# Patient Record
Sex: Female | Born: 1965 | Race: White | Hispanic: No | Smoking: Current every day smoker
Health system: Southern US, Community
[De-identification: ages and names within clinical notes are randomized; demographics above are authoritative.]

## PROBLEM LIST (undated history)

## (undated) ENCOUNTER — Emergency Department (HOSPITAL_COMMUNITY): Admission: EM | Payer: Self-pay | Source: Home / Self Care

## (undated) DIAGNOSIS — R0602 Shortness of breath: Secondary | ICD-10-CM

## (undated) DIAGNOSIS — M25519 Pain in unspecified shoulder: Secondary | ICD-10-CM

## (undated) DIAGNOSIS — J449 Chronic obstructive pulmonary disease, unspecified: Secondary | ICD-10-CM

## (undated) DIAGNOSIS — F32A Depression, unspecified: Secondary | ICD-10-CM

## (undated) DIAGNOSIS — K2970 Gastritis, unspecified, without bleeding: Secondary | ICD-10-CM

## (undated) DIAGNOSIS — N289 Disorder of kidney and ureter, unspecified: Secondary | ICD-10-CM

## (undated) DIAGNOSIS — K589 Irritable bowel syndrome without diarrhea: Secondary | ICD-10-CM

## (undated) DIAGNOSIS — I10 Essential (primary) hypertension: Secondary | ICD-10-CM

## (undated) DIAGNOSIS — IMO0002 Reserved for concepts with insufficient information to code with codable children: Secondary | ICD-10-CM

## (undated) DIAGNOSIS — N189 Chronic kidney disease, unspecified: Secondary | ICD-10-CM

## (undated) DIAGNOSIS — R569 Unspecified convulsions: Secondary | ICD-10-CM

## (undated) DIAGNOSIS — K219 Gastro-esophageal reflux disease without esophagitis: Secondary | ICD-10-CM

## (undated) DIAGNOSIS — K222 Esophageal obstruction: Secondary | ICD-10-CM

## (undated) DIAGNOSIS — K279 Peptic ulcer, site unspecified, unspecified as acute or chronic, without hemorrhage or perforation: Secondary | ICD-10-CM

## (undated) DIAGNOSIS — F419 Anxiety disorder, unspecified: Secondary | ICD-10-CM

## (undated) DIAGNOSIS — F329 Major depressive disorder, single episode, unspecified: Secondary | ICD-10-CM

## (undated) DIAGNOSIS — B9681 Helicobacter pylori [H. pylori] as the cause of diseases classified elsewhere: Secondary | ICD-10-CM

## (undated) HISTORY — PX: UPPER GASTROINTESTINAL ENDOSCOPY: SHX188

## (undated) HISTORY — DX: Chronic obstructive pulmonary disease, unspecified: J44.9

## (undated) HISTORY — DX: Peptic ulcer, site unspecified, unspecified as acute or chronic, without hemorrhage or perforation: K27.9

## (undated) HISTORY — DX: Gastritis, unspecified, without bleeding: K29.70

## (undated) HISTORY — DX: Major depressive disorder, single episode, unspecified: F32.9

## (undated) HISTORY — PX: ABDOMINAL HYSTERECTOMY: SHX81

## (undated) HISTORY — PX: CHOLECYSTECTOMY: SHX55

## (undated) HISTORY — DX: Gastro-esophageal reflux disease without esophagitis: K21.9

## (undated) HISTORY — DX: Esophageal obstruction: K22.2

## (undated) HISTORY — PX: OOPHORECTOMY: SHX86

## (undated) HISTORY — DX: Helicobacter pylori (H. pylori) as the cause of diseases classified elsewhere: B96.81

## (undated) HISTORY — PX: ESOPHAGOGASTRODUODENOSCOPY: SHX1529

## (undated) HISTORY — DX: Irritable bowel syndrome, unspecified: K58.9

## (undated) HISTORY — DX: Reserved for concepts with insufficient information to code with codable children: IMO0002

## (undated) HISTORY — DX: Anxiety disorder, unspecified: F41.9

## (undated) HISTORY — DX: Essential (primary) hypertension: I10

## (undated) HISTORY — DX: Depression, unspecified: F32.A

## (undated) HISTORY — DX: Pain in unspecified shoulder: M25.519

## (undated) HISTORY — PX: OTHER SURGICAL HISTORY: SHX169

## (undated) HISTORY — PX: BLADDER SURGERY: SHX569

---

## 2001-05-11 ENCOUNTER — Encounter: Payer: Self-pay | Admitting: General Surgery

## 2001-05-11 ENCOUNTER — Ambulatory Visit (HOSPITAL_COMMUNITY): Admission: RE | Admit: 2001-05-11 | Discharge: 2001-05-11 | Payer: Self-pay | Admitting: General Surgery

## 2001-10-30 ENCOUNTER — Encounter: Payer: Self-pay | Admitting: Emergency Medicine

## 2001-10-30 ENCOUNTER — Emergency Department (HOSPITAL_COMMUNITY): Admission: EM | Admit: 2001-10-30 | Discharge: 2001-10-30 | Payer: Self-pay | Admitting: Emergency Medicine

## 2005-06-14 ENCOUNTER — Inpatient Hospital Stay (HOSPITAL_COMMUNITY): Admission: EM | Admit: 2005-06-14 | Discharge: 2005-06-18 | Payer: Self-pay | Admitting: Emergency Medicine

## 2005-06-20 ENCOUNTER — Emergency Department (HOSPITAL_COMMUNITY): Admission: EM | Admit: 2005-06-20 | Discharge: 2005-06-20 | Payer: Self-pay | Admitting: Emergency Medicine

## 2006-08-10 ENCOUNTER — Emergency Department (HOSPITAL_COMMUNITY): Admission: EM | Admit: 2006-08-10 | Discharge: 2006-08-10 | Payer: Self-pay | Admitting: Emergency Medicine

## 2007-03-29 ENCOUNTER — Emergency Department (HOSPITAL_COMMUNITY): Admission: EM | Admit: 2007-03-29 | Discharge: 2007-03-29 | Payer: Self-pay | Admitting: Emergency Medicine

## 2008-01-06 ENCOUNTER — Emergency Department (HOSPITAL_COMMUNITY): Admission: EM | Admit: 2008-01-06 | Discharge: 2008-01-06 | Payer: Self-pay | Admitting: Emergency Medicine

## 2008-05-31 ENCOUNTER — Emergency Department (HOSPITAL_COMMUNITY): Admission: EM | Admit: 2008-05-31 | Discharge: 2008-05-31 | Payer: Self-pay | Admitting: Emergency Medicine

## 2009-02-02 ENCOUNTER — Emergency Department (HOSPITAL_COMMUNITY): Admission: EM | Admit: 2009-02-02 | Discharge: 2009-02-02 | Payer: Self-pay | Admitting: Emergency Medicine

## 2009-03-21 ENCOUNTER — Emergency Department (HOSPITAL_COMMUNITY): Admission: EM | Admit: 2009-03-21 | Discharge: 2009-03-21 | Payer: Self-pay | Admitting: Emergency Medicine

## 2009-04-20 ENCOUNTER — Emergency Department (HOSPITAL_COMMUNITY): Admission: EM | Admit: 2009-04-20 | Discharge: 2009-04-20 | Payer: Self-pay | Admitting: Emergency Medicine

## 2009-06-19 ENCOUNTER — Ambulatory Visit (HOSPITAL_COMMUNITY): Admission: RE | Admit: 2009-06-19 | Discharge: 2009-06-19 | Payer: Self-pay | Admitting: Family Medicine

## 2009-06-20 ENCOUNTER — Ambulatory Visit: Payer: Self-pay | Admitting: Orthopedic Surgery

## 2009-06-20 ENCOUNTER — Emergency Department (HOSPITAL_COMMUNITY): Admission: EM | Admit: 2009-06-20 | Discharge: 2009-06-20 | Payer: Self-pay | Admitting: Emergency Medicine

## 2009-06-20 DIAGNOSIS — Z8679 Personal history of other diseases of the circulatory system: Secondary | ICD-10-CM | POA: Insufficient documentation

## 2009-06-20 DIAGNOSIS — M7580 Other shoulder lesions, unspecified shoulder: Secondary | ICD-10-CM

## 2009-06-27 ENCOUNTER — Encounter: Payer: Self-pay | Admitting: Orthopedic Surgery

## 2009-06-29 ENCOUNTER — Telehealth: Payer: Self-pay | Admitting: Orthopedic Surgery

## 2009-09-07 ENCOUNTER — Ambulatory Visit: Payer: Self-pay | Admitting: Orthopedic Surgery

## 2009-09-07 ENCOUNTER — Emergency Department (HOSPITAL_COMMUNITY): Admission: EM | Admit: 2009-09-07 | Discharge: 2009-09-07 | Payer: Self-pay | Admitting: Emergency Medicine

## 2009-09-07 DIAGNOSIS — S139XXA Sprain of joints and ligaments of unspecified parts of neck, initial encounter: Secondary | ICD-10-CM

## 2009-10-04 DIAGNOSIS — B9681 Helicobacter pylori [H. pylori] as the cause of diseases classified elsewhere: Secondary | ICD-10-CM

## 2009-10-04 HISTORY — DX: Helicobacter pylori (H. pylori) as the cause of diseases classified elsewhere: B96.81

## 2009-10-09 ENCOUNTER — Ambulatory Visit: Payer: Self-pay | Admitting: Gastroenterology

## 2009-10-09 DIAGNOSIS — R1310 Dysphagia, unspecified: Secondary | ICD-10-CM

## 2009-10-09 DIAGNOSIS — Z8719 Personal history of other diseases of the digestive system: Secondary | ICD-10-CM | POA: Insufficient documentation

## 2009-10-09 DIAGNOSIS — Z8711 Personal history of peptic ulcer disease: Secondary | ICD-10-CM

## 2009-10-09 DIAGNOSIS — K219 Gastro-esophageal reflux disease without esophagitis: Secondary | ICD-10-CM

## 2009-10-19 ENCOUNTER — Ambulatory Visit (HOSPITAL_COMMUNITY): Admission: RE | Admit: 2009-10-19 | Discharge: 2009-10-19 | Payer: Self-pay | Admitting: Gastroenterology

## 2009-10-19 ENCOUNTER — Ambulatory Visit: Payer: Self-pay | Admitting: Gastroenterology

## 2009-10-26 ENCOUNTER — Ambulatory Visit (HOSPITAL_COMMUNITY): Admission: RE | Admit: 2009-10-26 | Discharge: 2009-10-26 | Payer: Self-pay | Admitting: Gastroenterology

## 2009-10-26 ENCOUNTER — Ambulatory Visit: Payer: Self-pay | Admitting: Gastroenterology

## 2009-11-23 ENCOUNTER — Ambulatory Visit (HOSPITAL_COMMUNITY): Admission: RE | Admit: 2009-11-23 | Discharge: 2009-11-23 | Payer: Self-pay | Admitting: Family Medicine

## 2009-11-23 ENCOUNTER — Encounter: Payer: Self-pay | Admitting: Orthopedic Surgery

## 2009-11-29 ENCOUNTER — Telehealth: Payer: Self-pay | Admitting: Orthopedic Surgery

## 2009-11-29 ENCOUNTER — Emergency Department (HOSPITAL_COMMUNITY): Admission: EM | Admit: 2009-11-29 | Discharge: 2009-11-29 | Payer: Self-pay | Admitting: Emergency Medicine

## 2009-12-05 ENCOUNTER — Emergency Department (HOSPITAL_COMMUNITY)
Admission: EM | Admit: 2009-12-05 | Discharge: 2009-12-05 | Payer: Self-pay | Source: Home / Self Care | Admitting: Emergency Medicine

## 2010-03-13 ENCOUNTER — Ambulatory Visit: Payer: Self-pay | Admitting: Gastroenterology

## 2010-03-13 DIAGNOSIS — R109 Unspecified abdominal pain: Secondary | ICD-10-CM | POA: Insufficient documentation

## 2010-03-14 ENCOUNTER — Telehealth (INDEPENDENT_AMBULATORY_CARE_PROVIDER_SITE_OTHER): Payer: Self-pay

## 2010-03-14 ENCOUNTER — Encounter: Payer: Self-pay | Admitting: Gastroenterology

## 2010-03-16 ENCOUNTER — Encounter: Payer: Self-pay | Admitting: Gastroenterology

## 2010-04-05 ENCOUNTER — Ambulatory Visit (HOSPITAL_COMMUNITY): Admission: RE | Admit: 2010-04-05 | Payer: Self-pay | Admitting: Gastroenterology

## 2010-05-27 ENCOUNTER — Encounter: Payer: Self-pay | Admitting: Internal Medicine

## 2010-06-05 ENCOUNTER — Encounter: Payer: Self-pay | Admitting: Internal Medicine

## 2010-06-05 ENCOUNTER — Ambulatory Visit
Admission: RE | Admit: 2010-06-05 | Discharge: 2010-06-05 | Payer: Self-pay | Source: Home / Self Care | Attending: Urgent Care | Admitting: Urgent Care

## 2010-06-05 DIAGNOSIS — R197 Diarrhea, unspecified: Secondary | ICD-10-CM | POA: Insufficient documentation

## 2010-06-07 NOTE — Assessment & Plan Note (Signed)
Summary: HAVING BLACK STOOLS/LAW   Visit Type:  Follow-up Visit Primary Care Provider:  Vertis Kelch  CC:  Black Stools.  History of Present Illness: Yolanda Adams is a pleasant 45 year old WF who presents in follow-up from an EGD/ED from June 2011. She had complaints of epigastric pain and dysphagia; EGD/ED was done 6/16 and 6/23, patchy erythema in entire body of stomach, no erosions, +H.pylori. Completed course of pylera, now on prilosec daily. denies dysphagia, denies reflux. continues to c/o upper diffuse abdominal pain intermittently, not associated or exacerbated by eating/drinking. "stabbing". Also continues to c/o postprandial loose, watery stools, which she states has gone on for "years". Refused TCS at last visit but now "is ready to try anything". Reported "black" stools. Was prescribed dicyclomine in the summer prior to egd but states "didn't help". requesting narcotics. was informed we would not dispense any. also concerned she has lost weight since june. down 6 lbs (from 168 to 162).    Current Medications (verified): 1)  Tramadol Hcl 50 Mg Tabs (Tramadol Hcl) .... One By Mouth Q 6 Hrs As Needed Pain 2)  Seroquel 100 Mg Tabs (Quetiapine Fumarate) .... Take 1 Tab By Mouth At Bedtime 3)  Alprazolam 1 Mg Tabs (Alprazolam) .... Take 1 Tablet By Mouth Four Times A Day 4)  Albuterol Inhaler .... As Directed 5)  Combivent 18-103 Mcg/act Aero (Ipratropium-Albuterol) .... As Directed 6)  Tylenol Arthritis Pain 650 Mg Cr-Tabs (Acetaminophen) .... As Needed 7)  Hydrochlorothiazide 25 Mg Tabs (Hydrochlorothiazide) .... 1/2 By Mouth Daily 8)  Prilosec Otc 20 Mg Tbec (Omeprazole Magnesium) .... One By Mouth 30 Mins Before Breakfast Daily 9)  Zoloft 50 Mg Tabs (Sertraline Hcl) .... Take 1 Tablet By Mouth Once A Day  Allergies (verified): No Known Drug Allergies  Past History:  Past Medical History: GERD H/O PUD Anxiety Depression Back pain/DDD COPD Shoulder pain Hypertension  6/16  and 6/23: EGD/ED: Distal esophageal stricture.  No evidence of Barrett's mass,     erosion, or ulceration.  The distal esophagus was dilated 15-16 mm.     The 17-mm dilator would not pass the upper esophagus without severe     resistance. 2. Patchy erythema in the entire body of the stomach.  Unable to     appreciate any erosions. 3. Normal duodenal bulb and second portion of the duodenum.  Review of Systems General:  Denies fever, chills, and anorexia. Eyes:  Denies blurring, diplopia, and discharge. ENT:  Denies sore throat, hoarseness, and difficulty swallowing. CV:  Denies chest pains, angina, and palpitations. Resp:  Denies dyspnea at rest, cough, and wheezing. GI:  Complains of abdominal pain, diarrhea, and black BMs; denies difficulty swallowing, pain on swallowing, and nausea. GU:  Denies urinary burning, blood in urine, and urinary frequency. MS:  Denies joint pain / LOM, joint swelling, and joint stiffness. Derm:  Denies rash, itching, and dry skin. Neuro:  Denies weakness, syncope, and frequent headaches. Psych:  Denies depression and anxiety. Endo:  Denies cold intolerance and heat intolerance.  Vital Signs:  Patient profile:   45 year old female Height:      62.5 inches Weight:      162 pounds BMI:     29.26 Pulse rate:   64 / minute BP sitting:   110 / 64  (left arm) Cuff size:   regular  Vitals Entered By: Cloria Spring LPN (March 13, 2010 2:24 PM)  Physical Exam  General:  Well developed, well nourished, no acute distress. Eyes:  sclera without icterus Mouth:  No deformity or lesions, dentition normal. Lungs:  Clear throughout to auscultation. Heart:  Regular rate and rhythm; no murmurs, rubs,  or bruits. Abdomen:  normal bowel sounds, obese, TTP upper abdomen, without guarding, without rebound, no masses, and no hepatomegally or splenomegaly.   Msk:  Symmetrical with no gross deformities. Normal posture. Extremities:  No clubbing, cyanosis, edema or  deformities noted. Neurologic:  Alert and  oriented x4;  grossly normal neurologically. Psych:  Alert and cooperative. Normal mood and affect.  Impression & Recommendations:  Problem # 1:  ABDOMINAL PAIN, UNSPECIFIED SITE (ICD-54.20)  45 year old WF who underwent EGD/ED secondary to dysphagia, epigastric pain in June 2011 on 6/16 and repeat dilation 6/23. +H.pylori, finished treatment. Denies dysphagia but continues to complain of diffuse upper abdominal pain intermittently, not associated or exacerbated by eating/drinking. "stabbing". unsure if relieved by BM. States comes without warning. Requested narcotics. Refused dicyclomine. Takes PPI reportedly daily.  No narcotics H.pylori stool antigen  Consider CT abd/pelvis   Orders: Est. Patient Level III (78469)  Problem # 2:  IRRITABLE BOWEL SYNDROME, HX OF (ICD-V12.79)  Hx of IBS, c/o pp loose, watery stools for "years". Refused TCS at last visit but agreeable now. Due to hx of difficult sedation, will have done in OR. Does not want to continue dicyclomine, states didn't work before. See #1 regarding narcotics.  TCS in OR with Dr. Darrick Penna, the risks/benefits have been discussed in detail and she states understanding, gave verbal consent.   Orders: Est. Patient Level III (62952)

## 2010-06-07 NOTE — Assessment & Plan Note (Signed)
Summary: EVAL/TREAT RT SHOULDER PAIN/NEEDS XRAY/REF D.ALLEN/100%MC DIS...   Visit Type:  Initial Consult Referring Provider:   [facility]  CC:  right shoulder pain.  History of Present Illness: I saw Yolanda Adams in the office today for an initial visit.  She is a 45 years old woman with the complaint of:  RIGHT SHOULDER PAIN.  Medications: Xanax, Serquel, Zantac, Tylenol Arthritis, Comvivent, Gliqelique.  Xrays at [facility] on 06-19-09.  x-rays were normal  She complains of previous dislocation of the RIGHT shoulder.  Complains of sharp 7 burning pain which is a 10 it is constant continuous she can't sleep on her RIGHT side.  She says nothing makes it better it's worse when she tries to move it  She did not circle symptoms of numbness tingling locking catching or swelling    Past History:  Past Medical History: Heartburn Anxiety Depression High blood pressure Peptic Ulcers Back pain COPD Shoulder pain  Past Surgical History: Cholecystectomy Right ovary removed  Review of Systems General:  Complains of fatigue; denies weight loss, weight gain, fever, and chills; easy bruising. Cardiac :  Complains of chest pain; denies angina, heart attack, heart failure, poor circulation, blood clots, and phlebitis. Resp:  Complains of short of breath; denies difficulty breathing, COPD, cough, and pneumonia; wheezing, coughing, snoring. GI:  Complains of nausea and diarrhea; denies vomiting, constipation, difficulty swallowing, ulcers, GERD, and reflux; heartburn. GU:  Denies kidney failure, kidney transplant, kidney stones, burning, poor stream, testicular cancer, blood in urine, and . Neuro:  Denies headache, dizziness, migraines, numbness, weakness, tremor, and unsteady walking. MS:  Complains of joint pain; denies rheumatoid arthritis, joint swelling, gout, bone cancer, osteoporosis, and ; muscle pain. Endo:  Denies thyroid disease, goiter, and diabetes. Psych:  Complains of depression  and anxiety; denies mood swings, panic attack, bipolar, and schizophrenia; nervousness. Derm:  Denies eczema, cancer, and itching. EENT:  Denies poor vision, cataracts, glaucoma, poor hearing, vertigo, ears ringing, sinusitis, hoarseness, toothaches, and bleeding gums. Immunology:  Denies seasonal allergies, sinus problems, and allergic to bee stings. Lymphatic:  Denies lymph node cancer and lymph edema.  Physical Exam  Additional Exam:  Vital signs are as recorded stable  Her appearance was normal  Cardiovascular exam no swelling or varicose veins pulsed in temperature normal no edema tenderness in the upper extremities  Cervical lymph node were normal  The patient was normal  RIGHT shoulder was extremely tender tender and hypersensitive.  She seemed to exacerbate her symptoms.  Her range of motion is limited to 45 of external rotation 80 of abduction and 40 of forward flexion.  I cannot test stability although sulcus sign testing normal her skin was intact multiple blockages that she said was from not going to the attending bed  Reflex recall in both elbows and wrists normal sensation is noted distally   Oriented to person place and time  Mood anxious   Impression & Recommendations:  Problem # 1:  IMPINGEMENT SYNDROME (ICD-726.2) Assessment New  x-rays were normal I saw all of them there were 3 of them  If anything she is exacerbating her symptoms but may have some bursitis or tendinitis in the rotator cuff  Recommend subacromial injection  Naprosyn  Physical therapy.  No followup needed.  Medications Added to Medication List This Visit: 1)  Naprosyn 500 Mg Tabs (Naproxen) .Marland Kitchen.. 1 by mouth two times a day  Other Orders: New Patient Level III (40981) Joint Aspirate / Injection, Large (20610) Depo- Medrol 40mg  (J1030)  Patient Instructions: 1)  You have received an injection of cortisone today. You may experience increased pain at the injection site. Apply ice  pack to the area for 20 minutes every 2 hours and take 2 xtra strength tylenol every 8 hours. This increased pain will usually resolve in 24 hours. The injection will take effect in 3-10 days.  2)  Physical Therapy for right shoulder bursitis and stiff shoulder  3)  the heaelth department will arrange f/u for your back pain and provide any other pain medication that you may need. 4)  Please schedule a follow-up appointment as needed. Prescriptions: NAPROSYN 500 MG TABS (NAPROXEN) 1 by mouth two times a day  #60 x 1   Entered and Authorized by:   Fuller Canada MD   Signed by:   Fuller Canada MD on 06/20/2009   Method used:   Print then Give to Patient   RxID:   [id number]   Appended Document: EVAL/TREAT RT SHOULDER PAIN/NEEDS XRAY/REF D.ALLEN/100%MC DIS... Verbal consent obtained/The shoulder was injected with depomedrol 40mg /cc and sensorcaine .25% . There were no complications

## 2010-06-07 NOTE — Assessment & Plan Note (Signed)
Summary: reck shoulder req injection may need new xr/mc disc/bsf   Visit Type:  Follow-up Referring Provider:   [facility]  CC:  recheck shoulder.  History of Present Illness: I saw Yolanda Adams in the office today for an initial visit.  She is a 45 years old woman with the complaint of:  RIGHT SHOULDER PAIN, Impingement syndrome  Medications: Xanax, Seroquel, Zantac, Tylenol Arthritis, Comvivent, Gliqelique, Prednisone for COPD, albuterol inhaler, Doxycycline for infection.  Xrays at [facility] on 06-19-09.  x-rays abnormal [facility] JOINT   She is here today requesting injection right shoulder.  Was noncompliant about therapy, did not go.  Naprosyn did not help.  Had injection 06/20/09 in our office.  data she seems to be exacerbating her symptoms but she complains more of neck pain now comes in with her RIGHT shoulder can't stop complaining of severe pain in the RIGHT shoulder with radiation into the RIGHT hand          Past History:  Past Medical History: Last updated: 06/20/2009 Heartburn Anxiety Depression High blood pressure Peptic Ulcers Back pain COPD Shoulder pain  Past Surgical History: Last updated: 06/20/2009 Cholecystectomy Right ovary removed  Physical Exam  Additional Exam:  tenderness in the cervical spine especially in the RIGHT side of it with trapezial tenderness decreased range of motion increased muscle tone is consistent with muscle spasm normal neurologic exam in the RIGHT upper extremity normal reflexes normal grip strength.     Impression & Recommendations:  Problem # 1:  IMPINGEMENT SYNDROME (ICD-726.2) Assessment Comment Only  xrays of the c spine mild spur ? spasm curvature looks pretty good not much dimension here on the x-ray there might be a spur mild joint spaces look good curve looks good impression mild spur otherwise normal cervical spine  Perhaps she has a disc problem perhaps a pinched nerve recommend Robaxin to work on the  spasm no pain medicines she really wanted a pain pill and we  declined  Orders: Est. Patient Level III (81191)  Problem # 2:  CERVICAL SPASM (ICD-847.0) Assessment: New  Her updated medication list for this problem includes:    Naprosyn 500 Mg Tabs (Naproxen) .Marland Kitchen... 1 by mouth two times a day    Tramadol Hcl 50 Mg Tabs (Tramadol hcl) ..... One by mouth q 6 hrs as needed pain    Robaxin 500 Mg Tabs (Methocarbamol) .Marland Kitchen... 1 q 6 as needed pain  Orders: Est. Patient Level III (47829) Cervical x-ray 2/3 views (56213)  Medications Added to Medication List This Visit: 1)  Robaxin 500 Mg Tabs (Methocarbamol) .Marland Kitchen.. 1 q 6 as needed pain  Patient Instructions: 1)  Take robaxin for pain  2)  use heat on the neck  3)  no follow up needed  Prescriptions: ROBAXIN 500 MG TABS (METHOCARBAMOL) 1 q 6 as needed pain  #60 x 2   Entered and Authorized by:   Fuller Canada MD   Signed by:   Fuller Canada MD on 09/07/2009   Method used:   Print then Give to Patient   RxID:   [id number]

## 2010-06-07 NOTE — Assessment & Plan Note (Signed)
Summary: hx of Peptic Ulcers-cdg   Visit Type:  Initial Consult Referring Provider:    Primary Care Provider:  Vertis Kelch  Chief Complaint:  heart burn/abd pain.  History of Present Illness: Yolanda Adams is a pleasant 45 y/o WF, who presents today for further evaluation of heartburn and abd pain. She states she had EGD years ago by Dr. Elpidio Anis and was found to have ulcers. She's been on Zantac off/on for years. She was told to avoid ASA/NSAIDS. Over the past two months, she has increasing epigastric pain. She finds holding a pillow on her stomach helps. She c/o pain associated with eating greasy or spicey foods. She c/o intractable heartburn. At night, she sleeps sitting up. She has problems swallowing solids foods and c/o odynophagia. She c/o pp loose stools which she has had for years. Used to take levsin in past but cannot afford. Denies brbpr. ?black stools lately. She takes ultram for her back. Used to be on percocet before Dr. Katrinka Blazing left town.   Current Medications (verified): 1)  Tramadol Hcl 50 Mg Tabs (Tramadol Hcl) .... One By Mouth Q 6 Hrs As Needed Pain 2)  Seroquel 100 Mg Tabs (Quetiapine Fumarate) .... Take 1 Tab By Mouth At Bedtime 3)  Alprazolam 1 Mg Tabs (Alprazolam) .... Take 1 Tablet By Mouth Four Times A Day 4)  Albuterol Inhaler .... As Directed 5)  Combivent 18-103 Mcg/act Aero (Ipratropium-Albuterol) .... As Directed 6)  Zantac 150 Mg Tabs (Ranitidine Hcl) .... Take 1 Tablet By Mouth Two Times A Day 7)  Tylenol Arthritis Pain 650 Mg Cr-Tabs (Acetaminophen) .... As Needed 8)  Hydrochlorothiazide 25 Mg Tabs (Hydrochlorothiazide) .... 1/2 By Mouth Daily  Allergies (verified): No Known Drug Allergies  Past History:  Past Medical History: GERD H/O PUD Anxiety Depression Back pain/DDD COPD Shoulder pain Hypertension  Family History: Father, PUD, partial gastrectomy No FH of CRC, liver disease  Social History: Single. Lives with Father. Trying to get  disability. Grown dgt, age 59. 1/2ppd (down from 2ppd two years ago). No alcohol.   Review of Systems General:  Denies fever, chills, sweats, anorexia, fatigue, and weight loss. Eyes:  Denies vision loss. ENT:  Complains of difficulty swallowing; denies nasal congestion, sore throat, and hoarseness. CV:  Complains of dyspnea on exertion; denies chest pains, angina, palpitations, and peripheral edema. Resp:  Complains of dyspnea with exercise and cough; denies dyspnea at rest, sputum, and wheezing. GI:  See HPI. GU:  Denies urinary burning, blood in urine, and abnormal vaginal bleeding. MS:  Complains of low back pain. Derm:  Denies rash and itching. Neuro:  Denies weakness, frequent headaches, memory loss, and confusion. Psych:  Complains of anxiety; denies depression and suicidal ideation. Endo:  Denies unusual weight change. Heme:  Denies bruising and bleeding. Allergy:  Denies hives and rash.  Vital Signs:  Patient profile:   45 year old female Height:      62.5 inches Weight:      168 pounds BMI:     30.35 Temp:     97.4 degrees F oral Pulse rate:   96 / minute BP sitting:   120 / 78  (left arm) Cuff size:   regular  Vitals Entered By: Cloria Spring LPN (October 10, 8655 11:12 AM)  Physical Exam  General:  Well developed, well nourished, no acute distress. Head:  Normocephalic and atraumatic. Eyes:  Conjunctivae pink, no scleral icterus.  Mouth:  Oropharyngeal mucosa moist, pink.  No lesions, erythema or exudate.  Neck:  Supple; no masses or thyromegaly. Lungs:  Clear throughout to auscultation. Heart:  Regular rate and rhythm; no murmurs, rubs,  or bruits. Abdomen:  Soft. Obese. Positive BS. Mild diffuse upper abd tenderness. No rebound or guarding. No HSM or masses. No abd bruit or hernia. Extremities:  No clubbing, cyanosis, edema or deformities noted. Neurologic:  Alert and  oriented x4;  grossly normal neurologically. Skin:  Intact without significant lesions or  rashes. Cervical Nodes:  No significant cervical adenopathy. Psych:  anxious.    Impression & Recommendations:  Problem # 1:  EPIGASTRIC PAIN (ICD-789.06)  Two month h/o worsening epigatric pain worse with certain foods. Also with intractable heartburn, dysphagia, odynophagia. H/O remote PUD, ?secondary to NSAIDS/ASA before. Stop Zantac. Begin Prilosec OTC 20mg  by mouth daily, #20 given. EGD in OR in near future. Patient with h/o inadequate conscious sedation with prior EGD, polypharmacy, high anxiety level. EGD/ED to be performed in near future.  Risks, alternatives, benefits including but not limited to risk of reaction to medications, bleeding, infection, and perforation addressed.  Patient voiced understanding and verbal consent obtained.   Patient asked for "pain medication" for her stomach pain. She mentioned not being about to get narcotic from the Health Dept[facility]ental Health. I advised her that we would start PPI and plan for EGD. No narcotic to be given.  Orders: New Patient Level III (16109)  Problem # 2:  IRRITABLE BOWEL SYNDROME, HX OF (ICD-V12.79)  Begin dicyclomine 10mg  by mouth three times a day (qac) as needed. Offered TCS, patient declines at this point. If symptoms do not improved with antispasmotic, recommend further w/u now. Patient agreeable.   Orders: New Patient Level III (60454)  Patient Instructions: 1)  Stop Zantac. 2)  Start Prilosec OTC one by mouth 30 mins before breakfast daily. #20 samples given to you today. 3)  Start dicyclomine 10mg  by mouth three times a day ( before meals) as needed loose stool, abdominal cramps. 4)  EGD (upper endoscopy) as scheduled. 5)  The medication list was reviewed and reconciled.  All changed / newly prescribed medications were explained.  A complete medication list was provided to the patient / caregiver. Prescriptions: DICYCLOMINE HCL 10 MG CAPS (DICYCLOMINE HCL) one by mouth three times a day (qac) as needed abd pain,  loose stools  #90 x 3   Entered and Authorized by:   Leanna Battles. Dixon Boos   Signed by:   Leanna Battles Quiana Cobaugh PA-C on 10/09/2009   Method used:   Electronically to        [facility] (retail)       [address]       [place], [place]  [address]       Ph: [phone]       Fax: [phone]   RxID:   [id number]   Appended Document: hx of Peptic Ulcers-cdg Agree w/ no narcotics.

## 2010-06-07 NOTE — Progress Notes (Signed)
Summary: tramadol called into [facility], pt advised  Phone Note Call from Patient   Summary of Call: Yolanda Adams (01-Nov-2065) says the Napsosyn is not helping the pain, wants to know if you will call in Tramadol or something else for the pain. Uses [facility] in [place] [facility] # [phone] Initial call taken by: Jacklynn Ganong,  June 29, 2009 1:34 PM  Follow-up for Phone Call        tramadol ok  Follow-up by: Fuller Canada MD,  June 30, 2009 7:37 AM    New/Updated Medications: TRAMADOL HCL 50 MG TABS (TRAMADOL HCL) one by mouth q 6 hrs as needed pain Prescriptions: TRAMADOL HCL 50 MG TABS (TRAMADOL HCL) one by mouth q 6 hrs as needed pain  #42 x 1   Entered by:   Ether Griffins   Authorized by:   Fuller Canada MD   Signed by:   Ether Griffins on 06/30/2009   Method used:   Faxed to ...       [facility] (retail)       [address]       [place], [place]  [address]       Ph: [phone]       Fax: [phone]   RxID:   [id number]

## 2010-06-07 NOTE — Letter (Signed)
Summary: TCS ORDER  TCS ORDER   Imported By: Ave Filter 03/13/2010 15:58:41  _____________________________________________________________________  External Attachment:    Type:   Image     Comment:   External Document  Appended Document: TCS ORDER Pt NO SHOWED for pre-op visit on 04/04/10. I called pt to remind her of her appt. She stated she would like to wait to have her procedure because she isn't feeling well. She will call us back to reschedule.

## 2010-06-07 NOTE — Miscellaneous (Signed)
Summary: PT order  PT order   Imported By: Cammie Sickle 08/16/2009 09:18:21  _____________________________________________________________________  External Attachment:    Type:   Image     Comment:   External Document

## 2010-06-07 NOTE — Letter (Signed)
Summary: EGD/ED ORDER  EGD/ED ORDER   Imported By: Ave Filter 10/19/2009 10:57:25  _____________________________________________________________________  External Attachment:    Type:   Image     Comment:   External Document

## 2010-06-07 NOTE — Letter (Signed)
Summary: EGD ORDER  EGD ORDER   Imported By: Ave Filter 10/09/2009 12:03:27  _____________________________________________________________________  External Attachment:    Type:   Image     Comment:   External Document

## 2010-06-07 NOTE — Progress Notes (Signed)
Summary: h pylori stool orders  ---- Converted from flag ---- ---- 03/13/2010 4:16 PM, Gerrit Halls NP wrote: Please have pt obtain stool sample for H.pylori stool antigen test.. I forgot to mention this to her at the office visit. ------------------------------ pt aware, will come to office to pick up order and container. Explained to pt the specimen needed to be frozen. order and container at front desk. pt stated understanding  Appended Document: h pylori stool orders pt never picked up stool container  Appended Document: h pylori stool orders pt called- wants to come by and pick up container for hpylori stool. left container at front desk.

## 2010-06-07 NOTE — Letter (Signed)
Summary: *Orthopedic Consult Note  [facility]  [address] [place] [address]  [place], [place] [address]   Phone: [phone]  Fax: [phone]    Re:    Yolanda Adams DOB:    Dec 26, 1965   Dear: providers at the health department   Thank you for requesting that we see the above patient for consultation.  A copy of the detailed office note will be sent under separate cover, for your review.  Evaluation today is consistent with: exacerbation of symptoms, possible bursitis, seeking pain medicine   Our recommendation is for: no narcotics, physical therapy, subacromial injection.  She is not a surgical candidate and does not have a surgical lesion.  If she needs treatment for her back pain please send her to either a chiropractor or neurosurgeon.   Thank you for this opportunity to look after your patient.  Sincerely,   Terrance Mass. MD.

## 2010-06-07 NOTE — Medication Information (Signed)
Summary: Tax adviser   Imported By: Cammie Sickle 08/21/2009 06:32:23  _____________________________________________________________________  External Attachment:    Type:   Image     Comment:   External Document

## 2010-06-07 NOTE — Letter (Signed)
Summary: Historic Patient File  Historic Patient File   Imported By: Elvera Maria 06/27/2009 11:26:09  _____________________________________________________________________  External Attachment:    Type:   Image     Comment:   history form

## 2010-06-07 NOTE — Letter (Signed)
Summary: *Orthopedic No Show Letter  [facility]  [address]  [place], [place] [address]   Phone: [phone]  Fax: [phone]      11/23/2009   Yolanda Adams [address] [place], [place]  [address]     Dear Ms. Jobson,   Our records indicate that you missed your scheduled appointment with Dr. Beaulah Corin on 11/22/09.  Please contact this office to reschedule your appointment as soon as possible.  It is important that you keep your scheduled appointments with your physician, in order to avoid any potential missed appointment charge, and so that we can provide you the best care possible.        Sincerely,    Dr. Terrance Mass, MD [facility] Phone [phone]

## 2010-06-07 NOTE — Miscellaneous (Signed)
Summary: Orders Update  Clinical Lists Changes  Orders: Added new Test order of T-Helicobactor Pylori Antigen Stool ([id number]) - Signed 

## 2010-06-07 NOTE — Letter (Signed)
Summary: AUTH FOR RELEASE OF MED INFO  AUTH FOR RELEASE OF MED INFO   Imported By: Rexene Alberts 03/16/2010 10:28:39  _____________________________________________________________________  External Attachment:    Type:   Image     Comment:   External Document

## 2010-06-07 NOTE — Progress Notes (Signed)
Summary: called patient to cancel appt, referred back to primary care  Phone Note Outgoing Call   Call placed to: Patient Summary of Call: Per Dr Romeo Apple and per last office note and per patient instruction sheet, patient is to follow up with [facility] Department for back/hip pain.  I have called patient, advised and cancelled the appointment for today. Initial call taken by: Cammie Sickle,  November 29, 2009 10:13 AM

## 2010-06-08 ENCOUNTER — Encounter (HOSPITAL_COMMUNITY): Payer: Self-pay | Attending: Internal Medicine

## 2010-06-08 ENCOUNTER — Other Ambulatory Visit: Payer: Self-pay | Admitting: Internal Medicine

## 2010-06-08 DIAGNOSIS — Z01812 Encounter for preprocedural laboratory examination: Secondary | ICD-10-CM | POA: Insufficient documentation

## 2010-06-08 LAB — BASIC METABOLIC PANEL
BUN: 6 mg/dL (ref 6–23)
CO2: 22 mEq/L (ref 19–32)
Calcium: 9.2 mg/dL (ref 8.4–10.5)
Chloride: 102 mEq/L (ref 96–112)
Creatinine, Ser: 0.83 mg/dL (ref 0.4–1.2)
GFR calc Af Amer: >60 mL/min (ref >60)
GFR calc non Af Amer: >60 mL/min (ref >60)
Glucose, Bld: 106 mg/dL — ABNORMAL HIGH (ref 70–99)
Potassium: 4.3 mEq/L (ref 3.5–5.1)
Sodium: 134 mEq/L — ABNORMAL LOW (ref 135–145)

## 2010-06-08 LAB — HCG, QUANTITATIVE, PREGNANCY: hCG, Beta Chain, Quant, S: 2 m[IU]/mL (ref <5)

## 2010-06-13 NOTE — Letter (Signed)
Summary: TCS ORDER  TCS ORDER   Imported By: Ave Filter 06/05/2010 12:14:50  _____________________________________________________________________  External Attachment:    Type:   Image     Comment:   External Document

## 2010-06-13 NOTE — Assessment & Plan Note (Signed)
Summary: PT TO SET UP TCS/CM   Visit Type:  Follow-up Visit Primary Care Provider:  Dr. Raliegh Scarlet [facility], Michell Heinrich)  Chief Complaint:  set up tcs and having some black specks in stool.  History of Present Illness: cc:  Vertis Kelch, NP  45 y/o caucasian female here to set up colonoscopy that was recommended previously and she admits to putting off due to other bladder problems.  c/o watery diarrhea, especially in AM and at night.  Seeing dark stools .  Hx IBS.  Taking PEPTO as needed.  c/o intermittant abd pain all over entire abd.  s/p cholecystectomy & right oopherectomy.  Diarrhea worse postprandially.  BM 5 per day. Pain worse with eating greasy or spicy foods. Failed dicyclomine previously.  Occ heartburn & epigastric pressure.  Preventive Screening-Counseling & Management      Drug Use:  no.    Current Problems (verified): 1)  Abdominal Pain, Unspecified Site  (ICD-789.00) 2)  Dysphagia Unspecified  (ICD-787.20) 3)  Irritable Bowel Syndrome, Hx of  (ICD-V12.79) 4)  Gerd  (ICD-530.81) 5)  Pud, Hx of  (ICD-V12.71) 6)  Cervical Spasm  (ICD-847.0) 7)  Impingement Syndrome  (ICD-726.2) 8)  High Blood Pressure  (ICD-V12.50)  Current Medications (verified): 1)  Tramadol Hcl 50 Mg Tabs (Tramadol Hcl) .... One By Mouth Q 6 Hrs As Needed Pain 2)  Seroquel 100 Mg Tabs (Quetiapine Fumarate) .... Take 1 Tab By Mouth At Bedtime 3)  Alprazolam 1 Mg Tabs (Alprazolam) .... Take 1 Tablet By Mouth Four Times A Day 4)  Albuterol Inhaler .... As Directed 5)  Combivent 18-103 Mcg/act Aero (Ipratropium-Albuterol) .... As Directed 6)  Tylenol Arthritis Pain 650 Mg Cr-Tabs (Acetaminophen) .... As Needed 7)  Hydrochlorothiazide 25 Mg Tabs (Hydrochlorothiazide) .... 1/2 By Mouth Daily 8)  Prilosec Otc 20 Mg Tbec (Omeprazole Magnesium) .... One By Mouth 30 Mins Before Breakfast Daily 9)  Zoloft 50 Mg Tabs (Sertraline Hcl) .... Take 1 Tablet By Mouth Once A Day 10)  Oxybutynin Chloride 5 Mg Tabs  (Oxybutynin Chloride) .... Three Times A Day 11)  Pyridium 100 Mg Tabs (Phenazopyridine Hcl) .... Three Times A Day  Allergies (verified): 1)  ! * Fluminine  Past History:  Past Medical History: Last updated: 03/13/2010 GERD H/O PUD Anxiety Depression Back pain/DDD COPD Shoulder pain Hypertension  6/16 and 6/23: EGD/ED: Distal esophageal stricture.  No evidence of Barrett's mass,     erosion, or ulceration.  The distal esophagus was dilated 15-16 mm.     The 17-mm dilator would not pass the upper esophagus without severe     resistance. 2. Patchy erythema in the entire body of the stomach.  Unable to     appreciate any erosions. 3. Normal duodenal bulb and second portion of the duodenum.  Past Surgical History: Cholecystectomy Right ovary removed 3 bladder surgeries Dr Lafayette Dragon in [place]  Family History: Reviewed history from 10/09/2009 and no changes required. Father (24) PUD, partial gastrectomy, prostate ca mother (74) healthy 4 siblings, 1 deceased MVA, Bipolar No FH of CRC, liver disease  Social History: Reviewed history from 10/09/2009 and no changes required. Single. Lives with Father. Trying to get disability. Grown dgt, age 67-healthy. 1/2ppd (down from 2ppd two years ago) x 27yrs. No alcohol.  Illicit Drug Use - no Drug Use:  no  Review of Systems General:  Complains of fatigue, weakness, and malaise; denies fever, chills, sweats, anorexia, and weight loss. ENT:  Complains of hoarseness; denies earache, ear discharge, tinnitus, decreased hearing,  nasal congestion, loss of smell, nosebleeds, sore throat, and difficulty swallowing; for several months. CV:  Denies chest pains, angina, palpitations, syncope, dyspnea on exertion, orthopnea, PND, peripheral edema, and claudication. Resp:  Denies dyspnea at rest, dyspnea with exercise, cough, sputum, wheezing, coughing up blood, and pleurisy. GI:  See HPI; Denies difficulty swallowing, pain on swallowing, vomiting  blood, and jaundice. GU:  Complains of urinary burning and urinary frequency; denies blood in urine, nocturnal urination, urinary incontinence, abnormal vaginal bleeding, amenorrhea, menorrhagia, vaginal discharge, pelvic pain, genital sores, painful intercourse, and decreased libido; recent bladder surgeries. MS:  Denies joint pain / LOM, joint swelling, joint stiffness, joint deformity, low back pain, muscle weakness, muscle cramps, muscle atrophy, leg pain at night, leg pain with exertion, and shoulder pain / LOM hand / wrist pain (CTS). Derm:  Denies rash, itching, dry skin, hives, moles, warts, and unhealing ulcers. Psych:  Denies depression, anxiety, memory loss, suicidal ideation, hallucinations, paranoia, phobia, and confusion. Heme:  Denies bruising, bleeding, and enlarged lymph nodes.  Vital Signs:  Patient profile:   45 year old female Height:      62.5 inches Weight:      161 pounds BMI:     29.08 Temp:     97.4 degrees F oral Pulse rate:   68 / minute BP sitting:   120 / 76  (left arm) Cuff size:   regular  Vitals Entered By: Hendricks Limes LPN (June 05, 2010 11:29 AM)  Physical Exam  General:  Well developed, well nourished, no acute distress. Head:  Normocephalic and atraumatic. Eyes:  Sclera clear, no icterus. Ears:  Normal auditory acuity. Nose:  No deformity, discharge,  or lesions. Mouth:  No deformity or lesions, dentition normal. +hoarse Neck:  Supple; no masses or thyromegaly. Lungs:  Clear throughout to auscultation. Heart:  Regular rate and rhythm; no murmurs, rubs,  or bruits. Abdomen:  Soft, nontender and nondistended. No masses, hepatosplenomegaly or hernias noted. Normal bowel sounds.without guarding and without rebound.   Msk:  Symmetrical with no gross deformities. Normal posture. Pulses:  Normal pulses noted. Extremities:  No clubbing, cyanosis, edema or deformities noted. Neurologic:  Alert and  oriented x4;  grossly normal neurologically. Skin:   Intact without significant lesions or rashes. Cervical Nodes:  No significant cervical adenopathy. Psych:  Alert and cooperative. Normal mood and affect.  Impression & Recommendations:  Problem # 1:  DIARRHEA, CHRONIC (ICD-787.91) 45 y/o caucasian female w/ chronic diarrhea and hx IBS. Previously tried dicyclomine, no relief.  Continues to have diarrhea & ready to have colonoscopy for further evaluation to r/o IBD, microscopic colitis, less likely colorectal ca.    Diagnostic colonoscopy to be performed by Dr. Suszanne Conners Rourk in the near future in the OR given hx polypharmacy.  I have discussed risks and benefits which include, but are not limited to, bleeding, infection, perforation, or medication reaction.  The patient agrees with this plan and consent will be obtained.    Orders: Est. Patient Level IV (56213)  Problem # 2:  IRRITABLE BOWEL SYNDROME, HX OF (ICD-V12.79) See #1  Problem # 3:  GERD (ICD-530.81) On prilosec daily, occasional breakthrough symptoms  Appended Document: PT TO SET UP TCS/CM LM for return call.   Need to know pt's LMP? Using contraception? Needs Urine Preg if has not permanent birth control prior to TCS. Thanks  Appended Document: PT TO SET UP TCS/CM spoke with pt- she has had 1 ovary removed only. Her LMP was last week. Informed  pt she will need urine pregnancy test and I would see if they will do it tomorrow at her pre-op appt. pt verbalized understanding. added urine pregnancy to orders and faxed to day surgery.

## 2010-06-14 ENCOUNTER — Encounter (INDEPENDENT_AMBULATORY_CARE_PROVIDER_SITE_OTHER): Payer: Self-pay | Admitting: *Deleted

## 2010-06-14 ENCOUNTER — Encounter: Payer: Self-pay | Admitting: Internal Medicine

## 2010-06-14 ENCOUNTER — Ambulatory Visit (HOSPITAL_COMMUNITY): Admission: RE | Admit: 2010-06-14 | Payer: Self-pay | Source: Ambulatory Visit | Admitting: Internal Medicine

## 2010-06-21 NOTE — Letter (Signed)
Summary: Radiology Test Reminder  [facility]  [address]   [place], [place] [address]   Phone: [phone]  Fax: [phone]     June 14, 2010   LYRIQUE HAKIM [address] [place], [place]  [address] 1965/10/02  Dear Ms. Kunz,  During your last appointment, your doctor requested you have Colonoscopy.  Our records indicate you have not had this done.  Remember it is very important to follow your doctor's instructions.  Please have this done as soon as possible.  If you have questions regarding this appointment, please call our office and we can reschedule this for you.  It is important that patients and their doctor work together in the management and treatment of their health care.  If you have already had your test done, please disregard this letter.  Thank you,    Ave Filter  [facility] Ph: [phone]   Fax: [phone]

## 2010-07-22 LAB — BASIC METABOLIC PANEL
BUN: 12 mg/dL (ref 6–23)
GFR calc Af Amer: >60 mL/min (ref >60)

## 2010-08-08 LAB — URINALYSIS, ROUTINE W REFLEX MICROSCOPIC
Bilirubin Urine: NEGATIVE
Hgb urine dipstick: NEGATIVE
Specific Gravity, Urine: 1.02 (ref 1.005–1.030)
pH: 5 (ref 5.0–8.0)

## 2010-08-08 LAB — URINE MICROSCOPIC-ADD ON

## 2010-08-20 LAB — RAPID URINE DRUG SCREEN, HOSP PERFORMED
Amphetamines: NOT DETECTED
Barbiturates: NOT DETECTED
Benzodiazepines: POSITIVE — AB
Opiates: NOT DETECTED

## 2010-09-21 NOTE — Consult Note (Signed)
Yolanda Adams, Yolanda Adams               ACCOUNT NO.:  [id number]   MEDICAL RECORD NO.:  [id number]          PATIENT TYPE:  INP   LOCATION:  A325                          FACILITY:  [facility]   PHYSICIAN:  Dennie Maizes, M.D.   DATE OF BIRTH:  Feb 05, 1966   DATE OF CONSULTATION:  DATE OF DISCHARGE:                                   CONSULTATION   REASON FOR CONSULTATION:  Acute right pyelonephritis.   HISTORY OF PRESENT ILLNESS:  This 45 year old female experienced sudden  onset of severe right flank pain as well as right upper quadrant abdominal  pain five days ago.  She denied having any fever, chills, voiding difficulty  at that time.  She had nausea.  She has urinary frequency x10-12 and  nocturia x2, usually.  She has not noticed any blood in the urine.   PAST MEDICAL HISTORY:  1.  Negative for urolithiasis.  2.  Chronic recurrent back pain due to degenerative disk disease.  3.  Insomnia with anxiety.  4.  Status post cholecystectomy.  5.  Status post excision of right ovary.   MEDICATIONS:  The patient had been started on IV Levaquin.   ALLERGIES:  None.   PHYSICAL EXAMINATION:  GENERAL APPEARANCE:  The patient is comfortable at  the time of my examination.  ABDOMEN:  Soft, no palpable flank mass.  Mild right flank, as well as, right  upper quadrant abdominal tenderness is noted.  Bladder not palpable.  No  suprapubic tenderness.   ADMISSION LABORATORY DATA:  CBC:  WBC 11.0, hemoglobin 13.3, hematocrit  38.8.  BUN 5, creatinine 0.9.  Urine pregnancy test negative.  Urinalysis:  Blood negative, nitrites positive, leukocyte esterase negative, WBC's 11-20  per high-powered field, bacteria many.  Urine culture and sensitivity has  been done.  Results are pending.  The patient is on IV Levaquin at present.   STUDIES:  The patient has been evaluated with CT of the abdomen and pelvis  without contrast initially.  This revealed small liver cysts.  Bilateral  renal scarring with  nonobstructing small stones in the upper poles of both  kidneys.  There was a subtle hyperechoic right renal  mass.  There was no  evidence of hydronephrosis or uretal calculi.  The hyperechoic mass in the  right kidney was evaluated further with MRI of the abdomen with and without  contrast.  MRI revealed 15 mm size non-enhancing lesion in the right kidney  suggestive of acute pyelonephritis.  This corresponds to the area of  abnormality noted on the CT scan.  Edema of the kidney with perinephric  fluid.  Appearance was suggestive of acute focal pyelonephritis and small  renal abscess.   IMPRESSION:  1.  Acute focal right pyelonephritis, possible small right renal abscess.      The patient is responding well clinically to IV Levaquin.  Continue IV      Levaquin.  2.  Await urine culture and sensitivity results.  3.  The patient can be discharged when she is pain free and clinically      stable.  Thanks for this  consult.  We will follow the patient in the      office in one week.      Dennie Maizes, M.D.  Electronically Signed     SK/MEDQ  D:  06/17/2005  T:  06/17/2005  Job:  [id number]   cc:   Madelin Rear. Sherwood Gambler, MD  Fax: [phone]

## 2010-09-21 NOTE — Discharge Summary (Signed)
[name], Yolanda Adams               ACCOUNT NO.:  [id number]   MEDICAL RECORD NO.:  [id number]          PATIENT TYPE:  INP   LOCATION:  A325                          FACILITY:  [facility]   PHYSICIAN:  Madelin Rear. Sherwood Gambler, MD  DATE OF BIRTH:  Sep 21, 1965   DATE OF ADMISSION:  06/14/2005  DATE OF DISCHARGE:  02/13/2007LH                                 DISCHARGE SUMMARY   DISCHARGE DIAGNOSES:  1.  Acute pyelonephritis.  2.  Back pain.  3.  Chronic back ache.   DISCHARGE MEDICATIONS:  1.  Xanax 1 mg four times a day.  2.  Tylox p.r.n. pain.  3.  Levaquin 750 mg p.o. daily.   HOSPITAL COURSE:  The patient was admitted with acute pyelonephritis.  She  was seen in consultation with urology when a CT revealed a questionable  renal abscess.  She was evaluated by urology for urinary incontinence and  followup as an outpatient would be for that.  Subsequently, the patient was  discharged after defervescence in office for follow up of her urinary tract  infection.      Madelin Rear. Sherwood Gambler, MD  Electronically Signed     LJF/MEDQ  D:  07/23/2005  T:  07/23/2005  Job:  [id number]

## 2010-09-21 NOTE — H&P (Signed)
Yolanda, Adams               ACCOUNT NO.:  [id number]   MEDICAL RECORD NO.:  [id number]          PATIENT TYPE:  EMS   LOCATION:  ED                            FACILITY:  [facility]   PHYSICIAN:  Madelin Rear. Sherwood Gambler, MD  DATE OF BIRTH:  [birth year]   DATE OF ADMISSION:  06/14/2005  DATE OF DISCHARGE:  LH                                HISTORY & PHYSICAL   CHIEF COMPLAINT:  Right upper quadrant pain.   HISTORY OF PRESENT ILLNESS:  The patient had sudden severe onset of right  upper quadrant and right sided flank pain.  She admitted to nausea but no  vomiting.  No fevers, rigors, or chills.  No frequency, urgency, dysuria, or  hematuria noted.   PAST MEDICAL HISTORY:  1.  Chronic recurrent back pain.  2.  Insomnia with anxiety.  3.  Status post cholecystectomy.   SOCIAL HISTORY:  Nonsmoker, nondrinker.  No alcohol or other drug use.   She is maintained on chronic opiate therapy for back pain.   FAMILY HISTORY:  Positive for coronary artery disease but otherwise  unremarkable.   REVIEW OF SYSTEMS:  As under HPI, all else negative.   PHYSICAL EXAMINATION:  SKIN:  Unremarkable.  HEAD/NECK:  No JVD or adenopathy.  Neck is supple.  CHEST:  Clear.  CARDIAC:  Regular rhythm without murmur, gallop, or rub.  Positive right  lower rib cage tenderness but much more marked in the right upper quadrant.  ABDOMEN:  There was no organomegaly or masses.  No guarding or rebound  tenderness.   LABORATORY:  Revealed a 11,000 white count, normal H&H, platelet count  normal.  Urinalysis positive for pyuria.  A CT scan was obtained and  reviewed with the radiologist real time.  There was a vague density in her  right kidney, upper pole that was not clearly delineated on the CT and will  require further assessment as outlined below.  Much more significantly,  there was no hydronephrosis or hydroureter.  No evidence of a renal  infarction at present.  There was no biliary tract dilatation or  pancreatic  or duodenal disease.  No evidence of perforated viscus.   IMPRESSION:  1.  Right flank pain, question etiology secondary to pyelonephritis.  We      will admit for IV antibiotics and      analgesia.  2.  Right kidney mass.  We will reassess with a MR as the optimal imaging      technique.  3.  Back pain.  We will address that with expectant observation and      intervene as needed.      Madelin Rear. Sherwood Gambler, MD  Electronically Signed     LJF/MEDQ  D:  06/14/2005  T:  06/14/2005  Job:  [id number]

## 2010-10-05 ENCOUNTER — Emergency Department (HOSPITAL_COMMUNITY)
Admission: EM | Admit: 2010-10-05 | Discharge: 2010-10-05 | Disposition: A | Discharge status: Home / Self Care | Payer: Self-pay | Attending: Emergency Medicine | Admitting: Emergency Medicine

## 2010-10-05 DIAGNOSIS — J4489 Other specified chronic obstructive pulmonary disease: Secondary | ICD-10-CM | POA: Insufficient documentation

## 2010-10-05 DIAGNOSIS — M545 Low back pain, unspecified: Secondary | ICD-10-CM | POA: Insufficient documentation

## 2010-10-05 DIAGNOSIS — J449 Chronic obstructive pulmonary disease, unspecified: Secondary | ICD-10-CM | POA: Insufficient documentation

## 2010-10-05 DIAGNOSIS — Z79899 Other long term (current) drug therapy: Secondary | ICD-10-CM | POA: Insufficient documentation

## 2010-10-05 DIAGNOSIS — I10 Essential (primary) hypertension: Secondary | ICD-10-CM | POA: Insufficient documentation

## 2010-11-27 ENCOUNTER — Telehealth: Payer: Self-pay

## 2010-11-27 NOTE — Telephone Encounter (Signed)
Pt called and said she has transportation problems. She lives in [place]. Dr. Teena Dunk is a new GI physician there and she would like a referral. She said Dr. Darrick Penna is a wonderful physician, but she has transportation issues and it would be so much easier for her to travel there. She said Dr. Starr Lake office will not see her unless she is referred From Dr.Fields.

## 2010-11-27 NOTE — Telephone Encounter (Signed)
Please fax records from 2011 and 2012 to Dr. Starr Lake ofc ASAP. Transfer of care ok.  Please call Ms. Weyenberg and let her know we will be faxing her records. We will be happy to continue her care until her first appt with Dr. Teena Dunk.

## 2010-11-27 NOTE — Telephone Encounter (Signed)
Pt informed. Darl Pikes working on the records.

## 2010-11-29 ENCOUNTER — Telehealth: Payer: Self-pay | Admitting: Gastroenterology

## 2010-11-30 NOTE — Telephone Encounter (Signed)
Will await form and fax to Dr. Starr Lake ofc.

## 2010-11-30 NOTE — Telephone Encounter (Signed)
Spoke with [employer]. Pt needs referral form filled out. Gave her my fax #: [phone].

## 2010-12-07 ENCOUNTER — Other Ambulatory Visit: Payer: Self-pay | Admitting: Neurosurgery

## 2010-12-07 DIAGNOSIS — M47816 Spondylosis without myelopathy or radiculopathy, lumbar region: Secondary | ICD-10-CM

## 2010-12-11 ENCOUNTER — Inpatient Hospital Stay: Admission: RE | Admit: 2010-12-11 | Payer: Self-pay | Source: Ambulatory Visit

## 2010-12-12 NOTE — Telephone Encounter (Signed)
Cc our records To Dr. Ottie Glazier office and Patient now has appt scheduled with there office for 12/27/10 @ 2:15

## 2010-12-12 NOTE — Telephone Encounter (Signed)
REFERRAL AND PAPERWORK FAXED.

## 2011-01-09 ENCOUNTER — Ambulatory Visit
Admission: RE | Admit: 2011-01-09 | Discharge: 2011-01-09 | Disposition: A | Discharge status: Home / Self Care | Payer: Self-pay | Source: Ambulatory Visit | Attending: Neurosurgery | Admitting: Neurosurgery

## 2011-01-09 DIAGNOSIS — M47816 Spondylosis without myelopathy or radiculopathy, lumbar region: Secondary | ICD-10-CM

## 2011-01-09 MED ORDER — IOHEXOL 180 MG/ML  SOLN
1.0000 mL | Freq: Once | INTRAMUSCULAR | Status: AC | PRN
  Administered 2011-01-09: 1 mL via EPIDURAL

## 2011-01-09 MED ORDER — METHYLPREDNISOLONE ACETATE 40 MG/ML INJ SUSP (RADIOLOG
120.0000 mg | Freq: Once | INTRAMUSCULAR | Status: AC
  Administered 2011-01-09: 120 mg via EPIDURAL

## 2011-01-29 ENCOUNTER — Encounter (HOSPITAL_COMMUNITY): Payer: Self-pay

## 2011-01-29 ENCOUNTER — Emergency Department (HOSPITAL_COMMUNITY)
Admission: EM | Admit: 2011-01-29 | Discharge: 2011-01-29 | Disposition: A | Discharge status: Home / Self Care | Payer: Self-pay | Attending: Emergency Medicine | Admitting: Emergency Medicine

## 2011-01-29 DIAGNOSIS — F17200 Nicotine dependence, unspecified, uncomplicated: Secondary | ICD-10-CM | POA: Insufficient documentation

## 2011-01-29 DIAGNOSIS — F411 Generalized anxiety disorder: Secondary | ICD-10-CM | POA: Insufficient documentation

## 2011-01-29 DIAGNOSIS — K219 Gastro-esophageal reflux disease without esophagitis: Secondary | ICD-10-CM | POA: Insufficient documentation

## 2011-01-29 DIAGNOSIS — N949 Unspecified condition associated with female genital organs and menstrual cycle: Secondary | ICD-10-CM | POA: Insufficient documentation

## 2011-01-29 DIAGNOSIS — R102 Pelvic and perineal pain: Secondary | ICD-10-CM

## 2011-01-29 DIAGNOSIS — B9689 Other specified bacterial agents as the cause of diseases classified elsewhere: Secondary | ICD-10-CM | POA: Insufficient documentation

## 2011-01-29 DIAGNOSIS — F329 Major depressive disorder, single episode, unspecified: Secondary | ICD-10-CM | POA: Insufficient documentation

## 2011-01-29 DIAGNOSIS — I10 Essential (primary) hypertension: Secondary | ICD-10-CM | POA: Insufficient documentation

## 2011-01-29 DIAGNOSIS — J449 Chronic obstructive pulmonary disease, unspecified: Secondary | ICD-10-CM | POA: Insufficient documentation

## 2011-01-29 DIAGNOSIS — IMO0002 Reserved for concepts with insufficient information to code with codable children: Secondary | ICD-10-CM | POA: Insufficient documentation

## 2011-01-29 DIAGNOSIS — K279 Peptic ulcer, site unspecified, unspecified as acute or chronic, without hemorrhage or perforation: Secondary | ICD-10-CM | POA: Insufficient documentation

## 2011-01-29 DIAGNOSIS — J4489 Other specified chronic obstructive pulmonary disease: Secondary | ICD-10-CM | POA: Insufficient documentation

## 2011-01-29 DIAGNOSIS — N760 Acute vaginitis: Secondary | ICD-10-CM | POA: Insufficient documentation

## 2011-01-29 DIAGNOSIS — F3289 Other specified depressive episodes: Secondary | ICD-10-CM | POA: Insufficient documentation

## 2011-01-29 DIAGNOSIS — A599 Trichomoniasis, unspecified: Secondary | ICD-10-CM | POA: Insufficient documentation

## 2011-01-29 DIAGNOSIS — A499 Bacterial infection, unspecified: Secondary | ICD-10-CM | POA: Insufficient documentation

## 2011-01-29 LAB — COMPREHENSIVE METABOLIC PANEL
AST: 12 U/L (ref 0–37)
BUN: 10 mg/dL (ref 6–23)
CO2: 27 mEq/L (ref 19–32)
Chloride: 100 mEq/L (ref 96–112)
Creatinine, Ser: 0.71 mg/dL (ref 0.50–1.10)
GFR calc non Af Amer: >60 mL/min (ref >60)
Glucose, Bld: 105 mg/dL — ABNORMAL HIGH (ref 70–99)
Total Bilirubin: 0.4 mg/dL (ref 0.3–1.2)

## 2011-01-29 LAB — DIFFERENTIAL
Lymphocytes Relative: 25 % (ref 12–46)
Lymphs Abs: 2 10*3/uL (ref 0.7–4.0)
Monocytes Absolute: 0.5 10*3/uL (ref 0.1–1.0)
Monocytes Relative: 6 % (ref 3–12)
Neutro Abs: 5.3 10*3/uL (ref 1.7–7.7)

## 2011-01-29 LAB — URINALYSIS, ROUTINE W REFLEX MICROSCOPIC
Hgb urine dipstick: NEGATIVE
Protein, ur: NEGATIVE mg/dL
Urobilinogen, UA: 0.2 mg/dL (ref 0.0–1.0)

## 2011-01-29 LAB — CBC
HCT: 41 % (ref 36.0–46.0)
Hemoglobin: 13.7 g/dL (ref 12.0–15.0)
WBC: 8 10*3/uL (ref 4.0–10.5)

## 2011-01-29 LAB — WET PREP, GENITAL: Yeast Wet Prep HPF POC: NONE SEEN

## 2011-01-29 MED ORDER — AZITHROMYCIN 1 G PO PACK
1.0000 g | PACK | Freq: Once | ORAL | Status: DC
  Filled 2011-01-29: qty 1

## 2011-01-29 MED ORDER — CEFTRIAXONE SODIUM 250 MG IJ SOLR
250.0000 mg | Freq: Once | INTRAMUSCULAR | Status: AC
  Administered 2011-01-29: 250 mg via INTRAMUSCULAR
  Filled 2011-01-29: qty 250

## 2011-01-29 MED ORDER — HYDROMORPHONE HCL 2 MG/ML IJ SOLN
1.0000 mg | Freq: Once | INTRAMUSCULAR | Status: AC
  Administered 2011-01-29: 1 mg via INTRAVENOUS
  Filled 2011-01-29: qty 1

## 2011-01-29 MED ORDER — ONDANSETRON HCL 4 MG/2ML IJ SOLN
4.0000 mg | Freq: Once | INTRAMUSCULAR | Status: AC
  Administered 2011-01-29: 4 mg via INTRAVENOUS
  Filled 2011-01-29: qty 2

## 2011-01-29 MED ORDER — OXYCODONE-ACETAMINOPHEN 5-325 MG PO TABS: 1.0000 | ORAL_TABLET | ORAL | Status: AC | PRN

## 2011-01-29 MED ORDER — AZITHROMYCIN 250 MG PO TABS
1000.0000 mg | ORAL_TABLET | Freq: Once | ORAL | Status: AC
  Administered 2011-01-29: 1000 mg via ORAL
  Filled 2011-01-29: qty 4

## 2011-01-29 MED ORDER — HYDROMORPHONE HCL 2 MG/ML IJ SOLN
1.0000 mg | Freq: Once | INTRAMUSCULAR | Status: AC
  Administered 2011-01-29: 12:00:00 via INTRAVENOUS
  Filled 2011-01-29: qty 1

## 2011-01-29 MED ORDER — LIDOCAINE HCL (PF) 1 % IJ SOLN
2.0000 mL | Freq: Once | INTRAMUSCULAR | Status: AC
  Administered 2011-01-29: 2 mL
  Filled 2011-01-29: qty 5

## 2011-01-29 MED ORDER — METRONIDAZOLE 500 MG PO TABS: 500.0000 mg | ORAL_TABLET | Freq: Two times a day (BID) | ORAL | Status: AC

## 2011-01-29 MED ORDER — SODIUM CHLORIDE 0.9 % IV SOLN: Freq: Once | INTRAVENOUS | Status: DC

## 2011-01-29 NOTE — ED Notes (Signed)
Ice chips given to patient. Pt still c/o worsening pain in her lower abdomen. Dr. Ignacia Palma notified and aware. IV infusing with no edema or redness.

## 2011-01-29 NOTE — ED Notes (Signed)
Pelvic exam done by Dr. Ignacia Palma. Pt states that her pain is getting worse again. Dr. Ignacia Palma aware.

## 2011-01-29 NOTE — ED Provider Notes (Signed)
History   Chart scribed for Carleene Cooper III, MD by Enos Fling; the patient was seen in room APA05/APA05; this patient's care was started at 11:41 AM.    CSN: [id number] Arrival date & time: 01/29/2011 10:48 AM  Chief Complaint  Patient presents with  . Abdominal Pain  . Vaginal Bleeding   HPI Yolanda Adams is a 45 y.o. female who presents to the Emergency Department complaining of vaginal bleeding. Pt c/o heavy vaginal bleeding persistent x 2 weeks with associated lower abd cramping, bleeding worse today with passing of clots. Also reports dizziness with standing today. LNMP July. Pt denies fever, chills, n/v/d, back pain, flank pain, or urinary complaints. Pt had recent bladder sling surgery and has had low abd pain since then.   Past Medical History  Diagnosis Date  . GERD (gastroesophageal reflux disease)   . PUD (peptic ulcer disease)   . Anxiety and depression   . DDD (degenerative disc disease)   . COPD (chronic obstructive pulmonary disease)   . HTN (hypertension)     Past Surgical History  Procedure Date  . Cholecystectomy   . Bladder surgery     x 3 in [place]  . Oophorectomy     Right  . Esophagogastroduodenoscopy     no barrett's mass,erosion, ulceration,distal esophagus was dilated 15-16 mm,nl duodenal bulb  bladder sling  Family History  Problem Relation Age of Onset  . Prostate cancer Father     Partial gastrectomy    History  Substance Use Topics  . Smoking status: Current Everyday Smoker  . Smokeless tobacco: Not on file  . Alcohol Use: No  smokes 0.5 ppd. No alcohol or drug use.  OB History    Grav Para Term Preterm Abortions TAB SAB Ect Mult Living                   Review of Systems 10 Systems reviewed and are negative for acute change except as noted in the HPI.   Allergies  Review of patient's allergies indicates no known allergies.  Home Medications   Current Outpatient Rx  Name Route Sig Dispense Refill  . ACETAMINOPHEN 650  MG PO TBCR Oral Take 650 mg by mouth every 8 (eight) hours as needed.      . ALBUTEROL SULFATE HFA 108 (90 BASE) MCG/ACT IN AERS Inhalation Inhale 2 puffs into the lungs every 6 (six) hours as needed. Shortness of breath     . IPRATROPIUM-ALBUTEROL 18-103 MCG/ACT IN AERO Inhalation Inhale 2 puffs into the lungs every 6 (six) hours as needed.      . ALPRAZOLAM 1 MG PO TABS Oral Take 1 mg by mouth at bedtime as needed.      Marland Kitchen CITALOPRAM HYDROBROMIDE 20 MG PO TABS Oral Take 20 mg by mouth at bedtime.      Marland Kitchen GABAPENTIN 300 MG PO CAPS Oral Take 300 mg by mouth daily.      Marland Kitchen HYDROCHLOROTHIAZIDE 25 MG PO TABS Oral Take 25 mg by mouth daily.      Marland Kitchen OMEPRAZOLE 20 MG PO CPDR Oral Take 20 mg by mouth daily.      . OXYBUTYNIN CHLORIDE 5 MG PO TABS Oral Take 5 mg by mouth 3 (three) times daily.      . QUETIAPINE FUMARATE 100 MG PO TABS Oral Take 100 mg by mouth at bedtime.      . TRAMADOL HCL 50 MG PO TABS Oral Take 50 mg by mouth every 6 (six) hours  as needed.      . ALBUTEROL SULFATE (2.5 MG/3ML) 0.083% IN NEBU Nebulization Take 2.5 mg by nebulization every 6 (six) hours as needed.      Marland Kitchen PHENAZOPYRIDINE HCL 100 MG PO TABS Oral Take 100 mg by mouth 3 (three) times daily as needed.      . SERTRALINE HCL 50 MG PO TABS Oral Take 50 mg by mouth daily.        Physical Exam    BP 137/82  Pulse 82  Temp(Src) 97.5 F (36.4 C) (Oral)  Resp 18  Ht 5\' 2"  (1.575 m)  Wt 160 lb (72.576 kg)  BMI 29.26 kg/m2  SpO2 99%  LMP 01/29/2011  Physical Exam  Nursing note and vitals reviewed. Constitutional: She is oriented to person, place, and time. She appears well-developed and well-nourished. No distress.  HENT:  Head: Normocephalic.  Mouth/Throat: Oropharynx is clear and moist and mucous membranes are normal.  Eyes: Conjunctivae are normal. Pupils are equal, round, and reactive to light.  Neck: Normal range of motion. Neck supple.  Cardiovascular: Normal rate, regular rhythm and intact distal pulses.  Exam  reveals no gallop and no friction rub.   No murmur heard. Pulmonary/Chest: Effort normal and breath sounds normal. She has no wheezes. She has no rales.  Abdominal: Soft. Bowel sounds are normal. She exhibits no mass. There is tenderness (suprapubic). There is no rebound and no guarding.  Genitourinary: Vaginal discharge (white in color) found.       Pelvic exam, chaperone present, blood and white discharge present in vaginal vault, diffuse tenderness  Musculoskeletal: Normal range of motion. She exhibits no edema and no tenderness.  Neurological: She is alert and oriented to person, place, and time. She exhibits normal muscle tone. Coordination normal.  Skin: Skin is warm and dry. No rash noted.  Psychiatric: She has a normal mood and affect.    ED Course  Procedures - none  OTHER DATA REVIEWED: Nursing notes and vital signs reviewed. Prior records reviewed. 3:18 PM Lab workup showed both trichomoniasis and bacterial vaginosis.  Will Rx with metronidazole 500 mg bid x 7 days, with Percocet every 4 hours if needed for pain.  Will also Rx today with Rocephin and Zirthromax to cover for potential STD.  LABS / RADIOLOGY: Results for orders placed during the hospital encounter of 01/29/11  CBC      Component Value Range   WBC 8.0  4.0 - 10.5 (K/uL)   RBC 4.35  3.87 - 5.11 (MIL/uL)   Hemoglobin 13.7  12.0 - 15.0 (g/dL)   HCT 16.1  09.6 - 04.5 (%)   MCV 94.3  78.0 - 100.0 (fL)   MCH 31.5  26.0 - 34.0 (pg)   MCHC 33.4  30.0 - 36.0 (g/dL)   RDW 40.9  81.1 - 91.4 (%)   Platelets 275  150 - 400 (K/uL)  DIFFERENTIAL      Component Value Range   Neutrophils Relative 67  43 - 77 (%)   Neutro Abs 5.3  1.7 - 7.7 (K/uL)   Lymphocytes Relative 25  12 - 46 (%)   Lymphs Abs 2.0  0.7 - 4.0 (K/uL)   Monocytes Relative 6  3 - 12 (%)   Monocytes Absolute 0.5  0.1 - 1.0 (K/uL)   Eosinophils Relative 2  0 - 5 (%)   Eosinophils Absolute 0.1  0.0 - 0.7 (K/uL)   Basophils Relative 1  0 - 1 (%)    Basophils Absolute 0.0  0.0 - 0.1 (K/uL)  COMPREHENSIVE METABOLIC PANEL      Component Value Range   Sodium 136  135 - 145 (mEq/L)   Potassium 4.1  3.5 - 5.1 (mEq/L)   Chloride 100  96 - 112 (mEq/L)   CO2 27  19 - 32 (mEq/L)   Glucose, Bld 105 (*) 70 - 99 (mg/dL)   BUN 10  6 - 23 (mg/dL)   Creatinine, Ser 4.09  0.50 - 1.10 (mg/dL)   Calcium 8.9  8.4 - 81.1 (mg/dL)   Total Protein 7.6  6.0 - 8.3 (g/dL)   Albumin 3.7  3.5 - 5.2 (g/dL)   AST 12  0 - 37 (U/L)   ALT 13  0 - 35 (U/L)   Alkaline Phosphatase 80  39 - 117 (U/L)   Total Bilirubin 0.4  0.3 - 1.2 (mg/dL)   GFR calc non Af Amer >60  >60 (mL/min)   GFR calc Af Amer >60  >60 (mL/min)  URINALYSIS, ROUTINE W REFLEX MICROSCOPIC      Component Value Range   Color, Urine STRAW (*) YELLOW    Appearance CLEAR  CLEAR    Specific Gravity, Urine <1.005 (*) 1.005 - 1.030    pH 6.5  5.0 - 8.0    Glucose, UA NEGATIVE  NEGATIVE (mg/dL)   Hgb urine dipstick NEGATIVE  NEGATIVE    Bilirubin Urine NEGATIVE  NEGATIVE    Ketones, ur NEGATIVE  NEGATIVE (mg/dL)   Protein, ur NEGATIVE  NEGATIVE (mg/dL)   Urobilinogen, UA 0.2  0.0 - 1.0 (mg/dL)   Nitrite NEGATIVE  NEGATIVE    Leukocytes, UA NEGATIVE  NEGATIVE        ED COURSE: 1:35 PM  EDP performed pelvic exam.  All results reviewed and discussed, questions answered, pt agreeable with plan.   IMPRESSION: No diagnosis found.    MEDS GIVEN IN ED:  Medications  0.9 %  sodium chloride infusion (not administered)  citalopram (CELEXA) 20 MG tablet (not administered)  albuterol (PROVENTIL HFA;VENTOLIN HFA) 108 (90 BASE) MCG/ACT inhaler (not administered)  gabapentin (NEURONTIN) 300 MG capsule (not administered)  HYDROmorphone (DILAUDID) injection 1 mg ( mg Intravenous Given 01/29/11 1155)  ondansetron (ZOFRAN) injection 4 mg (4 mg Intravenous Given 01/29/11 1156)     DISCHARGE MEDICATIONS: New Prescriptions   No medications on file     SCRIBE ATTESTATION:  I personally  performed the services described in this documentation, which was scribed in my presence. The recorded information has been reviewed and considered. Osvaldo Human, MD        Carleene Cooper III, MD 01/29/11 [phone]

## 2011-01-29 NOTE — ED Notes (Signed)
Pt c/o vaginal bleeding since September 8. Pt states that she skipped her period in August. C/o lower abdominal pain since having a bladder sling surgery. States that she has to walk bent over due to the pain. Alert and oriented x 3. Skin warm and dry. Color pink. Breath sounds clear and equal bilaterally.

## 2011-01-29 NOTE — ED Notes (Signed)
Pt reports lower abd pain, vaginal bleeding since 09/09.  Pt reports heavy bleeding, with large clots.  Pt states "it looks like liver or something".

## 2011-01-30 LAB — GC/CHLAMYDIA PROBE AMP, GENITAL: GC Probe Amp, Genital: NEGATIVE

## 2011-02-06 LAB — URINALYSIS, ROUTINE W REFLEX MICROSCOPIC
Glucose, UA: NEGATIVE
Protein, ur: NEGATIVE
Urobilinogen, UA: 0.2

## 2011-02-06 LAB — URINE MICROSCOPIC-ADD ON

## 2011-03-05 ENCOUNTER — Ambulatory Visit (INDEPENDENT_AMBULATORY_CARE_PROVIDER_SITE_OTHER): Payer: Self-pay | Admitting: Urology

## 2011-03-05 DIAGNOSIS — N3010 Interstitial cystitis (chronic) without hematuria: Secondary | ICD-10-CM

## 2011-03-05 DIAGNOSIS — R39198 Other difficulties with micturition: Secondary | ICD-10-CM

## 2011-03-05 DIAGNOSIS — N949 Unspecified condition associated with female genital organs and menstrual cycle: Secondary | ICD-10-CM

## 2011-03-15 ENCOUNTER — Telehealth: Payer: Self-pay | Admitting: Gastroenterology

## 2011-03-15 NOTE — Telephone Encounter (Signed)
Pt called wanting to make OV ASAP with SF so she can have a TCS done. I told pt that she had transferred her GI care to Dr Teena Dunk in [place] and that's where her records went.  She is not happy with Dr Teena Dunk and wants to come back to [facility]. Please advise if I need to make OV or not. Pt can be reached at [phone]

## 2011-03-18 ENCOUNTER — Telehealth: Payer: Self-pay

## 2011-03-18 NOTE — Telephone Encounter (Signed)
OK TO SCHEDULE 1ST AVAILABLE 30 MINUTE APPT.

## 2011-03-18 NOTE — Telephone Encounter (Signed)
Pt called and wanted to know if she could come in to someone before Thursday this week but she has been seeing the GI doctor in [place]. I told her I will look into this and call her back at [phone]. She has been here in the past please look at phone note from 03/15/11 also.

## 2011-03-18 NOTE — Telephone Encounter (Signed)
Susan-please schedule in first 30 minute appt.

## 2011-03-19 ENCOUNTER — Encounter: Payer: Self-pay | Admitting: Internal Medicine

## 2011-03-19 NOTE — Telephone Encounter (Signed)
Pt aware of OV with [facility]

## 2011-03-19 NOTE — Telephone Encounter (Signed)
Pt aware of OV on 11/14 @0930  with [facility]

## 2011-03-20 ENCOUNTER — Encounter: Payer: Self-pay | Admitting: Gastroenterology

## 2011-03-20 ENCOUNTER — Ambulatory Visit (INDEPENDENT_AMBULATORY_CARE_PROVIDER_SITE_OTHER): Payer: Self-pay | Admitting: Gastroenterology

## 2011-03-20 VITALS — BP 142/83 | HR 84 | Temp 98.0°F | Ht 62.0 in | Wt 171.4 lb

## 2011-03-20 DIAGNOSIS — R1310 Dysphagia, unspecified: Secondary | ICD-10-CM

## 2011-03-20 DIAGNOSIS — Z8719 Personal history of other diseases of the digestive system: Secondary | ICD-10-CM

## 2011-03-20 DIAGNOSIS — K921 Melena: Secondary | ICD-10-CM

## 2011-03-20 DIAGNOSIS — K219 Gastro-esophageal reflux disease without esophagitis: Secondary | ICD-10-CM

## 2011-03-20 DIAGNOSIS — R109 Unspecified abdominal pain: Secondary | ICD-10-CM

## 2011-03-20 DIAGNOSIS — R197 Diarrhea, unspecified: Secondary | ICD-10-CM

## 2011-03-20 DIAGNOSIS — R1314 Dysphagia, pharyngoesophageal phase: Secondary | ICD-10-CM

## 2011-03-20 NOTE — Progress Notes (Signed)
Primary Care Physician:  MUSE,ROCHELLE D., PA, PA-C  Primary Gastroenterologist:  Jonette Eva, MD   Chief Complaint  Patient presents with  . Colonoscopy    HPI:  Yolanda Adams is a 45 y.o. female here to schedule colonoscopy. Since we last saw her she transferred her care to Dr. Teena Dunk in [place], [place]. She states she wants to resume her care here because she liked our office but had to previously changed care due to transportation issues. Taking Prilosec but having bad heartburn at night. Has to sit up to sleep. Having trouble swallowing at nighttime. Chronic cough and hoarseness. Not having trouble swallowing with each meal. Epigastric pain/ruq/Lower abd pain as well for several weeks. Fecal incontinence with coughing. Bad over the last couple of months. BM pp urgency within 5-10 minutes. 5-10 stools daily. Stools are always watery to loose. Black stool. No brbpr. Abdominal pain not necessarily related to meals. Nocturnal diarrhea with urgency as well.   States she never saw all Dr. Teena Dunk for an official office visit stating that she left after he slammed door, yelled at her.   Current Outpatient Prescriptions  Medication Sig Dispense Refill  . acetaminophen (TYLENOL) 650 MG CR tablet Take 650 mg by mouth every 8 (eight) hours as needed.        Marland Kitchen albuterol (PROVENTIL HFA;VENTOLIN HFA) 108 (90 BASE) MCG/ACT inhaler Inhale 2 puffs into the lungs every 6 (six) hours as needed. Shortness of breath       . albuterol (PROVENTIL) (2.5 MG/3ML) 0.083% nebulizer solution Take 2.5 mg by nebulization every 6 (six) hours as needed.        Marland Kitchen albuterol-ipratropium (COMBIVENT) 18-103 MCG/ACT inhaler Inhale 2 puffs into the lungs every 6 (six) hours as needed.        . ALPRAZolam (XANAX) 1 MG tablet Take 1 mg by mouth at bedtime as needed.        . citalopram (CELEXA) 20 MG tablet Take 20 mg by mouth at bedtime.        . gabapentin (NEURONTIN) 300 MG capsule Take 300 mg by mouth daily.        .  hydrochlorothiazide 25 MG tablet Take 25 mg by mouth daily.        Marland Kitchen omeprazole (PRILOSEC) 20 MG capsule Take 20 mg by mouth daily.        . QUEtiapine (SEROQUEL) 100 MG tablet Take 100 mg by mouth at bedtime.        . traMADol (ULTRAM) 50 MG tablet Take 50 mg by mouth every 6 (six) hours as needed.        Marland Kitchen oxybutynin (DITROPAN) 5 MG tablet Take 5 mg by mouth 3 (three) times daily.        . phenazopyridine (PYRIDIUM) 100 MG tablet Take 100 mg by mouth 3 (three) times daily as needed.        . sertraline (ZOLOFT) 50 MG tablet Take 50 mg by mouth daily.          Allergies as of 03/20/2011  . (No Known Allergies)    Past Medical History  Diagnosis Date  . GERD (gastroesophageal reflux disease)   . PUD (peptic ulcer disease)   . Anxiety and depression   . DDD (degenerative disc disease)   . COPD (chronic obstructive pulmonary disease)   . HTN (hypertension)   . Shoulder pain   . IBS (irritable bowel syndrome)   . Peptic stricture of esophagus     dilated twice 10/2009  .  Helicobacter pylori gastritis June 2011    treated with Pylera    Past Surgical History  Procedure Date  . Cholecystectomy   . Bladder surgery     x 3 in [place]  . Oophorectomy     Right  . Esophagogastroduodenoscopy 10/26/09    Distal esophageal stricture. no barrett's mass,erosion, ulceration,distal esophagus was dilated 15-16 mm. A 17 mm dilator would not pass the upper sulcus without severe resistance. Patchy erythema in the entire body of the stomach. Biopsy showed H. pylori and she underwent treatment with Pylera,nl duodenal bulb    Family History  Problem Relation Age of Onset  . Prostate cancer Father     Partial gastrectomy  . Colon cancer Neg Hx     History   Social History  . Marital Status: Widowed    Spouse Name: N/A    Number of Children: 1  . Years of Education: N/A   Occupational History  .     Social History Main Topics  . Smoking status: Current Everyday Smoker -- 0.5 packs/day     Types: Cigarettes  . Smokeless tobacco: Not on file  . Alcohol Use: No  . Drug Use: No  . Sexually Active: Not on file   Other Topics Concern  . Not on file   Social History Narrative  . No narrative on file      ROS:  General: Negative for weight loss, fever, chills, fatigue, weakness. Positive for anorexia. Eyes: Negative for vision changes.  ENT: Negative for hoarseness,  nasal congestion. Positive for dysphagia. CV: Negative for chest pain, angina, palpitations, dyspnea on exertion, peripheral edema.  Respiratory: Negative for dyspnea at rest, dyspnea on exertion, cough, sputum, wheezing.  GI: See history of present illness. GU:  Negative for dysuria, hematuria, urinary incontinence, nocturnal urination. Positive for urinary frequency.  MS: Negative for joint pain, low back pain.  Derm: Negative for rash or itching.  Neuro: Negative for weakness, abnormal sensation, seizure, frequent headaches, memory loss, confusion.  Psych: Negative for anxiety, depression, suicidal ideation, hallucinations.  Endo: Negative for unusual weight change.  Heme: Negative for bruising or bleeding. Allergy: Negative for rash or hives.    Physical Examination:  BP 142/83  Pulse 84  Temp(Src) 98 F (36.7 C) (Temporal)  Ht 5\' 2"  (1.575 m)  Wt 171 lb 6.4 oz (77.747 kg)  BMI 31.35 kg/m2  LMP 02/25/2011   General: Well-nourished, well-developed in no acute distress.  Head: Normocephalic, atraumatic.   Eyes: Conjunctiva pink, no icterus. Mouth: Oropharyngeal mucosa moist and pink , no lesions erythema or exudate. Neck: Supple without thyromegaly, masses, or lymphadenopathy.  Lungs: Clear to auscultation bilaterally.  Heart: Regular rate and rhythm, no murmurs rubs or gallops.  Abdomen: Bowel sounds are normal, tender in the right abdomen/right upper quadrant., nondistended, no hepatosplenomegaly or masses, no abdominal bruits or    hernia , no rebound or guarding.   Rectal: Not  performed. Extremities: No lower extremity edema. No clubbing or deformities.  Neuro: Alert and oriented x 4 , grossly normal neurologically.  Skin: Warm and dry, no rash or jaundice.   Psych: Alert and cooperative, normal mood and affect.

## 2011-03-21 LAB — CBC WITH DIFFERENTIAL/PLATELET
Basophils Absolute: 0 10*3/uL (ref 0.0–0.1)
Basophils Relative: 1 % (ref 0–1)
Eosinophils Relative: 2 % (ref 0–5)
HCT: 36.4 % (ref 36.0–46.0)
MCH: 30.3 pg (ref 26.0–34.0)
MCHC: 31.6 g/dL (ref 30.0–36.0)
MCV: 95.8 fL (ref 78.0–100.0)
Monocytes Absolute: 0.4 10*3/uL (ref 0.1–1.0)
RDW: 13.6 % (ref 11.5–15.5)

## 2011-03-21 LAB — COMPREHENSIVE METABOLIC PANEL
AST: 13 U/L (ref 0–37)
Alkaline Phosphatase: 62 U/L (ref 39–117)
BUN: 8 mg/dL (ref 6–23)
Calcium: 9.4 mg/dL (ref 8.4–10.5)
Creat: 0.88 mg/dL (ref 0.50–1.10)

## 2011-03-21 NOTE — Progress Notes (Signed)
Quick Note:  LMOM to call. ______ 

## 2011-03-21 NOTE — Progress Notes (Signed)
Quick Note:  Labs ok. Await CT and procedures. ______

## 2011-03-22 ENCOUNTER — Ambulatory Visit (HOSPITAL_COMMUNITY)
Admission: RE | Admit: 2011-03-22 | Discharge: 2011-03-22 | Disposition: A | Discharge status: Home / Self Care | Payer: Self-pay | Source: Ambulatory Visit | Attending: Gastroenterology | Admitting: Gastroenterology

## 2011-03-22 ENCOUNTER — Encounter: Payer: Self-pay | Admitting: Gastroenterology

## 2011-03-22 DIAGNOSIS — R1031 Right lower quadrant pain: Secondary | ICD-10-CM | POA: Insufficient documentation

## 2011-03-22 DIAGNOSIS — K921 Melena: Secondary | ICD-10-CM

## 2011-03-22 DIAGNOSIS — R197 Diarrhea, unspecified: Secondary | ICD-10-CM | POA: Insufficient documentation

## 2011-03-22 DIAGNOSIS — N83209 Unspecified ovarian cyst, unspecified side: Secondary | ICD-10-CM | POA: Insufficient documentation

## 2011-03-22 DIAGNOSIS — R109 Unspecified abdominal pain: Secondary | ICD-10-CM

## 2011-03-22 MED ORDER — IOHEXOL 300 MG/ML  SOLN
100.0000 mL | Freq: Once | INTRAMUSCULAR | Status: AC | PRN
  Administered 2011-03-22: 100 mL via INTRAVENOUS

## 2011-03-22 NOTE — Assessment & Plan Note (Signed)
Check CBC. EGD as planned.

## 2011-03-22 NOTE — Assessment & Plan Note (Signed)
Recurrent esophageal dysphagia in the setting of previous esophageal strictures. EGD with possible dilation in the near future. Procedure to be performed in the OR with deep sedation due to polypharmacy.  I have discussed the risks, alternatives, benefits with regards to but not limited to the risk of reaction to medication, bleeding, infection, perforation and the patient is agreeable to proceed. Written consent to be obtained.

## 2011-03-22 NOTE — Assessment & Plan Note (Addendum)
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 Patient ready to have a colonoscopy to exclude inflammatory bowel disease, microscopic colitis, less likely colon cancer. Plan for colonoscopy with deep sedation in the OR due to polypharmacy.  Will check TTG. Consider small bowel biopsy at time of EGD.

## 2011-03-22 NOTE — Assessment & Plan Note (Addendum)
Right-sided abdominal pain pretty significant on exam. Prior to performing colonoscopy, evaluate with CT scan. Also obtain labs including CBC, CMET, lipase. She is status post cholecystectomy. Reevaluate upper GI tract at time of the EGD.

## 2011-03-25 ENCOUNTER — Telehealth: Payer: Self-pay | Admitting: Gastroenterology

## 2011-03-25 ENCOUNTER — Telehealth: Payer: Self-pay

## 2011-03-25 NOTE — Telephone Encounter (Signed)
Called pt and informed her of her Pre Op appt for 04/11/11 @ 12:45- pt voiced her understanding

## 2011-03-25 NOTE — Progress Notes (Signed)
Lab added to previous CBC per Jamesetta So at [place]. Pt. Aware.

## 2011-03-25 NOTE — Telephone Encounter (Signed)
Pt informed she does not have to have more blood drawn. Spoke to [employer] @ Clinical biochemist at [employer]. She said the Tissue transglutaminase, IGA can be added.

## 2011-03-25 NOTE — Telephone Encounter (Signed)
LMOM for pt to call. She needs to get a CBC drawn. Lab order has been faxed to [facility].

## 2011-03-25 NOTE — Progress Notes (Signed)
Cc to PCP 

## 2011-03-26 ENCOUNTER — Telehealth: Payer: Self-pay

## 2011-03-26 LAB — TISSUE TRANSGLUTAMINASE, IGA: Tissue Transglutaminase Ab, IgA: 8.1 U/mL (ref <20)

## 2011-03-26 NOTE — Progress Notes (Signed)
HB 11.5 NL HFP & TTG IgA. CT NAIAP  REVIEWED. AGREE.

## 2011-03-26 NOTE — Telephone Encounter (Signed)
REVIEWED.  

## 2011-03-26 NOTE — Telephone Encounter (Signed)
Pt would like results of her CT.

## 2011-03-26 NOTE — Progress Notes (Signed)
168 LBS JUN 2012 163 LBS FEB 2012

## 2011-03-27 ENCOUNTER — Other Ambulatory Visit: Payer: Self-pay

## 2011-03-27 ENCOUNTER — Telehealth: Payer: Self-pay

## 2011-03-27 DIAGNOSIS — D649 Anemia, unspecified: Secondary | ICD-10-CM

## 2011-03-27 MED ORDER — HYDROCODONE-ACETAMINOPHEN 5-500 MG PO TABS: 1.0000 | ORAL_TABLET | ORAL | Status: DC | PRN

## 2011-03-27 NOTE — Telephone Encounter (Signed)
Pt called back and wanted to know what to do about her kidney stone and her pain, she requested something for pain. I spoke with Tana Coast, PA who said the stone is in the kidney, not the tubes, so it is not causing the problem. Can ask pt if she has Ultram, if she does, she can take some, and procede with procedures to try to find out what is causing her problem. LMOM for pt to return call.

## 2011-03-27 NOTE — Telephone Encounter (Signed)
Pt was informed. Said she does not have any Ultram. OK to send Rx to [facility] in [place].

## 2011-03-27 NOTE — Progress Notes (Signed)
Quick Note:  Pt was informed. ______ 

## 2011-03-27 NOTE — Progress Notes (Signed)
Quick Note:  Hgb 11.4 but HCT normal. Celiac screen negative. Procedures as planned. Consider rechecking CBC in three months if patient agrees. ______

## 2011-03-27 NOTE — Progress Notes (Signed)
Quick Note:  LMOM to call. ______ 

## 2011-03-27 NOTE — Progress Notes (Signed)
Quick Note:  Please let the patient know there is nothing on CT to explain her GI symptoms. She has scarring involving both kidneys and a right kidney stone (asymptomatic).Celiac screen negative. Recommend procedures as planned. ______

## 2011-03-27 NOTE — Progress Notes (Signed)
LMOM to call.

## 2011-03-27 NOTE — Telephone Encounter (Signed)
Pt aware.

## 2011-03-27 NOTE — Progress Notes (Signed)
Quick Note:  Pt informed. CBC on file for 06/26/2011. ______

## 2011-03-27 NOTE — Telephone Encounter (Signed)
Please call in or fax in vicodin #20, see hand out.

## 2011-03-27 NOTE — Telephone Encounter (Signed)
Called the Rx to Diane at [facility]. (Vicodin 5/500  #20, one every 4 hours as needed for pain. No refills. )  Pt was told to check with them later today.

## 2011-03-27 NOTE — Telephone Encounter (Signed)
See result notes. 

## 2011-03-27 NOTE — Telephone Encounter (Signed)
LMOM for pt to call. 

## 2011-04-04 ENCOUNTER — Ambulatory Visit (INDEPENDENT_AMBULATORY_CARE_PROVIDER_SITE_OTHER): Payer: Self-pay | Admitting: Gastroenterology

## 2011-04-04 ENCOUNTER — Encounter: Payer: Self-pay | Admitting: Gastroenterology

## 2011-04-04 DIAGNOSIS — R109 Unspecified abdominal pain: Secondary | ICD-10-CM

## 2011-04-04 DIAGNOSIS — K219 Gastro-esophageal reflux disease without esophagitis: Secondary | ICD-10-CM

## 2011-04-04 DIAGNOSIS — Z8719 Personal history of other diseases of the digestive system: Secondary | ICD-10-CM

## 2011-04-04 DIAGNOSIS — R1314 Dysphagia, pharyngoesophageal phase: Secondary | ICD-10-CM

## 2011-04-04 DIAGNOSIS — R1310 Dysphagia, unspecified: Secondary | ICD-10-CM

## 2011-04-04 DIAGNOSIS — R197 Diarrhea, unspecified: Secondary | ICD-10-CM

## 2011-04-04 MED ORDER — PROMETHAZINE HCL 12.5 MG PO TABS: 12.5000 mg | ORAL_TABLET | Freq: Four times a day (QID) | ORAL | Status: AC | PRN

## 2011-04-04 MED ORDER — DICYCLOMINE HCL 10 MG PO CAPS: ORAL_CAPSULE | ORAL | Status: DC

## 2011-04-04 NOTE — Assessment & Plan Note (Addendum)
MOST LIKELY MUSCULO-SKELETAL. PAIN UNAFFECTED BY EATING. CT A/P 11/14-NAIAP. PT W/ HX:H PYLORI  HEAT TID TO RIGHT SIDE. DECLINED TO GIVE PT NARCOTIC PAIN MEDS. SHE DECLINED A Rx FOR ULTRAM. EGD-GASTRIC Bx to confirm eradication of h. Pylori.

## 2011-04-04 NOTE — Assessment & Plan Note (Addendum)
MOST LIKELY 2o TO IBS-d. DIFFERENTIAL DIAGNOSIS INCLUDES C DIFF, MICROSCOPIC COLITIS, GIARDIASIS, CELIAC SPRUE.  STOOL STUDIES. RESTART BENTYL. PEPTO PRN. PHENERGAN PRN. TCS/EGD W/ Bx COLON AND SMALL BOWEL OPV in 4 mos.

## 2011-04-04 NOTE — Patient Instructions (Addendum)
Submit stool studies. APPLY HEAT TO YOUR RIGHT SIDE 3 TIMES A DAY. Take Bentyl 1-2 30 minutes prior to meals and at bedtime. YOU MAY USE PEPTO BISMOL AS NEEDED. IT CAUSES BLACK STOOLS. USE PROMETHAZINE AS NEEDED FOR NAUSEA.  BOTH PRESCRIPTION WERE SENT TO [facility] IN [place], Follow up in 4 mos.  Irritable Bowel Syndrome Irritable Bowel Syndrome (IBS) is caused by a disturbance of normal bowel function. Other terms used are spastic colon, mucous colitis, and irritable colon. It does not require surgery, nor does it lead to cancer. There is no cure for IBS. But with proper diet, stress reduction, and medication, you will find that your problems (symptoms) will gradually disappear or improve. IBS is a common digestive disorder. It usually appears in late adolescence or early adulthood. Women develop it twice as often as men.  CAUSES  After food has been digested and absorbed in the small intestine, waste material is moved into the colon (large intestine). In the colon, water and salts are absorbed from the undigested products coming from the small intestine. The remaining residue, or fecal material, is held for elimination. Under normal circumstances, gentle, rhythmic contractions on the bowel walls push the fecal material along the colon towards the rectum. In IBS, however, these contractions are irregular and poorly coordinated. The fecal material is either retained too long, resulting in constipation, or expelled too soon, producing diarrhea.  SYMPTOMS  The most common symptom of IBS is pain. It is typically in the lower left side of the belly (abdomen). But it may occur anywhere in the abdomen. It can be felt as heartburn, backache, or even as a dull pain in the arms or shoulders. The pain comes from excessive bowel-muscle spasms and from the buildup of gas and fecal material in the colon. This pain:  Can range from sharp belly (abdominal) cramps to a dull, continuous ache.   Usually worsens soon  after eating.   Is typically relieved by having a bowel movement or passing gas.  Abdominal pain is usually accompanied by constipation. But it may also produce diarrhea. The diarrhea typically occurs right after a meal or upon arising in the morning. The stools are typically soft and watery. They are often flecked with secretions (mucus). Other symptoms of IBS include:  Bloating.   Loss of appetite.   Heartburn.   Feeling sick to your stomach (nausea).   Belching   Vomiting   Gas.  IBS may also cause a number of symptoms that are unrelated to the digestive system:  Fatigue.   Headaches.   Anxiety   Shortness of breath   Difficulty in concentrating.   Dizziness.  These symptoms tend to come and go.  DIAGNOSIS  The symptoms of IBS closely mimic the symptoms of other, more serious digestive disorders. So your caregiver may wish to perform a variety of additional tests to exclude these disorders. He/she wants to be certain of learning what is wrong (diagnosis). The nature and purpose of each test will be explained to you.  TREATMENT A number of medications are available to help correct bowel function and/or relieve bowel spasms and abdominal pain. Among the drugs available are:  Mild, non-irritating laxatives for severe constipation and to help restore normal bowel habits.   Specific anti-diarrheal medications to treat severe or prolonged diarrhea.   Anti-spasmodic agents to relieve intestinal cramps.   Your caregiver may also decide to treat you with a mild tranquilizer or sedative during unusually stressful periods in your life.  The important thing to remember is that if any drug is prescribed for you, make sure that you take it exactly as directed. Make sure that your caregiver knows how well it worked for you.  HOME CARE INSTRUCTIONS   Avoid foods that are high in fat or oils. Some examples ZOX:WRUEA cream, butter, frankfurters, sausage, and other fatty meats.    Avoid foods that have a laxative effect, such as fruit, fruit juice, and dairy products.   Cut out carbonated drinks, chewing gum, and "gassy" foods, such as beans and cabbage. This may help relieve bloating and belching.   Bran taken with plenty of liquids may help relieve constipation.   Keep track of what foods seem to trigger your symptoms.   Avoid emotionally charged situations or circumstances that produce anxiety.   Start or continue exercising.   Get plenty of rest and sleep.

## 2011-04-04 NOTE — Assessment & Plan Note (Signed)
EGD/DIL W/ PROPOFOL DUE TO POLYPHARMACY. LAST DIL THE 17 MM DILATOR WOULD NOT PASS.  OPV IN 4 MOS IF SX PERSIST PT NEEDS MBS/BPE.

## 2011-04-04 NOTE — Assessment & Plan Note (Signed)
SX FAIRLY WELL CONTROLLED.  CONTINUE PRILOSEC.

## 2011-04-04 NOTE — Progress Notes (Signed)
Cc to PCP 

## 2011-04-04 NOTE — Progress Notes (Signed)
Subjective:    Patient ID: Yolanda Adams, female    DOB: 1965-12-05, 45 y.o.   MRN: [id number]  PCP: [facility]  HPI Pain in right side & swelling. Bms: if eats then 10 mins watery stool. Pain in right side been there a couple of weeks. No radiation. Better: nothing except a little 5/500. Worse: not affected by eating or moving. Feels like she's going to throw up but she does not: off and on every day.Can see black stool. Keeps a thing of Pepto Bismol all the time. Hasn't had Bentyl in a while. Reports feeling warm and then freezes. Didn't check temperature. No blood in stool. Sees MH every 6 mos. a[facility] SEES PSYCHOLOGIST Q[facility].  No weight loss. Appetite: "doesn't have one this week", but usu. Good.  Swallowing: 3 times a week-solids. No neck surgery. Drinks decaf sweet tea. Using BCs this week-AM X1. IBUPROFEN: 1-2X/DAY. ETOH:NO  Past Medical History  Diagnosis Date  . GERD (gastroesophageal reflux disease)   . PUD (peptic ulcer disease)   . Anxiety and depression   . DDD (degenerative disc disease)   . COPD (chronic obstructive pulmonary disease)   . HTN (hypertension)   . Shoulder pain   . IBS (irritable bowel syndrome)   . Peptic stricture of esophagus     dilated twice 10/2009  . Helicobacter pylori gastritis June 2011    treated with Pylera    Past Surgical History  Procedure Date  . Cholecystectomy   . Bladder surgery     x 3 in [place]  . Oophorectomy     Right  . Esophagogastroduodenoscopy 10/26/09 GASTRITIS/DUODENITIS    Distal esol stricture/dil 15-16 mm. A 17 mm dilator would not pass  . Upper gastrointestinal endoscopy JUN 14, 2011DYSPHAGIA, EPIG PAIN    H PYLORI GASTRITIS    No Known Allergies  Current Outpatient Prescriptions  Medication Sig Dispense Refill  . acetaminophen (TYLENOL) 650 MG CR tablet Take 650 mg by mouth every 8 (eight) hours as needed.        Marland Kitchen albuterol (PROVENTIL HFA;VENTOLIN HFA) 108 (90 BASE) MCG/ACT inhaler Inhale 2 puffs into  the lungs every 6 (six) hours as needed. Shortness of breath       . albuterol (PROVENTIL) (2.5 MG/3ML) 0.083% nebulizer solution Take 2.5 mg by nebulization every 6 (six) hours as needed.        Marland Kitchen albuterol-ipratropium (COMBIVENT) 18-103 MCG/ACT inhaler Inhale 2 puffs into the lungs every 6 (six) hours as needed.        . ALPRAZolam (XANAX) 1 MG tablet Take 1 mg by mouth at bedtime as needed.        . citalopram (CELEXA) 20 MG tablet Take 20 mg by mouth at bedtime.        . gabapentin (NEURONTIN) 300 MG capsule Take 300 mg by mouth daily.        . hydrochlorothiazide 25 MG tablet Take 25 mg by mouth daily.        Marland Kitchen HYDROcodone-acetaminophen (VICODIN) 5-500 MG per tablet Take 1 tablet by mouth every 4 (four) hours as needed for pain.    Marland Kitchen omeprazole (PRILOSEC) 20 MG capsule Take 20 mg by mouth daily.      . QUEtiapine (SEROQUEL) 100 MG tablet Take 100 mg by mouth at bedtime.      . traMADol (ULTRAM) 50 MG tablet Take 50 mg by mouth every 6 (six) hours as needed.      Marland Kitchen      Marland Kitchen  Review of Systems HAD URINARY INCONTINENCE AND "BLADDER SLING" PUT IN.  PER HPI OTHERWISE ALL SYSTEMS NEGATIVE.    Objective:   Physical Exam  Constitutional: She is oriented to person, place, and time. She appears well-developed and well-nourished. No distress.  HENT:  Head: Normocephalic and atraumatic.  Mouth/Throat: Oropharynx is clear and moist. No oropharyngeal exudate.  Eyes: Pupils are equal, round, and reactive to light. No scleral icterus.  Neck: Normal range of motion. Neck supple.  Cardiovascular: Normal rate, regular rhythm and normal heart sounds.   Pulmonary/Chest: Effort normal and breath sounds normal. No respiratory distress.  Abdominal: Soft. She exhibits no distension. There is Tenderness: MOD TTP IN RUQ, MILD TTP IN LLQ.Marland Kitchen There is no rebound and no guarding.       POS CARNETT'S SIGN RUQ WITH HEAD AND ARMS OFF BED   Musculoskeletal: She exhibits no edema.  Lymphadenopathy:     She has no cervical adenopathy.  Neurological: She is alert and oriented to person, place, and time.       NO FOCAL DEFICITS   Psychiatric:       ANXIOUS MOOD          Assessment & Plan:

## 2011-04-08 NOTE — Progress Notes (Signed)
Per Yolanda Adams, pt has 100% [facility] from 11/29/2010 - 06/01/2011. Form faxed to [employer] for pt's labs today.

## 2011-04-08 NOTE — Progress Notes (Signed)
Reminder in epic to follow up in 4 months °

## 2011-04-09 LAB — FECAL LACTOFERRIN, QUANT: Lactoferrin: NEGATIVE

## 2011-04-09 LAB — GIARDIA ANTIGEN: Giardia Screen (EIA): NEGATIVE

## 2011-04-09 NOTE — Progress Notes (Signed)
Remind pt to drop off stool studies.

## 2011-04-10 NOTE — Progress Notes (Signed)
Quick Note:  LMOM stool studies negative. ______

## 2011-04-11 ENCOUNTER — Other Ambulatory Visit: Payer: Self-pay

## 2011-04-11 ENCOUNTER — Encounter (HOSPITAL_COMMUNITY)
Admission: RE | Admit: 2011-04-11 | Discharge: 2011-04-11 | Disposition: A | Discharge status: Home / Self Care | Payer: Self-pay | Source: Ambulatory Visit | Attending: Gastroenterology | Admitting: Gastroenterology

## 2011-04-11 ENCOUNTER — Encounter (HOSPITAL_COMMUNITY): Payer: Self-pay | Admitting: Pharmacy Technician

## 2011-04-11 ENCOUNTER — Encounter (HOSPITAL_COMMUNITY): Payer: Self-pay

## 2011-04-11 HISTORY — DX: Unspecified convulsions: R56.9

## 2011-04-11 HISTORY — DX: Shortness of breath: R06.02

## 2011-04-11 HISTORY — DX: Chronic kidney disease, unspecified: N18.9

## 2011-04-11 LAB — PREGNANCY, URINE: Preg Test, Ur: NEGATIVE

## 2011-04-11 NOTE — Patient Instructions (Signed)
20 Yolanda Adams  04/11/2011   Your procedure is scheduled on:  04/15/2011  Report to [facility] at 06:15 AM.  Call this number if you have problems the morning of surgery: [phone]   Remember:   Do not eat food:After Midnight.  May have clear liquids:until Midnight .  Clear liquids include soda, tea, black coffee, apple or grape juice, broth.  Take these medicines the morning of surgery with A SIP OF WATER: Vicodin, Phenergan, Xanax, Citalopram, Gabapentin, Hydrochlorothiazide, Omeprazole, Tramdol, and Bentyl. Also, take your inhaler, Albuterol.   Do not wear jewelry, make-up or nail polish.  Do not wear lotions, powders, or perfumes. You may wear deodorant.  Do not shave 48 hours prior to surgery.  Do not bring valuables to the hospital.  Contacts, dentures or bridgework may not be worn into surgery.  Leave suitcase in the car. After surgery it may be brought to your room.  For patients admitted to the hospital, checkout time is 11:00 AM the day of discharge.   Patients discharged the day of surgery will not be allowed to drive home.  Name and phone number of your driver:   Special Instructions: N/A   Please read over the following fact sheets that you were given: Anesthesia Post-op Instructions     Esophageal Dilatation The esophagus is the long, narrow tube which carries food and liquid from the mouth to the stomach. Esophageal dilatation is the technique used to stretch a blocked or narrowed portion of the esophagus. This procedure is used when a part of the esophagus has become so narrow that it becomes difficult, painful or even impossible to swallow. This is generally an uncomplicated form of treatment. When this is not successful, chest surgery may be required. This is a much more extensive form of treatment with a longer recovery time. CAUSES  Some of the more common causes of blockage or strictures of the esophagus are:  Narrowing from longstanding inflammation  (soreness and redness) of the lower esophagus. This comes from the constant exposure of the lower esophagus to the acid which bubbles up from the stomach. Over time this causes scarring and narrowing of the lower esophagus.   Hiatal hernia in which a small part of the stomach bulges (herniates) up through the diaphragm. This can cause a gradual narrowing of the end of the esophagus.   Schatzki's Ring is a narrow ring of benign (non-cancerous) fibrous tissue which constricts the lower esophagus. The reason for this is not known.   Scleroderma is a connective tissue disorder that affects the esophagus and makes swallowing difficult.   Achalasia is an absence of nerves to the lower esophagus and to the esophageal sphincter. This is the circular muscle between the stomach and esophagus that relaxes to allow food into the stomach. After swallowing, it contracts to keep food in the stomach. This absence of nerves may be congenital (present since birth). This can cause irregular spasms of the lower esophageal muscle. This spasm does not open up to allow food and fluid through. The result is a persistent blockage with subsequent slow trickling of the esophageal contents into the stomach.   Strictures may develop from swallowing materials which damage the esophagus. Some examples are strong acids or alkalis such as lye.   Growths such as benign (non-cancerous) and malignant (cancerous) tumors can block the esophagus.   Heredity (present since birth) causes.  DIAGNOSIS  Your caregiver often suspects this problem by taking a medical history. They will also  do a physical exam. They can then prove their suspicions using X-rays and endoscopy. Endoscopy is an exam in which a tube like a small flexible telescope is used to look at your esophagus.  TREATMENT There are different stretching (dilating) techniques which can be used. Simple bougie dilatation may be done in the office. This usually takes only a couple  minutes. A numbing (anesthetic) spray of the throat is used. Endoscopy, when done, is done in an endoscopy suite, under mild sedation. When fluoroscopy is used, the procedure is performed in X-ray. Other techniques require a little longer time. Recovery is usually quick. There is no waiting time to begin eating and drinking to test success of the treatment. Following are some of the methods used. Narrowing of the esophagus is treated by making it bigger. Commonly this is a mechanical problem which can be treated with stretching. This can be done in different ways. Your caregiver will discuss these with you. Some of the means used are:  A series of graduated (increasing thickness) flexible dilators can be used. These are weighted tubes passed through the esophagus into the stomach. The tubes used become progressively larger until the desired stretched size is reached. Graduated dilators are a simple and quick way of opening the esophagus. No visualization is required.   Another method is the use of endoscopy to place a flexible wire across the stricture. The endoscope is removed and the wire left in place. A dilator with a hole through it from end to end is guided down the esophagus and across the stricture. One or more of these dilators are passed over the wire. At the end of the exam, the wire is removed. This type of treatment may be performed in the X-ray department under fluoroscopy. An advantage of this procedure is the examiner is visualizing the end opening in the esophagus.   Stretching of the esophagus may be done using balloons. Deflated balloons are placed through the endoscope and across the stricture. This type of balloon dilatation is often done at the time of endoscopy or fluoroscopy. Flexible endoscopy allows the examiner to directly view the stricture. A balloon is inserted in the deflated form into the area of narrowing. It is then inflated with air to a certain pressure that is pre-set for  a given circumference. When inflated, it becomes sausage shaped, stretched, and makes the stricture larger.   Achalasia requires a longer larger balloon-type dilator. This is frequently done under X-ray control. In this situation, the spastic muscle fibers in the lower esophagus are stretched.  All of the above procedures make the passage of food and water into the stomach easier. They also make it easier for stomach contents to reflux back into the esophagus. Special medications may be used following the procedure to help prevent further stricturing. Proton-pump inhibitor medications are good at decreasing the amount of acid in the stomach juice. When stomach juice refluxes into the esophagus, the juice is no longer as acidic and is less likely to burn or scar the esophagus. RISKS AND COMPLICATIONS Esophageal dilatation is usually performed effectively and without problems. Some complications that can occur are:  A small amount of bleeding almost always happens where the stretching takes place. If this is too excessive it may require more aggressive treatment.   An uncommon complication is perforation (making a hole) of the esophagus. The esophagus is thin. It is easy to make a hole in it. If this happens, an operation may be necessary to repair  this.   A small, undetected perforation could lead to an infection in the chest. This can be very serious.  HOME CARE INSTRUCTIONS   If you received sedation for your procedure, do not drive, make important decisions, or perform any activities requiring your full coordination. Do not drink alcohol, take sedatives, or use any mind altering chemicals unless instructed by your caregiver.   You may use throat lozenges or warm salt water gargles if you have throat discomfort   You can begin eating and drinking normally on return home unless instructed otherwise. Do not purposely try to force large chunks of food down to test the benefits of your procedure.    Mild discomfort can be eased with sips of ice water.   Medications for discomfort may or may not be needed.  SEEK IMMEDIATE MEDICAL CARE IF:   You begin vomiting up blood.   You develop black tarry stools   You develop chills or an unexplained temperature of over 101 F (38.3 C)   You develop chest or abdominal pain.   You develop shortness of breath or feel lightheaded or faint.   Your swallowing is becoming more painful, difficult, or you are unable to swallow.  MAKE SURE YOU:   Understand these instructions.   Will watch your condition.   Will get help right away if you are not doing well or get worse.  Document Released: 06/13/2005 Document Revised: 01/02/2011 Document Reviewed: 07/31/2005 [facility] Patient Information 2012 [place], [place].    Esophagogastroduodenoscopy This is an endoscopic procedure (a procedure that uses a device like a flexible telescope) that allows your caregiver to view the upper stomach and small bowel. This test allows your caregiver to look at the esophagus. The esophagus carries food from your mouth to your stomach. They can also look at your duodenum. This is the first part of the small intestine that attaches to the stomach. This test is used to detect problems in the bowel such as ulcers and inflammation. PREPARATION FOR TEST Nothing to eat after midnight the day before the test. NORMAL FINDINGS Normal esophagus, stomach, and duodenum. Ranges for normal findings may vary among different laboratories and hospitals. You should always check with your doctor after having lab work or other tests done to discuss the meaning of your test results and whether your values are considered within normal limits. MEANING OF TEST  Your caregiver will go over the test results with you and discuss the importance and meaning of your results, as well as treatment options and the need for additional tests if necessary. OBTAINING THE TEST RESULTS It is your  responsibility to obtain your test results. Ask the lab or department performing the test when and how you will get your results. Document Released: 08/23/2004 Document Revised: 01/02/2011 Document Reviewed: 04/01/2008 [facility] Patient Information 2012 [place], [place].    Colonoscopy A colonoscopy is an exam to evaluate your entire colon. In this exam, your colon is cleansed. A long fiberoptic tube is inserted through your rectum and into your colon. The fiberoptic scope (endoscope) is a long bundle of enclosed and very flexible fibers. These fibers transmit light to the area examined and send images from that area to your caregiver. Discomfort is usually minimal. You may be given a drug to help you sleep (sedative) during or prior to the procedure. This exam helps to detect lumps (tumors), polyps, inflammation, and areas of bleeding. Your caregiver may also take a small piece of tissue (biopsy) that will be examined  under a microscope. LET YOUR CAREGIVER KNOW ABOUT:   Allergies to food or medicine.   Medicines taken, including vitamins, herbs, eyedrops, over-the-counter medicines, and creams.   Use of steroids (by mouth or creams).   Previous problems with anesthetics or numbing medicines.   History of bleeding problems or blood clots.   Previous surgery.   Other health problems, including diabetes and kidney problems.   Possibility of pregnancy, if this applies.  BEFORE THE PROCEDURE   A clear liquid diet may be required for 2 days before the exam.   Ask your caregiver about changing or stopping your regular medications.   Liquid injections (enemas) or laxatives may be required.   A large amount of electrolyte solution may be given to you to drink over a short period of time. This solution is used to clean out your colon.   You should be present 60 minutes prior to your procedure or as directed by your caregiver.  AFTER THE PROCEDURE   If you received a sedative or pain  relieving medication, you will need to arrange for someone to drive you home.   Occasionally, there is a little blood passed with the first bowel movement. Do not be concerned.  FINDING OUT THE RESULTS OF YOUR TEST Not all test results are available during your visit. If your test results are not back during the visit, make an appointment with your caregiver to find out the results. Do not assume everything is normal if you have not heard from your caregiver or the medical facility. It is important for you to follow up on all of your test results. HOME CARE INSTRUCTIONS   It is not unusual to pass moderate amounts of gas and experience mild abdominal cramping following the procedure. This is due to air being used to inflate your colon during the exam. Walking or a warm pack on your belly (abdomen) may help.   You may resume all normal meals and activities after sedatives and medicines have worn off.   Only take over-the-counter or prescription medicines for pain, discomfort, or fever as directed by your caregiver. Do not use aspirin or blood thinners if a biopsy was taken. Consult your caregiver for medicine usage if biopsies were taken.  SEEK IMMEDIATE MEDICAL CARE IF:   You have a fever.   You pass large blood clots or fill a toilet with blood following the procedure. This may also occur 10 to 14 days following the procedure. This is more likely if a biopsy was taken.   You develop abdominal pain that keeps getting worse and cannot be relieved with medicine.  Document Released: 04/19/2000 Document Revised: 01/02/2011 Document Reviewed: 12/03/2007 [facility] Patient Information 2012 [place], [place].   PATIENT INSTRUCTIONS POST-ANESTHESIA  IMMEDIATELY FOLLOWING SURGERY:  Do not drive or operate machinery for the first twenty four hours after surgery.  Do not make any important decisions for twenty four hours after surgery or while taking narcotic pain medications or sedatives.  If you develop  intractable nausea and vomiting or a severe headache please notify your doctor immediately.  FOLLOW-UP:  Please make an appointment with your surgeon as instructed. You do not need to follow up with anesthesia unless specifically instructed to do so.  WOUND CARE INSTRUCTIONS (if applicable):  Keep a dry clean dressing on the anesthesia/puncture wound site if there is drainage.  Once the wound has quit draining you may leave it open to air.  Generally you should leave the bandage intact for twenty  four hours unless there is drainage.  If the epidural site drains for more than 36-48 hours please call the anesthesia department.  QUESTIONS?:  Please feel free to call your physician or the hospital operator if you have any questions, and they will be happy to assist you.     [facility] Anesthesia Department [address] [place] [phone]

## 2011-04-12 LAB — CLOSTRIDIUM DIFFICILE BY PCR: Toxigenic C. Difficile by PCR: NOT DETECTED

## 2011-04-15 ENCOUNTER — Ambulatory Visit (HOSPITAL_COMMUNITY): Payer: Self-pay | Admitting: Anesthesiology

## 2011-04-15 ENCOUNTER — Other Ambulatory Visit: Payer: Self-pay | Admitting: Gastroenterology

## 2011-04-15 ENCOUNTER — Encounter (HOSPITAL_COMMUNITY): Payer: Self-pay | Admitting: Anesthesiology

## 2011-04-15 ENCOUNTER — Ambulatory Visit (HOSPITAL_COMMUNITY)
Admission: RE | Admit: 2011-04-15 | Discharge: 2011-04-15 | Disposition: A | Discharge status: Home / Self Care | Payer: Self-pay | Source: Ambulatory Visit | Attending: Gastroenterology | Admitting: Gastroenterology

## 2011-04-15 ENCOUNTER — Encounter (HOSPITAL_COMMUNITY): Payer: Self-pay | Admitting: *Deleted

## 2011-04-15 ENCOUNTER — Encounter (HOSPITAL_COMMUNITY)
Admission: RE | Disposition: A | Discharge status: Home / Self Care | Payer: Self-pay | Source: Ambulatory Visit | Attending: Gastroenterology

## 2011-04-15 DIAGNOSIS — J4489 Other specified chronic obstructive pulmonary disease: Secondary | ICD-10-CM | POA: Insufficient documentation

## 2011-04-15 DIAGNOSIS — R1310 Dysphagia, unspecified: Secondary | ICD-10-CM

## 2011-04-15 DIAGNOSIS — K2940 Chronic atrophic gastritis without bleeding: Secondary | ICD-10-CM | POA: Insufficient documentation

## 2011-04-15 DIAGNOSIS — R197 Diarrhea, unspecified: Secondary | ICD-10-CM | POA: Insufficient documentation

## 2011-04-15 DIAGNOSIS — K2990 Gastroduodenitis, unspecified, without bleeding: Secondary | ICD-10-CM

## 2011-04-15 DIAGNOSIS — Z79899 Other long term (current) drug therapy: Secondary | ICD-10-CM | POA: Insufficient documentation

## 2011-04-15 DIAGNOSIS — J449 Chronic obstructive pulmonary disease, unspecified: Secondary | ICD-10-CM | POA: Insufficient documentation

## 2011-04-15 DIAGNOSIS — K5289 Other specified noninfective gastroenteritis and colitis: Secondary | ICD-10-CM | POA: Insufficient documentation

## 2011-04-15 DIAGNOSIS — K2980 Duodenitis without bleeding: Secondary | ICD-10-CM | POA: Insufficient documentation

## 2011-04-15 DIAGNOSIS — Z01812 Encounter for preprocedural laboratory examination: Secondary | ICD-10-CM | POA: Insufficient documentation

## 2011-04-15 DIAGNOSIS — K648 Other hemorrhoids: Secondary | ICD-10-CM | POA: Insufficient documentation

## 2011-04-15 DIAGNOSIS — R109 Unspecified abdominal pain: Secondary | ICD-10-CM

## 2011-04-15 DIAGNOSIS — I10 Essential (primary) hypertension: Secondary | ICD-10-CM | POA: Insufficient documentation

## 2011-04-15 DIAGNOSIS — K2970 Gastritis, unspecified, without bleeding: Secondary | ICD-10-CM

## 2011-04-15 DIAGNOSIS — Z01810 Encounter for preprocedural cardiovascular examination: Secondary | ICD-10-CM | POA: Insufficient documentation

## 2011-04-15 HISTORY — PX: SAVORY DILATION: SHX5439

## 2011-04-15 HISTORY — PX: OTHER SURGICAL HISTORY: SHX169

## 2011-04-15 LAB — POCT I-STAT 4, (NA,K, GLUC, HGB,HCT)
Glucose, Bld: 107 mg/dL — ABNORMAL HIGH (ref 70–99)
Potassium: 4.5 mEq/L (ref 3.5–5.1)
Sodium: 137 mEq/L (ref 135–145)

## 2011-04-15 SURGERY — COLONOSCOPY WITH PROPOFOL
Anesthesia: Monitor Anesthesia Care | Site: Throat

## 2011-04-15 MED ORDER — GLYCOPYRROLATE 0.2 MG/ML IJ SOLN
0.2000 mg | Freq: Once | INTRAMUSCULAR | Status: AC | PRN
  Administered 2011-04-15: 0.2 mg via INTRAVENOUS

## 2011-04-15 MED ORDER — PROPOFOL 10 MG/ML IV EMUL
INTRAVENOUS | Status: AC
  Filled 2011-04-15: qty 40

## 2011-04-15 MED ORDER — LACTATED RINGERS IV SOLN
INTRAVENOUS | Status: DC | PRN
  Administered 2011-04-15: 07:00:00 via INTRAVENOUS

## 2011-04-15 MED ORDER — FENTANYL CITRATE 0.05 MG/ML IJ SOLN
INTRAMUSCULAR | Status: AC
  Filled 2011-04-15: qty 2

## 2011-04-15 MED ORDER — BUTAMBEN-TETRACAINE-BENZOCAINE 2-2-14 % EX AERO
1.0000 | INHALATION_SPRAY | Freq: Once | CUTANEOUS | Status: AC
  Administered 2011-04-15: 1 via TOPICAL
  Filled 2011-04-15: qty 56

## 2011-04-15 MED ORDER — LIDOCAINE HCL (PF) 1 % IJ SOLN
INTRAMUSCULAR | Status: AC
  Filled 2011-04-15: qty 5

## 2011-04-15 MED ORDER — PROPOFOL 10 MG/ML IV EMUL
INTRAVENOUS | Status: DC | PRN
  Administered 2011-04-15: 50 ug/kg/min via INTRAVENOUS

## 2011-04-15 MED ORDER — MIDAZOLAM HCL 2 MG/2ML IJ SOLN
INTRAMUSCULAR | Status: AC
  Filled 2011-04-15: qty 2

## 2011-04-15 MED ORDER — FENTANYL CITRATE 0.05 MG/ML IJ SOLN: 25.0000 ug | INTRAMUSCULAR | Status: DC | PRN

## 2011-04-15 MED ORDER — GLYCOPYRROLATE 0.2 MG/ML IJ SOLN
INTRAMUSCULAR | Status: AC
  Administered 2011-04-15: 0.2 mg via INTRAVENOUS
  Filled 2011-04-15: qty 1

## 2011-04-15 MED ORDER — MIDAZOLAM HCL 2 MG/2ML IJ SOLN
1.0000 mg | INTRAMUSCULAR | Status: DC | PRN
  Administered 2011-04-15 (×2): 2 mg via INTRAVENOUS

## 2011-04-15 MED ORDER — EPHEDRINE SULFATE 50 MG/ML IJ SOLN
INTRAMUSCULAR | Status: DC | PRN
  Administered 2011-04-15 (×2): 10 mg via INTRAVENOUS

## 2011-04-15 MED ORDER — MIDAZOLAM HCL 5 MG/5ML IJ SOLN
INTRAMUSCULAR | Status: DC | PRN
  Administered 2011-04-15 (×2): 1 mg via INTRAVENOUS

## 2011-04-15 MED ORDER — MINERAL OIL PO OIL
TOPICAL_OIL | ORAL | Status: AC
  Filled 2011-04-15: qty 30

## 2011-04-15 MED ORDER — FENTANYL CITRATE 0.05 MG/ML IJ SOLN
INTRAMUSCULAR | Status: DC | PRN
Start: 2011-04-15 — End: 2011-04-15
  Administered 2011-04-15 (×4): 50 ug via INTRAVENOUS

## 2011-04-15 MED ORDER — ONDANSETRON HCL 4 MG/2ML IJ SOLN
INTRAMUSCULAR | Status: AC
  Administered 2011-04-15: 4 mg via INTRAVENOUS
  Filled 2011-04-15: qty 2

## 2011-04-15 MED ORDER — ONDANSETRON HCL 4 MG/2ML IJ SOLN
4.0000 mg | Freq: Once | INTRAMUSCULAR | Status: AC
  Administered 2011-04-15: 4 mg via INTRAVENOUS

## 2011-04-15 MED ORDER — ONDANSETRON HCL 4 MG/2ML IJ SOLN: 4.0000 mg | Freq: Once | INTRAMUSCULAR | Status: DC | PRN

## 2011-04-15 MED ORDER — LACTATED RINGERS IV SOLN
INTRAVENOUS | Status: DC
  Administered 2011-04-15: 07:00:00 via INTRAVENOUS

## 2011-04-15 MED ORDER — STERILE WATER FOR IRRIGATION IR SOLN
Status: DC | PRN
  Administered 2011-04-15: 08:00:00

## 2011-04-15 MED ORDER — WATER FOR IRRIGATION, STERILE IR SOLN: Status: DC | PRN

## 2011-04-15 MED ORDER — PROPOFOL 10 MG/ML IV EMUL
INTRAVENOUS | Status: AC
  Filled 2011-04-15: qty 20

## 2011-04-15 MED ORDER — LIDOCAINE HCL (CARDIAC) 10 MG/ML IV SOLN
INTRAVENOUS | Status: DC | PRN
  Administered 2011-04-15: 20 mg via INTRAVENOUS
  Administered 2011-04-15: 40 mg via INTRAVENOUS

## 2011-04-15 MED ORDER — BUTAMBEN-TETRACAINE-BENZOCAINE 2-2-14 % EX AERO
INHALATION_SPRAY | CUTANEOUS | Status: DC | PRN
  Administered 2011-04-15: 1 via TOPICAL

## 2011-04-15 MED ORDER — SIMETHICONE LIQD: Status: DC | PRN

## 2011-04-15 MED ORDER — PROPOFOL 10 MG/ML IV EMUL
INTRAVENOUS | Status: DC | PRN
  Administered 2011-04-15: 30 mg via INTRAVENOUS
  Administered 2011-04-15 (×3): 20 mg via INTRAVENOUS

## 2011-04-15 SURGICAL SUPPLY — 24 items
BLOCK BITE 60FR ADLT L/F BLUE (MISCELLANEOUS) ×4 IMPLANT
ELECT REM PT RETURN 9FT ADLT (ELECTROSURGICAL)
ELECTRODE REM PT RTRN 9FT ADLT (ELECTROSURGICAL) IMPLANT
FCP BXJMBJMB 240X2.8X (CUTTING FORCEPS)
FLOOR PAD 36X40 (MISCELLANEOUS) ×4
FORCEP RJ3 GP 1.8X160 W-NEEDLE (CUTTING FORCEPS) IMPLANT
FORCEPS BIOP RAD 4 LRG CAP 4 (CUTTING FORCEPS) ×8 IMPLANT
FORCEPS BIOP RJ4 240 W/NDL (CUTTING FORCEPS)
FORCEPS BXJMBJMB 240X2.8X (CUTTING FORCEPS) IMPLANT
INJECTOR/SNARE I SNARE (MISCELLANEOUS) IMPLANT
LUBRICANT JELLY 4.5OZ STERILE (MISCELLANEOUS) ×4 IMPLANT
MANIFOLD NEPTUNE II (INSTRUMENTS) ×4 IMPLANT
NEEDLE SCLEROTHERAPY 25GX240 (NEEDLE) IMPLANT
PAD FLOOR 36X40 (MISCELLANEOUS) ×3 IMPLANT
PROBE APC STR FIRE (PROBE) IMPLANT
PROBE INJECTION GOLD (MISCELLANEOUS)
PROBE INJECTION GOLD 7FR (MISCELLANEOUS) IMPLANT
SNARE ROTATE MED OVAL 20MM (MISCELLANEOUS) IMPLANT
SNARE SHORT THROW 13M SML OVAL (MISCELLANEOUS) IMPLANT
SYR 50ML LL SCALE MARK (SYRINGE) ×4 IMPLANT
TRAP SPECIMEN MUCOUS 40CC (MISCELLANEOUS) IMPLANT
TUBING ENDO SMARTCAP PENTAX (MISCELLANEOUS) ×8 IMPLANT
TUBING IRRIGATION ENDOGATOR (MISCELLANEOUS) ×4 IMPLANT
WATER STERILE IRR 1000ML POUR (IV SOLUTION) ×8 IMPLANT

## 2011-04-15 NOTE — Op Note (Addendum)
[facility] [address] [place], [place]  [address]  ENDOSCOPY PROCEDURE REPORT  PATIENT:  Yolanda Adams, Yolanda Adams  MR#:  [id number] BIRTHDATE:  01/10/1966, 45 yrs. old  GENDER:  female  ENDOSCOPIST:  Jonette Eva, MD Referred by:  Kizzie Furnish, NP-C  PROCEDURE DATE:  04/15/2011 PROCEDURE:  EGD with biopsy, 43239, EGD with dilatation over guidewire ASA CLASS: INDICATIONS:  dysphagia, diarrhea  MEDICATIONS:   MAC sedation, administered by CRNA TOPICAL ANESTHETIC:  Cetacaine Spray  DESCRIPTION OF PROCEDURE:     Physical exam was performed. Informed consent was obtained from the patient after explaining the benefits, risks, and alternatives to the procedure.  The patient was connected to the monitor and placed in the left lateral position.  Continuous oxygen was provided by nasal cannula and IV medicine administered through an indwelling cannula.  After administration of sedation, the patient's esophagus was intubated and the  endoscope was advanced under direct visualization to the second portion of the duodenum.  The scope was removed slowly by carefully examining the color, texture, anatomy, and integrity of the mucosa on the way out.  The patient was recovered in endoscopy and discharged home in satisfactory condition. <<PROCEDUREIMAGES>>  Moderate gastritis was found & BIOPSIED VIA COLD FORCEPS. Duodenitis was found. THE DUODENUM WAS BIOPSIED VIA COLD FORCEPS TO [place] FOR CELIAC SPRUE. UNABLE TO APPRECIATE DEFINITE STRICTURE. EMPIRIC DILATION PERFORMED DUE TO COMPLAINT OF DYSPHAGIA. COMPLICATIONS:    None  ENDOSCOPIC IMPRESSION: 1) Moderate gastritis 2) Duodenitis RECOMMENDATIONS: OMP qd Avoid asa and nsaids Await biopsies OPV APR 2013  REPEAT EXAM:  No  ______________________________ Jonette Eva, MD  CC:  n. REVISED:  04/15/2011 11:36 AM eSIGNED:   Andrej Spagnoli at 04/15/2011 11:36 AM  [name], [phone]

## 2011-04-15 NOTE — OR Nursing (Signed)
Dr. Jayme Cloud notified of patient stating she last used cocaine over a year ago, and of patient not being able to void this morning for a urine specimen. No urine drug screen needed per Dr. Jayme Cloud.

## 2011-04-15 NOTE — Preoperative (Addendum)
Beta Blockers   Reason not to administer Beta Blockers:Not Applicable 

## 2011-04-15 NOTE — Interval H&P Note (Signed)
History and Physical Interval Note:  04/15/2011 7:38 AM  Yolanda Adams  has presented today for surgery, with the diagnosis of dysphagia, chronic diarrhea and pain  The various methods of treatment have been discussed with the patient and family. After consideration of risks, benefits and other options for treatment, the patient has consented to  Procedure(s): COLONOSCOPY WITH PROPOFOL ESOPHAGOGASTRODUODENOSCOPY (EGD) WITH PROPOFOL SAVORY DILATION MALONEY DILATION as a surgical intervention .  The patients' history has been reviewed, patient examined, no change in status, stable for surgery.  I have reviewed the patients' chart and labs.  Questions were answered to the patient's satisfaction.     [employer]

## 2011-04-15 NOTE — Transfer of Care (Signed)
Immediate Anesthesia Transfer of Care Note  Patient: Yolanda Adams  Procedure(s) Performed:  COLONOSCOPY WITH PROPOFOL - In cecum @ 0809 cecal withdrawal time 15 minutes; procedure ended @ 0824; ESOPHAGOGASTRODUODENOSCOPY (EGD) WITH PROPOFOL; SAVORY DILATION - #17 used  Patient Location: PACU  Anesthesia Type: MAC  Level of Consciousness: awake, alert , oriented and patient cooperative  Airway & Oxygen Therapy: Patient Spontanous Breathing and Patient connected to nasal cannula oxygen  Post-op Assessment: Report given to PACU RN and Post -op Vital signs reviewed and stable  Post vital signs: Reviewed and stable  Complications: No apparent anesthesia complications

## 2011-04-15 NOTE — H&P (Signed)
BP  Pulse  Temp(Src)  Ht  Wt  BMI    114/78  102  97.6 F (36.4 C) (Temporal)  5\' 2"  (1.575 m)  170 lb 6.4 oz (77.293 kg)  31.17 kg/m2           LMP               03/28/2011              Vitals History Recorded            Progress Notes     Jonette Eva, MD 04/04/2011 9:42 AM Signed    Subjective:    Patient ID: Yolanda Adams, female DOB: [birth year], 45 y.o. MRN: [id number]  PCP: [facility]  HPI  Pain in right side & swelling. Bms: if eats then 10 mins watery stool. Pain in right side been there a couple of weeks. No radiation. Better: nothing except a little 5/500. Worse: not affected by eating or moving. Feels like she's going to throw up but she does not: off and on every day.Can see black stool. Keeps a thing of Pepto Bismol all the time. Hasn't had Bentyl in a while. Reports feeling warm and then freezes. Didn't check temperature. No blood in stool. Sees MH every 6 mos. aLSO SEES PSYCHOLOGIST Q3MOS. No weight loss. Appetite: "doesn't have one this week", but usu. Good. Swallowing: 3 times a week-solids. No neck surgery. Drinks decaf sweet tea. Using BCs this week-AM X1. IBUPROFEN: 1-2X/DAY. ETOH:NO     Past Medical History     Diagnosis  Date     .  GERD (gastroesophageal reflux disease)      .  PUD (peptic ulcer disease)      .  Anxiety and depression      .  DDD (degenerative disc disease)      .  COPD (chronic obstructive pulmonary disease)      .  HTN (hypertension)      .  Shoulder pain      .  IBS (irritable bowel syndrome)      .  Peptic stricture of esophagus        dilated twice 10/2009     .  Helicobacter pylori gastritis  June 2011       treated with Pylera         Past Surgical History     Procedure  Date     .  Cholecystectomy      .  Bladder surgery        x 3 in [place]     .  Oophorectomy        Right     .  Esophagogastroduodenoscopy  10/26/09 GASTRITIS/DUODENITIS       Distal esol stricture/dil 15-16 mm. A 17 mm  dilator would not pass     .  Upper gastrointestinal endoscopy  JUN 14, 2011DYSPHAGIA, EPIG PAIN       H PYLORI GASTRITIS      No Known Allergies     Current Outpatient Prescriptions     Medication  Sig  Dispense  Refill     .  acetaminophen (TYLENOL) 650 MG CR tablet  Take 650 mg by mouth every 8 (eight) hours as needed.       Marland Kitchen  albuterol (PROVENTIL HFA;VENTOLIN HFA) 108 (90 BASE) MCG/ACT inhaler  Inhale 2 puffs into the lungs every 6 (six) hours as needed. Shortness of breath       .  albuterol (PROVENTIL) (2.5 MG/3ML) 0.083% nebulizer solution  Take 2.5 mg by nebulization every 6 (six) hours as needed.       Marland Kitchen  albuterol-ipratropium (COMBIVENT) 18-103 MCG/ACT inhaler  Inhale 2 puffs into the lungs every 6 (six) hours as needed.       .  ALPRAZolam (XANAX) 1 MG tablet  Take 1 mg by mouth at bedtime as needed.       .  citalopram (CELEXA) 20 MG tablet  Take 20 mg by mouth at bedtime.       .  gabapentin (NEURONTIN) 300 MG capsule  Take 300 mg by mouth daily.       .  hydrochlorothiazide 25 MG tablet  Take 25 mg by mouth daily.       Marland Kitchen  HYDROcodone-acetaminophen (VICODIN) 5-500 MG per tablet  Take 1 tablet by mouth every 4 (four) hours as needed for pain.       Marland Kitchen  omeprazole (PRILOSEC) 20 MG capsule  Take 20 mg by mouth daily.       .  QUEtiapine (SEROQUEL) 100 MG tablet  Take 100 mg by mouth at bedtime.       .  traMADol (ULTRAM) 50 MG tablet  Take 50 mg by mouth every 6 (six) hours as needed.       .         .         Review of Systems  HAD URINARY INCONTINENCE AND "BLADDER SLING" PUT IN.  PER HPI OTHERWISE ALL SYSTEMS NEGATIVE.       Objective:      Physical Exam  Constitutional: She is oriented to person, place, and time. She appears well-developed and well-nourished. No distress.  HENT:  Head: Normocephalic and atraumatic.  Mouth/Throat: Oropharynx is clear and moist. No oropharyngeal exudate.  Eyes: Pupils are equal, round, and reactive to light. No scleral icterus.  Neck:  Normal range of motion. Neck supple.  Cardiovascular: Normal rate, regular rhythm and normal heart sounds.  Pulmonary/Chest: Effort normal and breath sounds normal. No respiratory distress.  Abdominal: Soft. She exhibits no distension. There is Tenderness: MOD TTP IN RUQ, MILD TTP IN LLQ.Marland Kitchen There is no rebound and no guarding.  POS CARNETT'S SIGN RUQ WITH HEAD AND ARMS OFF BED  Musculoskeletal: She exhibits no edema.  Lymphadenopathy:  She has no cervical adenopathy.  Neurological: She is alert and oriented to person, place, and time.  NO FOCAL DEFICITS  Psychiatric:  ANXIOUS MOOD        Assessment & Plan:       [place] Score 04/04/2011 9:45 AM Signed  Cc to PCP Ferne Reus 04/08/2011 4:12 PM Signed  Reminder in epic to follow up in 4 months Jonette Eva, MD 04/09/2011 9:55 AM Signed  Remind pt to drop off stool studies. Cloria Spring, LPN, LPN [id number]/7844 9:62 PM Signed  Nino Parsley Note:  LMOM stool studies negative.  ______      Right sided abdominal pain - Jonette Eva, MD 04/04/2011 9:42 AM Addendum  MOST LIKELY MUSCULO-SKELETAL. PAIN UNAFFECTED BY EATING. CT A/P 11/14-NAIAP. PT W/ HX:H PYLORI  HEAT TID TO RIGHT SIDE.  DECLINED TO GIVE PT NARCOTIC PAIN MEDS. SHE DECLINED A Rx FOR ULTRAM.  EGD-GASTRIC Bx to confirm eradication of h. Pylori. Previous Version  DIARRHEA, CHRONIC - Jonette Eva, MD 04/04/2011 9:37 AM Addendum  MOST LIKELY 2o TO IBS-d. DIFFERENTIAL DIAGNOSIS INCLUDES C DIFF, MICROSCOPIC COLITIS, GIARDIASIS, CELIAC SPRUE.  STOOL STUDIES.  RESTART BENTYL.  PEPTO PRN.  PHENERGAN PRN.  TCS/EGD W/ Bx COLON AND SMALL BOWEL  OPV in 4 mos.

## 2011-04-15 NOTE — Anesthesia Procedure Notes (Signed)
Procedure Name: MAC Date/Time: 04/15/2011 7:46 AM Performed by: Carolyne Littles, AMY Pre-anesthesia Checklist: Patient identified, Patient being monitored, Emergency Drugs available, Timeout performed and Suction available Patient Re-evaluated:Patient Re-evaluated prior to inductionOxygen Delivery Method: Simple face mask

## 2011-04-15 NOTE — H&P (View-Only) (Signed)
Subjective:    Patient ID: Yolanda Adams, female    DOB: 01/09/1966, 45 y.o.   MRN: [id number]  PCP: [facility]  HPI Pain in right side & swelling. Bms: if eats then 10 mins watery stool. Pain in right side been there a couple of weeks. No radiation. Better: nothing except a little 5/500. Worse: not affected by eating or moving. Feels like she's going to throw up but she does not: off and on every day.Can see black stool. Keeps a thing of Pepto Bismol all the time. Hasn't had Bentyl in a while. Reports feeling warm and then freezes. Didn't check temperature. No blood in stool. Sees MH every 6 mos. aLSO SEES PSYCHOLOGIST Q3MOS.  No weight loss. Appetite: "doesn't have one this week", but usu. Good.  Swallowing: 3 times a week-solids. No neck surgery. Drinks decaf sweet tea. Using BCs this week-AM X1. IBUPROFEN: 1-2X/DAY. ETOH:NO  Past Medical History  Diagnosis Date  . GERD (gastroesophageal reflux disease)   . PUD (peptic ulcer disease)   . Anxiety and depression   . DDD (degenerative disc disease)   . COPD (chronic obstructive pulmonary disease)   . HTN (hypertension)   . Shoulder pain   . IBS (irritable bowel syndrome)   . Peptic stricture of esophagus     dilated twice 10/2009  . Helicobacter pylori gastritis June 2011    treated with Pylera    Past Surgical History  Procedure Date  . Cholecystectomy   . Bladder surgery     x 3 in [place]  . Oophorectomy     Right  . Esophagogastroduodenoscopy 10/26/09 GASTRITIS/DUODENITIS    Distal esol stricture/dil 15-16 mm. A 17 mm dilator would not pass  . Upper gastrointestinal endoscopy JUN 14, 2011DYSPHAGIA, EPIG PAIN    H PYLORI GASTRITIS    No Known Allergies  Current Outpatient Prescriptions  Medication Sig Dispense Refill  . acetaminophen (TYLENOL) 650 MG CR tablet Take 650 mg by mouth every 8 (eight) hours as needed.        . albuterol (PROVENTIL HFA;VENTOLIN HFA) 108 (90 BASE) MCG/ACT inhaler Inhale 2 puffs into  the lungs every 6 (six) hours as needed. Shortness of breath       . albuterol (PROVENTIL) (2.5 MG/3ML) 0.083% nebulizer solution Take 2.5 mg by nebulization every 6 (six) hours as needed.        . albuterol-ipratropium (COMBIVENT) 18-103 MCG/ACT inhaler Inhale 2 puffs into the lungs every 6 (six) hours as needed.        . ALPRAZolam (XANAX) 1 MG tablet Take 1 mg by mouth at bedtime as needed.        . citalopram (CELEXA) 20 MG tablet Take 20 mg by mouth at bedtime.        . gabapentin (NEURONTIN) 300 MG capsule Take 300 mg by mouth daily.        . hydrochlorothiazide 25 MG tablet Take 25 mg by mouth daily.        . HYDROcodone-acetaminophen (VICODIN) 5-500 MG per tablet Take 1 tablet by mouth every 4 (four) hours as needed for pain.    . omeprazole (PRILOSEC) 20 MG capsule Take 20 mg by mouth daily.      . QUEtiapine (SEROQUEL) 100 MG tablet Take 100 mg by mouth at bedtime.      . traMADol (ULTRAM) 50 MG tablet Take 50 mg by mouth every 6 (six) hours as needed.      .      .             Review of Systems HAD URINARY INCONTINENCE AND "BLADDER SLING" PUT IN.  PER HPI OTHERWISE ALL SYSTEMS NEGATIVE.    Objective:   Physical Exam  Constitutional: She is oriented to person, place, and time. She appears well-developed and well-nourished. No distress.  HENT:  Head: Normocephalic and atraumatic.  Mouth/Throat: Oropharynx is clear and moist. No oropharyngeal exudate.  Eyes: Pupils are equal, round, and reactive to light. No scleral icterus.  Neck: Normal range of motion. Neck supple.  Cardiovascular: Normal rate, regular rhythm and normal heart sounds.   Pulmonary/Chest: Effort normal and breath sounds normal. No respiratory distress.  Abdominal: Soft. She exhibits no distension. There is Tenderness: MOD TTP IN RUQ, MILD TTP IN LLQ.. There is no rebound and no guarding.       POS CARNETT'S SIGN RUQ WITH HEAD AND ARMS OFF BED   Musculoskeletal: She exhibits no edema.  Lymphadenopathy:     She has no cervical adenopathy.  Neurological: She is alert and oriented to person, place, and time.       NO FOCAL DEFICITS   Psychiatric:       ANXIOUS MOOD          Assessment & Plan:   

## 2011-04-15 NOTE — Op Note (Signed)
[facility] [address] [place], [place]  [address]  COLONOSCOPY PROCEDURE REPORT  PATIENT:  Yolanda, Adams  MR#:  [id number] BIRTHDATE:  October 23, 1965, 45 yrs. old  GENDER:  female  ENDOSCOPIST:  Jonette Eva, MD REF. BY:  Kizzie Furnish, NP-C 1o GYN: DR. Ralph Dowdy ASSISTANT:  PROCEDURE DATE:  04/15/2011 PROCEDURE:  ILEOColonoscopy with biopsy  INDICATIONS:  DIARRHEA,ABDOMINAL PAIN  MEDICATIONS:   MAC sedation, administered by CRNA  DESCRIPTION OF PROCEDURE:    Physical exam was performed. Informed consent was obtained from the patient after explaining the benefits, risks, and alternatives to procedure.  The patient was connected to monitor and placed in left lateral position. Continuous oxygen was provided by nasal cannula and IV medicine administered through an indwelling cannula.  After administration of sedation and rectal exam, the patient's rectum was intubated and the  colonoscope was advanced under direct visualization to the cecum.  The scope was removed slowly by carefully examining the color, texture, anatomy, and integrity mucosa on the way out. The patient was recovered in endoscopy and discharged home in satisfactory condition. <<PROCEDUREIMAGES>>  FINDINGS:  Colitis was found in the sigmoid colon & BIOPSIED VIA OLD FORCEPS. MODERATE Internal Hemorrhoids were found. RANDOM BIOPSIES OBTAINED TO EVALUATE FOR CELIAC SPRUE. NL ILEUM   10 M VISUALIZED.  PREP QUALITY: GOOD CECAL W/D TIME:     15 minutes  COMPLICATIONS:    None  ENDOSCOPIC IMPRESSION: 1) Colitis in the sigmoid colon 2) Internal hemorrhoids  RECOMMENDATIONS: AWAIT BIOPSIES TCS IN 10 YEARS HIGH FIBER DIET FAX RESULT TO DR. Ralph Dowdy [phone]  REPEAT EXAM:  No  ______________________________ Jonette Eva, MD  CC:  Kizzie Furnish, NP-C  n. eSIGNED:   Jahmeir Adams at 04/15/2011 11:35 AM  [name], [phone]

## 2011-04-15 NOTE — Anesthesia Preprocedure Evaluation (Signed)
Anesthesia Evaluation  Patient identified by MRN, date of birth, ID band Patient awake    Reviewed: Allergy & Precautions, H&P , NPO status , Patient's Chart, lab work & pertinent test results  Airway Mallampati: II      Dental  (+) Teeth Intact   Pulmonary shortness of breath and with exertion, COPDCurrent Smoker,    Pulmonary exam normal       Cardiovascular hypertension, Pt. on medications Regular Normal    Neuro/Psych Seizures -, Well Controlled,     GI/Hepatic PUD, GERD-  Medicated,(+)     substance abuse (none x 2 yrs)  cocaine use,   Endo/Other    Renal/GU      Musculoskeletal   Abdominal   Peds  Hematology   Anesthesia Other Findings   Reproductive/Obstetrics                           Anesthesia Physical Anesthesia Plan  ASA: III  Anesthesia Plan: MAC   Post-op Pain Management:    Induction:   Airway Management Planned: Simple Face Mask  Additional Equipment:   Intra-op Plan:   Post-operative Plan:   Informed Consent: I have reviewed the patients History and Physical, chart, labs and discussed the procedure including the risks, benefits and alternatives for the proposed anesthesia with the patient or authorized representative who has indicated his/her understanding and acceptance.     Plan Discussed with:   Anesthesia Plan Comments:         Anesthesia Quick Evaluation

## 2011-04-15 NOTE — Anesthesia Postprocedure Evaluation (Signed)
  Anesthesia Post-op Note  Patient: Yolanda Adams  Procedure(s) Performed:  COLONOSCOPY WITH PROPOFOL - In cecum @ 0809 cecal withdrawal time 15 minutes; procedure ended @ 0824; ESOPHAGOGASTRODUODENOSCOPY (EGD) WITH PROPOFOL; SAVORY DILATION - #17 used  Patient Location: PACU  Anesthesia Type: MAC  Level of Consciousness: awake, alert  and oriented  Airway and Oxygen Therapy: Patient connected to nasal cannula oxygen  Post-op Pain: none  Post-op Assessment: Post-op Vital signs reviewed, Patient's Cardiovascular Status Stable, Respiratory Function Stable, Patent Airway, No signs of Nausea or vomiting and Pain level controlled  Post-op Vital Signs: Reviewed and stable  Complications: No apparent anesthesia complications

## 2011-04-16 ENCOUNTER — Ambulatory Visit (INDEPENDENT_AMBULATORY_CARE_PROVIDER_SITE_OTHER): Payer: Self-pay | Admitting: Urology

## 2011-04-16 DIAGNOSIS — N3010 Interstitial cystitis (chronic) without hematuria: Secondary | ICD-10-CM

## 2011-04-16 DIAGNOSIS — N949 Unspecified condition associated with female genital organs and menstrual cycle: Secondary | ICD-10-CM

## 2011-04-18 ENCOUNTER — Telehealth: Payer: Self-pay | Admitting: Gastroenterology

## 2011-04-18 NOTE — Telephone Encounter (Signed)
Called, many rings and no answer.

## 2011-04-18 NOTE — Telephone Encounter (Signed)
Please call pt. HER stomach Bx shows gastritis. HER SMALL BOWEL & COLON BIOPSIES ARE NORMAL. YOUR ABDOMINAL PAIN IS DUE TO GASTRITIS & IBS. Continue OMP 30 minutes prior to YOUR FIRST meal. TCS IN 10 YEARS. HIGH FIBER DIET. AVOID ITEMS THAT CAUSE BLOATING, PAIN, & GAS. OPV APR 2013.   FAX RESULTS & REPORT TO DR. Ralph Dowdy.

## 2011-04-18 NOTE — Telephone Encounter (Signed)
Results Cc to PCP  

## 2011-04-22 ENCOUNTER — Encounter (HOSPITAL_COMMUNITY): Payer: Self-pay | Admitting: Gastroenterology

## 2011-04-22 NOTE — Telephone Encounter (Signed)
LMOM to call.

## 2011-04-22 NOTE — Telephone Encounter (Signed)
Pt called back and is aware of results. Darl Pikes can NIC for office appointment

## 2011-05-01 NOTE — Telephone Encounter (Signed)
Reminder in epic to follow up in March 2013 °

## 2011-05-02 ENCOUNTER — Telehealth: Payer: Self-pay

## 2011-05-02 NOTE — Telephone Encounter (Signed)
Pt left 4 voice messages on my phone to have her CT results and OP reports sent to Dr Ralph Dowdy.  We did this last week, but I went ahead and re-faxed. I called patient this morning to let her know that they were sent around 0815 this morning and she could call them to make sure they received them.

## 2011-05-02 NOTE — Telephone Encounter (Signed)
Pt left VM that Dr. Ralph Dowdy needs copy of CT and EGD/TCS results faxed to him to continue with pt's assessment.

## 2011-05-06 ENCOUNTER — Encounter: Payer: Self-pay | Admitting: Gastroenterology

## 2011-05-06 NOTE — Telephone Encounter (Signed)
Pt scheduled for ov 04/25

## 2011-05-09 ENCOUNTER — Telehealth: Payer: Self-pay

## 2011-05-09 NOTE — Telephone Encounter (Signed)
Pt left VM that she is out of her phenergan and she would like an Rx sent to [facility]. ( She is changing pharmacies).

## 2011-05-10 NOTE — Telephone Encounter (Signed)
Rx called to John at the [facility] in [place].

## 2011-05-10 NOTE — Telephone Encounter (Signed)
PT MAY HAVE PHENERGAN 12.5 MG 1 PO Q6H PRN NAUSEA OR VOMITING, #30 [id number]

## 2011-05-16 ENCOUNTER — Encounter (HOSPITAL_COMMUNITY): Payer: Self-pay | Admitting: Emergency Medicine

## 2011-05-16 ENCOUNTER — Emergency Department (HOSPITAL_COMMUNITY)
Admission: EM | Admit: 2011-05-16 | Discharge: 2011-05-16 | Disposition: A | Discharge status: Home / Self Care | Payer: Self-pay | Attending: Emergency Medicine | Admitting: Emergency Medicine

## 2011-05-16 ENCOUNTER — Emergency Department (HOSPITAL_COMMUNITY): Payer: Self-pay

## 2011-05-16 DIAGNOSIS — M545 Low back pain, unspecified: Secondary | ICD-10-CM | POA: Insufficient documentation

## 2011-05-16 DIAGNOSIS — K589 Irritable bowel syndrome without diarrhea: Secondary | ICD-10-CM | POA: Insufficient documentation

## 2011-05-16 DIAGNOSIS — S161XXA Strain of muscle, fascia and tendon at neck level, initial encounter: Secondary | ICD-10-CM

## 2011-05-16 DIAGNOSIS — F341 Dysthymic disorder: Secondary | ICD-10-CM | POA: Insufficient documentation

## 2011-05-16 DIAGNOSIS — S139XXA Sprain of joints and ligaments of unspecified parts of neck, initial encounter: Secondary | ICD-10-CM | POA: Insufficient documentation

## 2011-05-16 DIAGNOSIS — M25519 Pain in unspecified shoulder: Secondary | ICD-10-CM | POA: Insufficient documentation

## 2011-05-16 DIAGNOSIS — K219 Gastro-esophageal reflux disease without esophagitis: Secondary | ICD-10-CM | POA: Insufficient documentation

## 2011-05-16 DIAGNOSIS — J449 Chronic obstructive pulmonary disease, unspecified: Secondary | ICD-10-CM | POA: Insufficient documentation

## 2011-05-16 DIAGNOSIS — K279 Peptic ulcer, site unspecified, unspecified as acute or chronic, without hemorrhage or perforation: Secondary | ICD-10-CM | POA: Insufficient documentation

## 2011-05-16 DIAGNOSIS — J4489 Other specified chronic obstructive pulmonary disease: Secondary | ICD-10-CM | POA: Insufficient documentation

## 2011-05-16 DIAGNOSIS — T7411XA Adult physical abuse, confirmed, initial encounter: Secondary | ICD-10-CM | POA: Insufficient documentation

## 2011-05-16 DIAGNOSIS — IMO0002 Reserved for concepts with insufficient information to code with codable children: Secondary | ICD-10-CM | POA: Insufficient documentation

## 2011-05-16 DIAGNOSIS — F17200 Nicotine dependence, unspecified, uncomplicated: Secondary | ICD-10-CM | POA: Insufficient documentation

## 2011-05-16 DIAGNOSIS — I10 Essential (primary) hypertension: Secondary | ICD-10-CM | POA: Insufficient documentation

## 2011-05-16 DIAGNOSIS — S39012A Strain of muscle, fascia and tendon of lower back, initial encounter: Secondary | ICD-10-CM

## 2011-05-16 DIAGNOSIS — M542 Cervicalgia: Secondary | ICD-10-CM | POA: Insufficient documentation

## 2011-05-16 DIAGNOSIS — S335XXA Sprain of ligaments of lumbar spine, initial encounter: Secondary | ICD-10-CM | POA: Insufficient documentation

## 2011-05-16 DIAGNOSIS — N189 Chronic kidney disease, unspecified: Secondary | ICD-10-CM | POA: Insufficient documentation

## 2011-05-16 MED ORDER — OXYCODONE-ACETAMINOPHEN 5-325 MG PO TABS
1.0000 | ORAL_TABLET | Freq: Once | ORAL | Status: AC
  Administered 2011-05-16: 1 via ORAL
  Filled 2011-05-16: qty 1

## 2011-05-16 MED ORDER — KETOROLAC TROMETHAMINE 60 MG/2ML IM SOLN
60.0000 mg | Freq: Once | INTRAMUSCULAR | Status: AC
  Administered 2011-05-16: 60 mg via INTRAMUSCULAR
  Filled 2011-05-16: qty 2

## 2011-05-16 MED ORDER — OXYCODONE-ACETAMINOPHEN 5-325 MG PO TABS: 1.0000 | ORAL_TABLET | ORAL | Status: AC | PRN

## 2011-05-16 NOTE — Discharge Instructions (Signed)
Cervical Strain A cervical strain is when the muscles or ligaments in the neck have been stretched. HOME CARE   Wear your neck collar as told. Do not remove any collar unless your doctor says it is okay.   Ask your doctor if you can remove the neck collar for:   Bathing.   Applying ice.   Put ice on the injured area.   Put ice in a plastic bag.   Place a towel between your skin and the bag.   Leave the ice on for 15 to 20 minutes, 3 to 4 times a day.   Do this for the first 2 days, or as told.   Only take medicine as told by your doctor.  Finding out the results of your test Ask when your test results will be ready. Make sure you get your test results. GET HELP RIGHT AWAY IF:   You have problems swallowing.   You have trouble breathing.   You feel numb.   You have weakness or problems moving your arms or legs.   The pain is increasing and does not get better with medicine.  MAKE SURE YOU:   Understand these instructions.   Will watch this condition.   Will get help right away if you or your child is not doing well or gets worse.  Document Released: 10/09/2007 Document Revised: 01/02/2011 Document Reviewed: 10/09/2007 [facility] Patient Information 2012 [place], [place].Lumbosacral Strain Lumbosacral strain is one of the most common causes of back pain. There are many causes of back pain. Most are not serious conditions. CAUSES  Your backbone (spinal column) is made up of 24 main vertebral bodies, the sacrum, and the coccyx. These are held together by muscles and tough, fibrous tissue (ligaments). Nerve roots pass through the openings between the vertebrae. A sudden move or injury to the back may cause injury to, or pressure on, these nerves. This may result in localized back pain or pain movement (radiation) into the buttocks, down the leg, and into the foot. Sharp, shooting pain from the buttock down the back of the leg (sciatica) is frequently associated with a ruptured  (herniated) disk. Pain may be caused by muscle spasm alone. Your caregiver can often find the cause of your pain by the details of your symptoms and an exam. In some cases, you may need tests (such as X-rays). Your caregiver will work with you to decide if any tests are needed based on your specific exam. HOME CARE INSTRUCTIONS   Avoid an underactive lifestyle. Active exercise, as directed by your caregiver, is your greatest weapon against back pain.   Avoid hard physical activities (tennis, racquetball, waterskiing) if you are not in proper physical condition for it. This may aggravate or create problems.   If you have a back problem, avoid sports requiring sudden body movements. Swimming and walking are generally safer activities.   Maintain good posture.   Avoid becoming overweight (obese).   Use bed rest for only the most extreme, sudden (acute) episode. Your caregiver will help you determine how much bed rest is necessary.   For acute conditions, you may put ice on the injured area.   Put ice in a plastic bag.   Place a towel between your skin and the bag.   Leave the ice on for 15 to 20 minutes at a time, every 2 hours, or as needed.   After you are improved and more active, it may help to apply heat for 30 minutes before  activities.  See your caregiver if you are having pain that lasts longer than expected. Your caregiver can advise appropriate exercises or therapy if needed. With conditioning, most back problems can be avoided. SEEK IMMEDIATE MEDICAL CARE IF:   You have numbness, tingling, weakness, or problems with the use of your arms or legs.   You experience severe back pain not relieved with medicines.   There is a change in bowel or bladder control.   You have increasing pain in any area of the body, including your belly (abdomen).   You notice shortness of breath, dizziness, or feel faint.   You feel sick to your stomach (nauseous), are throwing up (vomiting), or  become sweaty.   You notice discoloration of your toes or legs, or your feet get very cold.   Your back pain is getting worse.   You have a fever.  MAKE SURE YOU:   Understand these instructions.   Will watch your condition.   Will get help right away if you are not doing well or get worse.  Document Released: 01/30/2005 Document Revised: 01/02/2011 Document Reviewed: 07/22/2008 [facility] Patient Information 2012 [place], [place].   Use a heating pad (or sit in a warm tub of water) several times daily for 20 minutes.  You may use the medicine prescribed.  Do not drive within 4 hours of taking percocet as this will make you sleepy.
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
## 2011-05-18 NOTE — ED Provider Notes (Signed)
Medical screening examination/treatment/procedure(s) were performed by non-physician practitioner and as supervising physician I was immediately available for consultation/collaboration.  Nicoletta Dress. Colon Branch, MD 05/18/11 2223

## 2011-05-20 IMAGING — CR DG HIP (WITH OR WITHOUT PELVIS) 2-3V*L*
3 series · 3 of 3 positions shown · non-contrast
Comparison: None.

CLINICAL DATA: Left hip pain

LEFT HIP - COMPLETE 2+ VIEW

[view not recorded (1 of 3)]
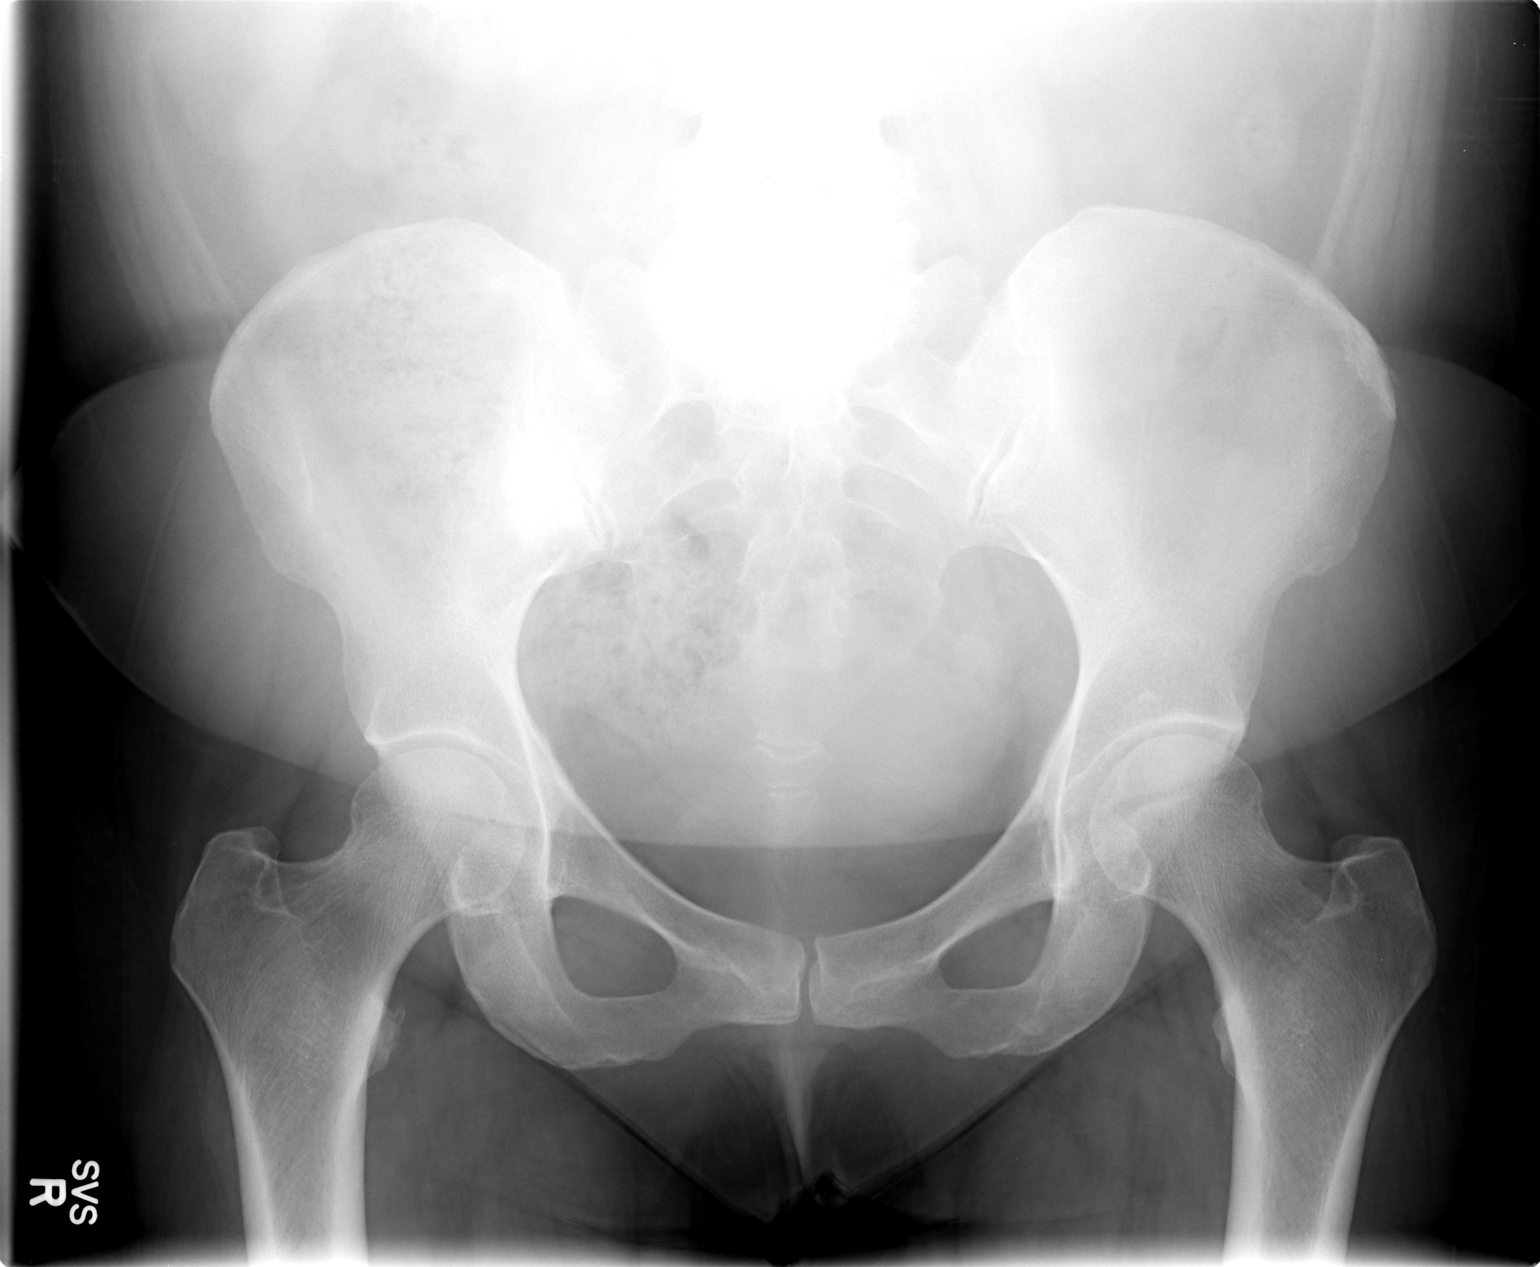

[view not recorded (2 of 3)]
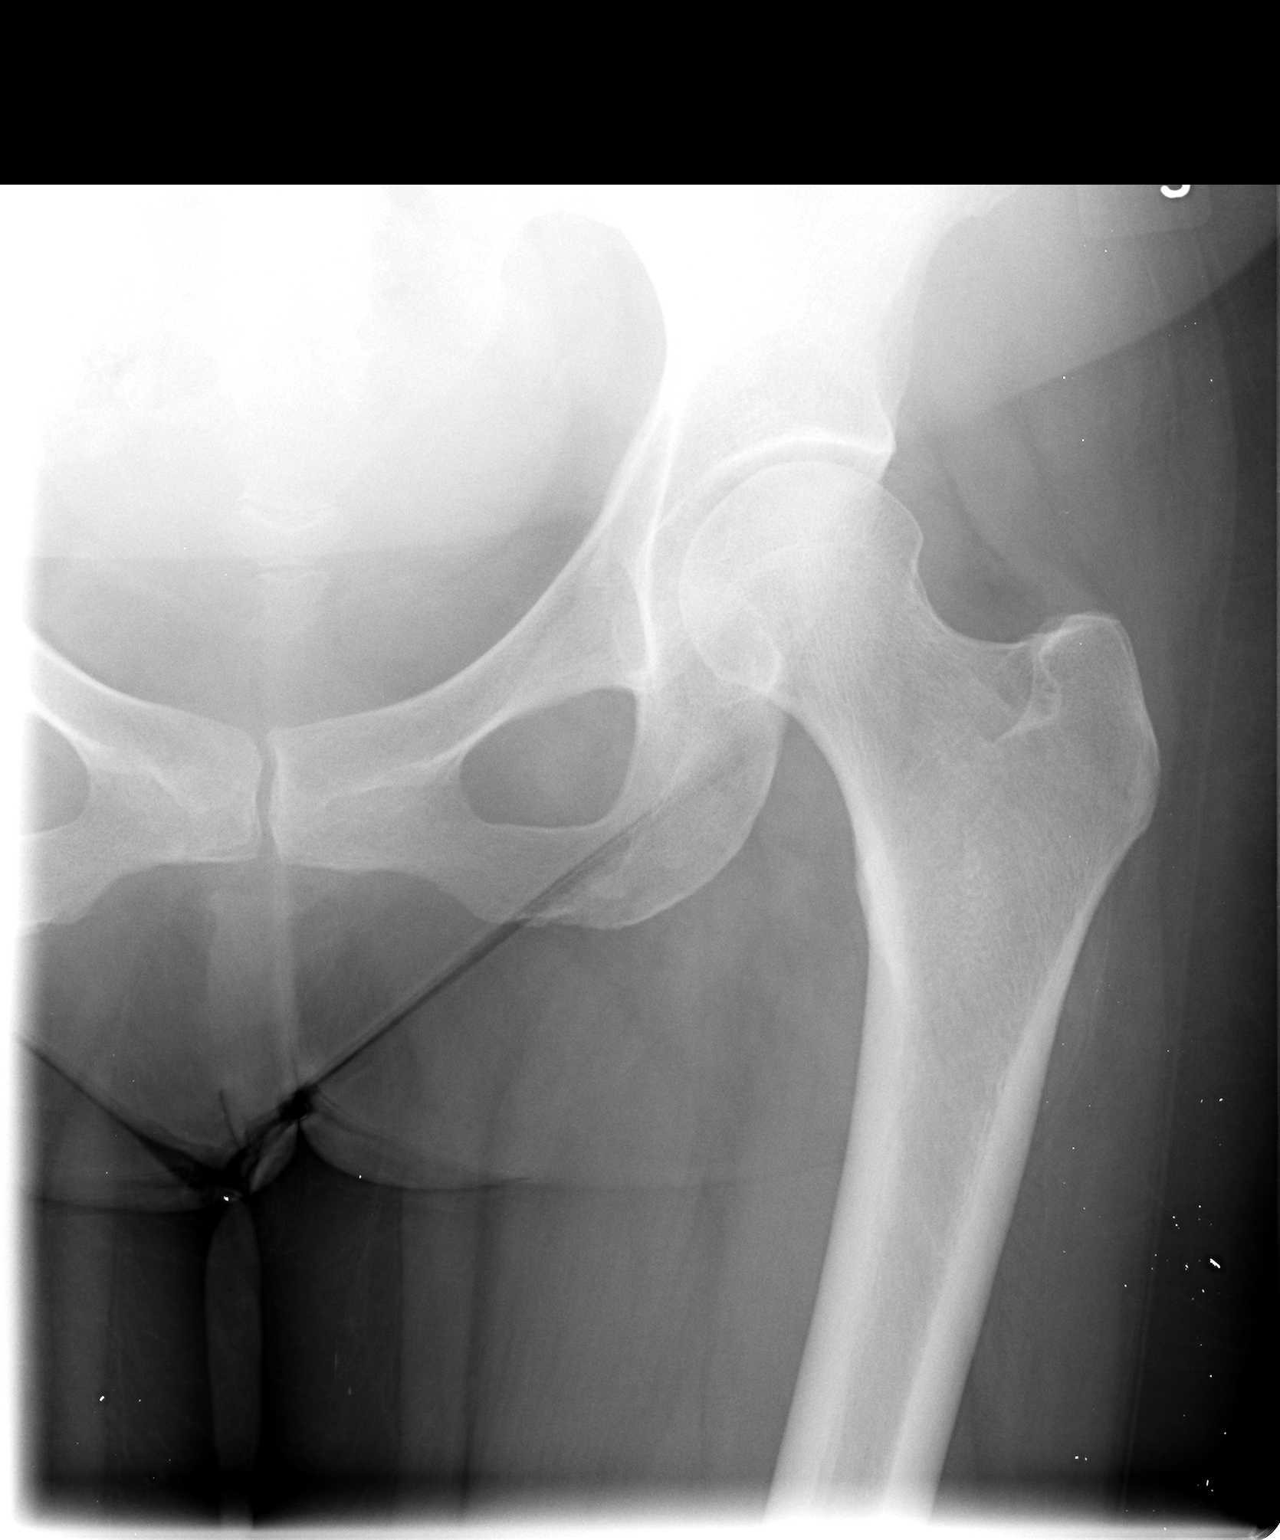

[view not recorded (3 of 3)]
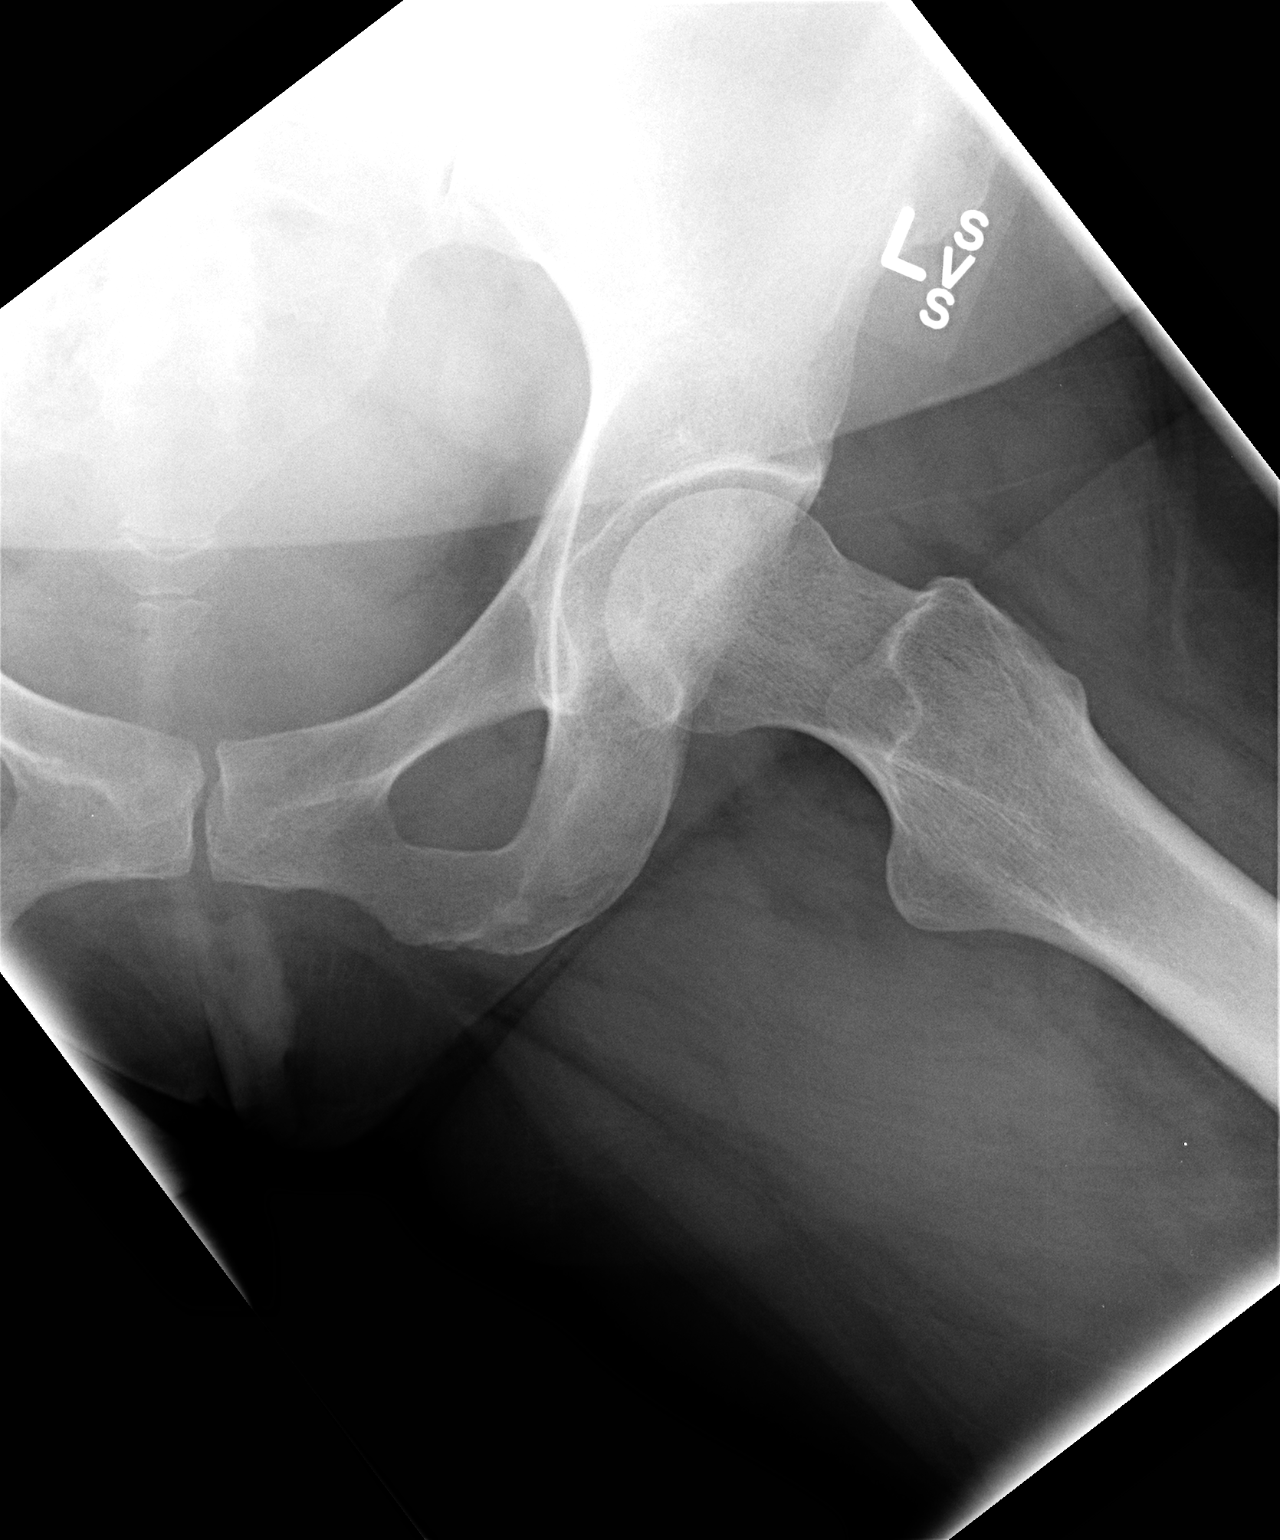

[3 of 3 positions shown; findings below may reference images not displayed]

FINDINGS: Left hip is intact.  No fracture or malalignment.  Bony
pelvis and right hip are intact as well.  Symmetric SI joints with
slight sclerosis noted on the right.  This can be seen with mild
developing arthropathy.  No diastasis.
IMPRESSION: No acute osseous finding

Right SI joint sclerosis suspicious for arthropathy.

## 2011-06-04 ENCOUNTER — Telehealth: Payer: Self-pay | Admitting: Gastroenterology

## 2011-06-04 NOTE — Telephone Encounter (Signed)
Patient is asking for a refill on percocet and phenergan called to [facility] in [place] please advise??

## 2011-06-05 NOTE — Telephone Encounter (Signed)
Called and spoke with pt. She said she is having abdominal pain almost constantly with nausea. She is not having any diarrhea and no vomiting.Had normal BM yesterday, no constipation. She had called and requested Percocet and phenergan. Please advise!

## 2011-06-05 NOTE — Telephone Encounter (Signed)
pLEASE CALL PT. SHE SHOULD SEE HER PCP TO CHECK FOR A UTI. SHE MAY HAVE PHENERGAN 25 MG 1 PO Q6H PRN FOR NAUSEA OR VOMITING, #30, RFX2. WE DO NOT PRESCRIBE NARCOTICS FOR CHRONIC ABDOMINAL PAIN.

## 2011-06-05 NOTE — Telephone Encounter (Signed)
Called Rx to John at the [facility]. Called pt, lmom the info and the Rx has been called in .

## 2011-08-29 ENCOUNTER — Ambulatory Visit: Payer: Self-pay | Admitting: Gastroenterology

## 2011-08-29 ENCOUNTER — Telehealth: Payer: Self-pay | Admitting: Gastroenterology

## 2011-08-29 NOTE — Telephone Encounter (Signed)
REVIEWED.  

## 2011-08-29 NOTE — Telephone Encounter (Signed)
Pt was a no show

## 2012-01-20 ENCOUNTER — Encounter: Payer: Self-pay | Admitting: Gastroenterology

## 2012-01-21 ENCOUNTER — Ambulatory Visit: Payer: Self-pay | Admitting: Gastroenterology

## 2012-02-07 ENCOUNTER — Ambulatory Visit: Payer: Self-pay | Admitting: Urgent Care

## 2012-02-07 ENCOUNTER — Telehealth: Payer: Self-pay | Admitting: *Deleted

## 2012-02-07 NOTE — Telephone Encounter (Signed)
Pt was a no show

## 2012-02-07 NOTE — Telephone Encounter (Signed)
No Show x 2 this year noted.

## 2012-02-18 NOTE — Progress Notes (Signed)
Quick Note:  Please let patient know. I realize this is a CT from 03/2011 but I had it in my inbasket to address with the patient. She no showed a couple of appointments so it has not gotten addressed.  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 I have looked throughout many medical sites and discussed with a radiologist. There is no data or guidelines regarding what sort of f/u this needs but can rarely convert into abnormal cells. I would recommend she have a one time consultation with a urologist to find out their input for this.   If you cannot get in touch with the patient, we should send a letter. Thanks. ______

## 2012-02-18 NOTE — Progress Notes (Signed)
Quick Note:  LM for pt to call. ______ 

## 2012-02-19 ENCOUNTER — Telehealth: Payer: Self-pay | Admitting: Gastroenterology

## 2012-02-19 NOTE — Telephone Encounter (Signed)
See documentation under CT results. I have spoke to pt and her mom per her request.

## 2012-02-19 NOTE — Progress Notes (Signed)
Patient was seen in Dec by Dr. Dalstead at [facility] and he did recommend her to follow through with DMSO treatments but the patient opted not to so no follow up was ever made, please advise if there will be further recommendations        

## 2012-02-19 NOTE — Progress Notes (Signed)
Quick Note:  Pt called back and was informed. She would like the referral made to urologist within the [facility] because she has [facility] [employer]. No preference except she does not want Dr. Letha Cape. Routing to Soledad Gerlach to make referral. ______

## 2012-02-19 NOTE — Telephone Encounter (Signed)
Pt is on the phone. She said someone called her yesterday and today. Doesn't know who it was or what its about. I told her that the nurse was trying to reach her and patient has multiple questions that I can not answer. She is wanting to hold for nurse to speak with her.

## 2012-02-19 NOTE — Progress Notes (Signed)
Quick Note:  Mailed letter for pt to call. ______ 

## 2012-02-24 ENCOUNTER — Telehealth: Payer: Self-pay | Admitting: *Deleted

## 2012-02-24 NOTE — Telephone Encounter (Signed)
Ms Mccafferty called over the weekend returning your call. She also has called Dr Belva Crome office to set up an appt with them. She would like for you to call her back.  Thanks.

## 2012-02-26 NOTE — Telephone Encounter (Signed)
LM for pt to call

## 2012-03-12 ENCOUNTER — Encounter: Payer: Self-pay | Admitting: Gastroenterology

## 2012-03-13 ENCOUNTER — Ambulatory Visit: Payer: Self-pay | Admitting: Urgent Care

## 2012-03-13 ENCOUNTER — Telehealth: Payer: Self-pay | Admitting: Urgent Care

## 2012-03-13 NOTE — Telephone Encounter (Signed)
Pt was a no show

## 2012-03-13 NOTE — Telephone Encounter (Signed)
NSx2. 

## 2012-03-16 ENCOUNTER — Telehealth: Payer: Self-pay

## 2012-03-16 NOTE — Telephone Encounter (Signed)
Patient is complaining of urological problems and should call her urologist. She can always go to ED if severe pain.  She no showed 08/28/11, 02/07/12, 03/13/12.

## 2012-03-16 NOTE — Telephone Encounter (Signed)
REVIEWED.  

## 2012-03-16 NOTE — Telephone Encounter (Signed)
Patient was seen in Dec by Dr. Letha Cape at [facility] and he did recommend her to follow through with DMSO treatments but the patient opted not to so no follow up was ever made, please advise if there will be further recommendations

## 2012-03-16 NOTE — Telephone Encounter (Signed)
Yolanda Adams is scheduled with Dr. Letha Cape on 05/12/12 at 1:45 and Yolanda Adams is aware and records have been sent

## 2012-03-16 NOTE — Telephone Encounter (Signed)
Pt called and was crying. Said she hurt so bad when she tried to urinate and she needed some medicine. I told her she should see her PCP and she said that was the health dept and they would not do anything. I told her Dr. Darrick Penna does not treat bladder problems. She begged me to please ask Dr. Darrick Penna to give her something. I told her if she hurt that bad she should go to the ED. ( I spoke to her earlier this AM when she called in reference to the Urologist referral, and she wanted to reschedule her appt that she didn't make on Friday. She never mentioned any problem with urinating and was not crying at that time. Please advise!

## 2012-03-16 NOTE — Telephone Encounter (Signed)
Called and informed pt.  

## 2012-03-16 NOTE — Telephone Encounter (Signed)
Pt called and wanted me to go over her results again of her CT done on 03/20/2011 and see note of 02/19/2012 by Tana Coast, PA, recommendations for a urologist. I went over the info again and told her that Soledad Gerlach will call her at [phone], she really doesn't know who she wants to see for the urology consult.

## 2012-03-16 NOTE — Telephone Encounter (Signed)
This is a separate issue from DMSO treatments.  I need her to see a urologist regarding abnormal CT.   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 I have looked throughout many medical sites and discussed with a radiologist. There is no data or guidelines regarding what sort of f/u this needs but can rarely convert into abnormal cells. I would recommend she have a one time consultation with a urologist to find out their input for this.

## 2012-03-24 ENCOUNTER — Ambulatory Visit: Payer: Self-pay | Admitting: Urology

## 2012-04-09 ENCOUNTER — Telehealth: Payer: Self-pay | Admitting: *Deleted

## 2012-04-09 ENCOUNTER — Ambulatory Visit: Payer: Self-pay | Admitting: Gastroenterology

## 2012-04-09 NOTE — Telephone Encounter (Signed)
Pt was a no show

## 2012-04-09 NOTE — Telephone Encounter (Signed)
D/C letter mailed. 

## 2012-04-09 NOTE — Telephone Encounter (Signed)
PHONE CALL MADE YESTERDAY TO REMIND PT OF [facility]. PT HAS BEEN A NO SHOW FIVE TIMES IN 2013. PT NEEDS TO BE DISCHARGED FROM THE PRACTICE.

## 2012-05-14 NOTE — Progress Notes (Signed)
Discharge letter which was sent certified mail was returned unclaimed. Resent letter by Stephens Shire. Copy of envelope sent to be scanned.

## 2012-12-23 ENCOUNTER — Emergency Department (HOSPITAL_COMMUNITY)
Admission: EM | Admit: 2012-12-23 | Discharge: 2012-12-23 | Disposition: A | Discharge status: Home / Self Care | Payer: Self-pay | Attending: Emergency Medicine | Admitting: Emergency Medicine

## 2012-12-23 ENCOUNTER — Encounter (HOSPITAL_COMMUNITY): Payer: Self-pay | Admitting: *Deleted

## 2012-12-23 DIAGNOSIS — Z8709 Personal history of other diseases of the respiratory system: Secondary | ICD-10-CM | POA: Insufficient documentation

## 2012-12-23 DIAGNOSIS — K219 Gastro-esophageal reflux disease without esophagitis: Secondary | ICD-10-CM | POA: Insufficient documentation

## 2012-12-23 DIAGNOSIS — I129 Hypertensive chronic kidney disease with stage 1 through stage 4 chronic kidney disease, or unspecified chronic kidney disease: Secondary | ICD-10-CM | POA: Insufficient documentation

## 2012-12-23 DIAGNOSIS — K589 Irritable bowel syndrome without diarrhea: Secondary | ICD-10-CM | POA: Insufficient documentation

## 2012-12-23 DIAGNOSIS — F17200 Nicotine dependence, unspecified, uncomplicated: Secondary | ICD-10-CM | POA: Insufficient documentation

## 2012-12-23 DIAGNOSIS — Z8739 Personal history of other diseases of the musculoskeletal system and connective tissue: Secondary | ICD-10-CM | POA: Insufficient documentation

## 2012-12-23 DIAGNOSIS — Z8719 Personal history of other diseases of the digestive system: Secondary | ICD-10-CM | POA: Insufficient documentation

## 2012-12-23 DIAGNOSIS — Z8711 Personal history of peptic ulcer disease: Secondary | ICD-10-CM | POA: Insufficient documentation

## 2012-12-23 DIAGNOSIS — R3911 Hesitancy of micturition: Secondary | ICD-10-CM | POA: Insufficient documentation

## 2012-12-23 DIAGNOSIS — Z8619 Personal history of other infectious and parasitic diseases: Secondary | ICD-10-CM | POA: Insufficient documentation

## 2012-12-23 DIAGNOSIS — N949 Unspecified condition associated with female genital organs and menstrual cycle: Secondary | ICD-10-CM | POA: Insufficient documentation

## 2012-12-23 DIAGNOSIS — N898 Other specified noninflammatory disorders of vagina: Secondary | ICD-10-CM | POA: Insufficient documentation

## 2012-12-23 DIAGNOSIS — IMO0002 Reserved for concepts with insufficient information to code with codable children: Secondary | ICD-10-CM | POA: Insufficient documentation

## 2012-12-23 DIAGNOSIS — R102 Pelvic and perineal pain: Secondary | ICD-10-CM

## 2012-12-23 DIAGNOSIS — Z90710 Acquired absence of both cervix and uterus: Secondary | ICD-10-CM | POA: Insufficient documentation

## 2012-12-23 DIAGNOSIS — J4489 Other specified chronic obstructive pulmonary disease: Secondary | ICD-10-CM | POA: Insufficient documentation

## 2012-12-23 DIAGNOSIS — Z9079 Acquired absence of other genital organ(s): Secondary | ICD-10-CM | POA: Insufficient documentation

## 2012-12-23 DIAGNOSIS — Z87442 Personal history of urinary calculi: Secondary | ICD-10-CM | POA: Insufficient documentation

## 2012-12-23 DIAGNOSIS — N189 Chronic kidney disease, unspecified: Secondary | ICD-10-CM | POA: Insufficient documentation

## 2012-12-23 DIAGNOSIS — G40909 Epilepsy, unspecified, not intractable, without status epilepticus: Secondary | ICD-10-CM | POA: Insufficient documentation

## 2012-12-23 DIAGNOSIS — F341 Dysthymic disorder: Secondary | ICD-10-CM | POA: Insufficient documentation

## 2012-12-23 DIAGNOSIS — R110 Nausea: Secondary | ICD-10-CM | POA: Insufficient documentation

## 2012-12-23 DIAGNOSIS — Z9089 Acquired absence of other organs: Secondary | ICD-10-CM | POA: Insufficient documentation

## 2012-12-23 DIAGNOSIS — J449 Chronic obstructive pulmonary disease, unspecified: Secondary | ICD-10-CM | POA: Insufficient documentation

## 2012-12-23 DIAGNOSIS — Z79899 Other long term (current) drug therapy: Secondary | ICD-10-CM | POA: Insufficient documentation

## 2012-12-23 LAB — COMPREHENSIVE METABOLIC PANEL
AST: 37 U/L (ref 0–37)
Albumin: 3.5 g/dL (ref 3.5–5.2)
Alkaline Phosphatase: 102 U/L (ref 39–117)
Chloride: 100 mEq/L (ref 96–112)
Potassium: 3.9 mEq/L (ref 3.5–5.1)
Total Bilirubin: 0.2 mg/dL — ABNORMAL LOW (ref 0.3–1.2)

## 2012-12-23 LAB — CBC WITH DIFFERENTIAL/PLATELET
Basophils Absolute: 0 10*3/uL (ref 0.0–0.1)
Basophils Relative: 1 % (ref 0–1)
Hemoglobin: 12.2 g/dL (ref 12.0–15.0)
MCHC: 34.3 g/dL (ref 30.0–36.0)
Neutro Abs: 5 10*3/uL (ref 1.7–7.7)
Neutrophils Relative %: 63 % (ref 43–77)
RDW: 12.5 % (ref 11.5–15.5)

## 2012-12-23 LAB — URINALYSIS, ROUTINE W REFLEX MICROSCOPIC
Glucose, UA: NEGATIVE mg/dL
Hgb urine dipstick: NEGATIVE
Ketones, ur: NEGATIVE mg/dL
Protein, ur: NEGATIVE mg/dL

## 2012-12-23 LAB — WET PREP, GENITAL: Trich, Wet Prep: NONE SEEN

## 2012-12-23 MED ORDER — ONDANSETRON 8 MG PO TBDP
8.0000 mg | ORAL_TABLET | Freq: Once | ORAL | Status: AC
  Administered 2012-12-23: 8 mg via ORAL
  Filled 2012-12-23: qty 1

## 2012-12-23 MED ORDER — OXYCODONE-ACETAMINOPHEN 5-325 MG PO TABS: 1.0000 | ORAL_TABLET | ORAL | Status: DC | PRN

## 2012-12-23 MED ORDER — HYDROMORPHONE HCL PF 1 MG/ML IJ SOLN
1.0000 mg | Freq: Once | INTRAMUSCULAR | Status: AC
  Administered 2012-12-23: 1 mg via INTRAMUSCULAR
  Filled 2012-12-23: qty 1

## 2012-12-23 MED ORDER — HYDROMORPHONE HCL PF 2 MG/ML IJ SOLN
2.0000 mg | Freq: Once | INTRAMUSCULAR | Status: AC
  Administered 2012-12-23: 2 mg via INTRAMUSCULAR
  Filled 2012-12-23: qty 1

## 2012-12-23 NOTE — ED Notes (Addendum)
Pt states pain to lower abdomen. States she believes it is from complications of bladder sling. Pt states sling was placed 4 years ago and has recently began having trouble. Pt states she was seen at [facility] 2 years ago but they refused to remove sling. Pt also states vaginal discharge, yellow in color. Pt also states lower back pain with pain radiating down both legs.

## 2012-12-23 NOTE — ED Notes (Signed)
Pt c/o lower abdominal pain x2-3 days. Pt also reports white vaginal discharge. Pt states "I have a bladder sling and I think something is wrong with it". Pt also reports dysuria.

## 2012-12-23 NOTE — Progress Notes (Signed)
ED/CM noted patient did not have health insurance and/or PCP listed in the computer.  Patient was given the [place] resource handout with information on the clinics, food pantries, and the handout for new health insurance sign-up.  Patient expressed appreciation for this. 

## 2012-12-23 NOTE — ED Provider Notes (Signed)
CSN: [id number]     Arrival date & time 12/23/12  1228 History  This chart was scribed for Flint Melter, MD by Blanchard Kelch and Shari Heritage, ED Scribes. The patient was seen in room [facility]12/[facility]12. Patient's care was started at 1:56 PM.    Chief Complaint  Patient presents with  . Abdominal Pain    The history is provided by the patient. No language interpreter was used.   HPI Comments: Yolanda Adams is a 47 y.o. female who presents to the Emergency Department complaining of moderate to severe, lower abdominal pain onset 2-3 days ago. Pain is worse with certain positions. She has been taking Tylenol and Ibuprofen with no relief. There is associated hesitancy and nausea. She also reports yellow vaginal discharge. Patient denies dysuria, hematuria, fever and vomiting. Patient has a medical history of GERD, IBS, COPD, peptic ulcer disease, HTN, DDD. Her surgical history includes oophorectomy and abdominal hysterectomy. Patient is currently taking Combivent, but no other medicines regularly. She states that she last saw her PCP 6 months ago to get a refill on Combivent.   PCP - [facility]   Past Medical History  Diagnosis Date  . GERD (gastroesophageal reflux disease)   . PUD (peptic ulcer disease)   . Anxiety and depression   . DDD (degenerative disc disease)   . COPD (chronic obstructive pulmonary disease)   . HTN (hypertension)   . Shoulder pain   . IBS (irritable bowel syndrome)   . Peptic stricture of esophagus     dilated twice 10/2009  . Helicobacter pylori gastritis June 2011    treated with Pylera  . Shortness of breath     continuous  . Chronic kidney disease     small rt kidney stone  . Seizures     had 1 episode of it about 5 years ago, no other hx of seizures   Past Surgical History  Procedure Laterality Date  . Cholecystectomy    . Bladder surgery      x 3 in [place]-strected twice and had bladder sling placed  . Oophorectomy      Right   . Esophagogastroduodenoscopy  10/26/09 GASTRITIS/DUODENITIS    Distal esol stricture/dil 15-16 mm. A 17 mm dilator would not pass  . Upper gastrointestinal endoscopy  JUN 14, 2011DYSPHAGIA, EPIG PAIN    H PYLORI GASTRITIS  . Steroid shot in back    . Savory dilation  04/15/2011    WUJ:[id number] gastritis/Duodenitis  . Ileocolonoscopy  04/15/2011    SLF: Colitis in the sigmoid colon/Internal hemorrhoids  . Abdominal hysterectomy     Family History  Problem Relation Age of Onset  . Prostate cancer Father     Partial gastrectomy  . Colon cancer Neg Hx   . Anesthesia problems Neg Hx   . Hypotension Neg Hx   . Malignant hyperthermia Neg Hx   . Pseudochol deficiency Neg Hx    History  Substance Use Topics  . Smoking status: Current Every Day Smoker -- 1.00 packs/day for 31 years    Types: Cigarettes  . Smokeless tobacco: Not on file  . Alcohol Use: No   OB History   Grav Para Term Preterm Abortions TAB SAB Ect Mult Living                 Review of Systems  Constitutional: Negative for fever.  Gastrointestinal: Positive for nausea and abdominal pain. Negative for vomiting.  Genitourinary: Negative for dysuria and hematuria.  Positive for hesitancy.  All other systems reviewed and are negative.    Allergies  Review of patient's allergies indicates no known allergies.  Home Medications   Current Outpatient Rx  Name  Route  Sig  Dispense  Refill  . albuterol-ipratropium (COMBIVENT) 18-103 MCG/ACT inhaler   Inhalation   Inhale 2 puffs into the lungs every 6 (six) hours as needed for wheezing or shortness of breath.          . ALPRAZolam (XANAX) 1 MG tablet   Oral   Take 1 mg by mouth 4 (four) times daily. anxiety         . citalopram (CELEXA) 20 MG tablet   Oral   Take 20 mg by mouth at bedtime.           . dicyclomine (BENTYL) 10 MG capsule   Oral   Take 20 mg by mouth 4 (four) times daily -  before meals and at bedtime.           . gabapentin  (NEURONTIN) 300 MG capsule   Oral   Take 300 mg by mouth daily.           . hydrochlorothiazide 25 MG tablet   Oral   Take 12.5 mg by mouth daily.          Marland Kitchen ibuprofen (ADVIL,MOTRIN) 200 MG tablet   Oral   Take 800 mg by mouth every 6 (six) hours as needed for pain.         Marland Kitchen omeprazole (PRILOSEC) 20 MG capsule   Oral   Take 20 mg by mouth daily.           . QUEtiapine (SEROQUEL) 100 MG tablet   Oral   Take 100 mg by mouth at bedtime.          . traMADol (ULTRAM) 50 MG tablet   Oral   Take 50 mg by mouth every 6 (six) hours as needed. For pain         . oxyCODONE-acetaminophen (PERCOCET) 5-325 MG per tablet   Oral   Take 1 tablet by mouth every 4 (four) hours as needed for pain.   20 tablet   0    Triage Vitals: BP 96/77  Pulse 100  Temp(Src) 98.2 F (36.8 C) (Oral)  Resp 20  Ht 5\' 2"  (1.575 m)  Wt 180 lb (81.647 kg)  BMI 32.91 kg/m2  SpO2 97%  LMP 03/30/2011   Physical Exam  Nursing note and vitals reviewed. Constitutional: She is oriented to person, place, and time. She appears well-developed and well-nourished.  HENT:  Head: Normocephalic and atraumatic.  Right Ear: External ear normal.  Left Ear: External ear normal.  Eyes: Conjunctivae and EOM are normal. Pupils are equal, round, and reactive to light.  Neck: Normal range of motion and phonation normal. Neck supple.  Cardiovascular: Normal rate, regular rhythm, normal heart sounds and intact distal pulses.   Pulmonary/Chest: Effort normal and breath sounds normal. She exhibits no bony tenderness.  Abdominal: Soft. Normal appearance. There is tenderness.  Bilateral lower quadrant tenderness.  Genitourinary:  Bilateral CVA tenderness. Normal external genetalia Small amount of white vaginal discharge. Mild excoriation of vaginal mucosa. Normal vaginal cuff. Bimanual exam deferred upon pain on insertion of speculum.  Musculoskeletal: Normal range of motion. She exhibits tenderness.  Bilateral  lumbar tenderness.  Neurological: She is alert and oriented to person, place, and time. She has normal strength. No cranial nerve deficit or sensory deficit. She exhibits  normal muscle tone. Coordination normal.  Skin: Skin is warm, dry and intact.  Psychiatric: She has a normal mood and affect. Her behavior is normal. Judgment and thought content normal.    ED Course   DIAGNOSTIC STUDIES: Oxygen Saturation is 97% on room air, normal by my interpretation.    Patient Vitals for the past 24 hrs:  BP Pulse Resp SpO2  12/23/12 1800 122/77 mmHg 90 23 97 %    COORDINATION OF CARE: 2:02 PM - Patient verbalizes understanding and agrees with treatment plan.  Procedures (including critical care time)  Labs Reviewed  WET PREP, GENITAL - Abnormal; Notable for the following:    Clue Cells Wet Prep HPF POC FEW (*)    WBC, Wet Prep HPF POC FEW (*)    All other components within normal limits  CBC WITH DIFFERENTIAL - Abnormal; Notable for the following:    RBC 3.77 (*)    HCT 35.6 (*)    All other components within normal limits  COMPREHENSIVE METABOLIC PANEL - Abnormal; Notable for the following:    ALT 42 (*)    Total Bilirubin 0.2 (*)    All other components within normal limits  URINALYSIS, ROUTINE W REFLEX MICROSCOPIC - Abnormal; Notable for the following:    Specific Gravity, Urine >1.030 (*)    All other components within normal limits  GC/CHLAMYDIA PROBE AMP  URINE CULTURE  RPR  HIV ANTIBODY (ROUTINE TESTING)     1. Pelvic pain     MDM  Chronic Pelvic with patient having concern for surgical mesh problem. Pt is very anxious. Doubt acute intra-abdominal or pelvic process. She is stable for d/c.    Nursing Notes Reviewed/ Care Coordinated Applicable Imaging Reviewed Interpretation of Laboratory Data incorporated into ED treatment    I personally performed the services described in this documentation, which was scribed in my presence. The recorded information has been  reviewed and is accurate.     Flint Melter, MD 12/24/12 [phone]

## 2012-12-24 LAB — GC/CHLAMYDIA PROBE AMP: CT Probe RNA: NEGATIVE

## 2012-12-24 LAB — RPR: RPR Ser Ql: NONREACTIVE

## 2012-12-25 LAB — URINE CULTURE

## 2013-03-23 ENCOUNTER — Emergency Department (HOSPITAL_COMMUNITY): Payer: Self-pay

## 2013-03-23 ENCOUNTER — Encounter (HOSPITAL_COMMUNITY): Payer: Self-pay | Admitting: Emergency Medicine

## 2013-03-23 ENCOUNTER — Emergency Department (HOSPITAL_COMMUNITY)
Admission: EM | Admit: 2013-03-23 | Discharge: 2013-03-23 | Disposition: A | Discharge status: Home / Self Care | Payer: Self-pay | Attending: Emergency Medicine | Admitting: Emergency Medicine

## 2013-03-23 ENCOUNTER — Ambulatory Visit: Payer: Self-pay | Admitting: Urology

## 2013-03-23 DIAGNOSIS — N189 Chronic kidney disease, unspecified: Secondary | ICD-10-CM | POA: Insufficient documentation

## 2013-03-23 DIAGNOSIS — J4489 Other specified chronic obstructive pulmonary disease: Secondary | ICD-10-CM | POA: Insufficient documentation

## 2013-03-23 DIAGNOSIS — G40909 Epilepsy, unspecified, not intractable, without status epilepticus: Secondary | ICD-10-CM | POA: Insufficient documentation

## 2013-03-23 DIAGNOSIS — K219 Gastro-esophageal reflux disease without esophagitis: Secondary | ICD-10-CM | POA: Insufficient documentation

## 2013-03-23 DIAGNOSIS — F329 Major depressive disorder, single episode, unspecified: Secondary | ICD-10-CM | POA: Insufficient documentation

## 2013-03-23 DIAGNOSIS — N949 Unspecified condition associated with female genital organs and menstrual cycle: Secondary | ICD-10-CM | POA: Insufficient documentation

## 2013-03-23 DIAGNOSIS — F3289 Other specified depressive episodes: Secondary | ICD-10-CM | POA: Insufficient documentation

## 2013-03-23 DIAGNOSIS — K589 Irritable bowel syndrome without diarrhea: Secondary | ICD-10-CM | POA: Insufficient documentation

## 2013-03-23 DIAGNOSIS — Z9079 Acquired absence of other genital organ(s): Secondary | ICD-10-CM | POA: Insufficient documentation

## 2013-03-23 DIAGNOSIS — Z8711 Personal history of peptic ulcer disease: Secondary | ICD-10-CM | POA: Insufficient documentation

## 2013-03-23 DIAGNOSIS — Z9089 Acquired absence of other organs: Secondary | ICD-10-CM | POA: Insufficient documentation

## 2013-03-23 DIAGNOSIS — Z90710 Acquired absence of both cervix and uterus: Secondary | ICD-10-CM | POA: Insufficient documentation

## 2013-03-23 DIAGNOSIS — F17200 Nicotine dependence, unspecified, uncomplicated: Secondary | ICD-10-CM | POA: Insufficient documentation

## 2013-03-23 DIAGNOSIS — R110 Nausea: Secondary | ICD-10-CM | POA: Insufficient documentation

## 2013-03-23 DIAGNOSIS — F411 Generalized anxiety disorder: Secondary | ICD-10-CM | POA: Insufficient documentation

## 2013-03-23 DIAGNOSIS — Z8739 Personal history of other diseases of the musculoskeletal system and connective tissue: Secondary | ICD-10-CM | POA: Insufficient documentation

## 2013-03-23 DIAGNOSIS — Z79899 Other long term (current) drug therapy: Secondary | ICD-10-CM | POA: Insufficient documentation

## 2013-03-23 DIAGNOSIS — R630 Anorexia: Secondary | ICD-10-CM | POA: Insufficient documentation

## 2013-03-23 DIAGNOSIS — Z9889 Other specified postprocedural states: Secondary | ICD-10-CM | POA: Insufficient documentation

## 2013-03-23 DIAGNOSIS — R102 Pelvic and perineal pain: Secondary | ICD-10-CM

## 2013-03-23 DIAGNOSIS — Z8619 Personal history of other infectious and parasitic diseases: Secondary | ICD-10-CM | POA: Insufficient documentation

## 2013-03-23 DIAGNOSIS — J449 Chronic obstructive pulmonary disease, unspecified: Secondary | ICD-10-CM | POA: Insufficient documentation

## 2013-03-23 DIAGNOSIS — Z87442 Personal history of urinary calculi: Secondary | ICD-10-CM | POA: Insufficient documentation

## 2013-03-23 DIAGNOSIS — I129 Hypertensive chronic kidney disease with stage 1 through stage 4 chronic kidney disease, or unspecified chronic kidney disease: Secondary | ICD-10-CM | POA: Insufficient documentation

## 2013-03-23 LAB — CBC WITH DIFFERENTIAL/PLATELET
Basophils Absolute: 0 10*3/uL (ref 0.0–0.1)
HCT: 41.9 % (ref 36.0–46.0)
Lymphocytes Relative: 34 % (ref 12–46)
Neutro Abs: 4.2 10*3/uL (ref 1.7–7.7)
Neutrophils Relative %: 58 % (ref 43–77)
Platelets: 274 10*3/uL (ref 150–400)
RDW: 12.9 % (ref 11.5–15.5)
WBC: 7.3 10*3/uL (ref 4.0–10.5)

## 2013-03-23 LAB — BASIC METABOLIC PANEL
CO2: 27 mEq/L (ref 19–32)
Chloride: 98 mEq/L (ref 96–112)
GFR calc Af Amer: 82 mL/min — ABNORMAL LOW (ref >90)
Potassium: 4 mEq/L (ref 3.5–5.1)
Sodium: 136 mEq/L (ref 135–145)

## 2013-03-23 LAB — URINALYSIS, ROUTINE W REFLEX MICROSCOPIC
Glucose, UA: NEGATIVE mg/dL
Leukocytes, UA: NEGATIVE
Nitrite: NEGATIVE
Protein, ur: NEGATIVE mg/dL

## 2013-03-23 LAB — WET PREP, GENITAL: Trich, Wet Prep: NONE SEEN

## 2013-03-23 MED ORDER — OXYCODONE-ACETAMINOPHEN 5-325 MG PO TABS: 2.0000 | ORAL_TABLET | ORAL | Status: DC | PRN

## 2013-03-23 MED ORDER — SODIUM CHLORIDE 0.9 % IV SOLN
INTRAVENOUS | Status: DC
  Administered 2013-03-23: 18:00:00 via INTRAVENOUS

## 2013-03-23 MED ORDER — KETOROLAC TROMETHAMINE 30 MG/ML IJ SOLN
30.0000 mg | Freq: Once | INTRAMUSCULAR | Status: AC
  Administered 2013-03-23: 30 mg via INTRAVENOUS
  Filled 2013-03-23: qty 1

## 2013-03-23 MED ORDER — IOHEXOL 300 MG/ML  SOLN
100.0000 mL | Freq: Once | INTRAMUSCULAR | Status: AC | PRN
  Administered 2013-03-23: 100 mL via INTRAVENOUS

## 2013-03-23 MED ORDER — IOHEXOL 300 MG/ML  SOLN
50.0000 mL | Freq: Once | INTRAMUSCULAR | Status: AC | PRN
  Administered 2013-03-23: 50 mL via ORAL

## 2013-03-23 MED ORDER — MORPHINE SULFATE 4 MG/ML IJ SOLN
4.0000 mg | Freq: Once | INTRAMUSCULAR | Status: AC
  Administered 2013-03-23: 4 mg via INTRAVENOUS
  Filled 2013-03-23: qty 1

## 2013-03-23 MED ORDER — ONDANSETRON HCL 4 MG/2ML IJ SOLN
4.0000 mg | Freq: Once | INTRAMUSCULAR | Status: AC
  Administered 2013-03-23: 4 mg via INTRAVENOUS
  Filled 2013-03-23: qty 2

## 2013-03-23 NOTE — ED Notes (Signed)
Pt c/o lower abd pain for couple of days but worse today, denies burning on urination, + nausea

## 2013-03-23 NOTE — ED Provider Notes (Signed)
CSN: [id number]     Arrival date & time 03/23/13  1724 History  This chart was scribed for Gilda Crease, * by Ronal Fear, ED Scribe. This patient was seen in room APA12/[facility]12 and the patient's care was started at 5:33 PM.    Chief Complaint  Patient presents with  . Pelvic Pain   (Consider location/radiation/quality/duration/timing/severity/associated sxs/prior Treatment) HPI  HPI Comments: Yolanda Adams is a 47 y.o. female who presents to the Emergency Department complaining of sudden onset pelvic pain that began last night and has gotten worse since this morning.  She has had a similar occurrence back in august which she was given a shot for here at [facility]. She feels nauseated and has experienced a change in appetite. She cannot specify if she has dysuria due to the constant pain in her pelvis.   Past Medical History  Diagnosis Date  . GERD (gastroesophageal reflux disease)   . PUD (peptic ulcer disease)   . Anxiety and depression   . DDD (degenerative disc disease)   . COPD (chronic obstructive pulmonary disease)   . HTN (hypertension)   . Shoulder pain   . IBS (irritable bowel syndrome)   . Peptic stricture of esophagus     dilated twice 10/2009  . Helicobacter pylori gastritis June 2011    treated with Pylera  . Shortness of breath     continuous  . Chronic kidney disease     small rt kidney stone  . Seizures     had 1 episode of it about 5 years ago, no other hx of seizures   Past Surgical History  Procedure Laterality Date  . Cholecystectomy    . Bladder surgery      x 3 in [place]-strected twice and had bladder sling placed  . Oophorectomy      Right  . Esophagogastroduodenoscopy  10/26/09 GASTRITIS/DUODENITIS    Distal esol stricture/dil 15-16 mm. A 17 mm dilator would not pass  . Upper gastrointestinal endoscopy  JUN 14, 2011DYSPHAGIA, EPIG PAIN    H PYLORI GASTRITIS  . Steroid shot in back    . Savory dilation  04/15/2011    WUJ:[id number]  gastritis/Duodenitis  . Ileocolonoscopy  04/15/2011    SLF: Colitis in the sigmoid colon/Internal hemorrhoids  . Abdominal hysterectomy     Family History  Problem Relation Age of Onset  . Prostate cancer Father     Partial gastrectomy  . Colon cancer Neg Hx   . Anesthesia problems Neg Hx   . Hypotension Neg Hx   . Malignant hyperthermia Neg Hx   . Pseudochol deficiency Neg Hx    History  Substance Use Topics  . Smoking status: Current Every Day Smoker -- 1.00 packs/day for 31 years    Types: Cigarettes  . Smokeless tobacco: Not on file  . Alcohol Use: No   OB History   Grav Para Term Preterm Abortions TAB SAB Ect Mult Living                 Review of Systems  Constitutional: Positive for appetite change. Negative for fever.  Gastrointestinal: Positive for nausea. Negative for vomiting.  Genitourinary: Positive for pelvic pain.  All other systems reviewed and are negative.    Allergies  Review of patient's allergies indicates no known allergies.  Home Medications   Current Outpatient Rx  Name  Route  Sig  Dispense  Refill  . albuterol-ipratropium (COMBIVENT) 18-103 MCG/ACT inhaler   Inhalation   Inhale 2 puffs  into the lungs every 6 (six) hours as needed for wheezing or shortness of breath.          . ALPRAZolam (XANAX) 1 MG tablet   Oral   Take 1 mg by mouth 4 (four) times daily. anxiety         . citalopram (CELEXA) 20 MG tablet   Oral   Take 20 mg by mouth at bedtime.           . dicyclomine (BENTYL) 10 MG capsule   Oral   Take 20 mg by mouth 4 (four) times daily -  before meals and at bedtime.           . gabapentin (NEURONTIN) 300 MG capsule   Oral   Take 300 mg by mouth daily.           . hydrochlorothiazide 25 MG tablet   Oral   Take 12.5 mg by mouth daily.          Marland Kitchen ibuprofen (ADVIL,MOTRIN) 200 MG tablet   Oral   Take 800 mg by mouth every 6 (six) hours as needed for pain.         Marland Kitchen omeprazole (PRILOSEC) 20 MG capsule    Oral   Take 20 mg by mouth daily.           Marland Kitchen oxyCODONE-acetaminophen (PERCOCET) 5-325 MG per tablet   Oral   Take 1 tablet by mouth every 4 (four) hours as needed for pain.   20 tablet   0   . QUEtiapine (SEROQUEL) 100 MG tablet   Oral   Take 100 mg by mouth at bedtime.          . traMADol (ULTRAM) 50 MG tablet   Oral   Take 50 mg by mouth every 6 (six) hours as needed. For pain          BP 149/100  Pulse 89  Temp(Src) 97.3 F (36.3 C) (Oral)  Ht 5' 2.5" (1.588 m)  Wt 170 lb (77.111 kg)  BMI 30.58 kg/m2  SpO2 100%  LMP 03/30/2011 Physical Exam  Nursing note and vitals reviewed. Constitutional: She is oriented to person, place, and time. She appears well-developed and well-nourished. No distress.  HENT:  Head: Normocephalic and atraumatic.  Right Ear: Hearing normal.  Left Ear: Hearing normal.  Nose: Nose normal.  Mouth/Throat: Oropharynx is clear and moist and mucous membranes are normal.  Eyes: Conjunctivae and EOM are normal. Pupils are equal, round, and reactive to light.  Neck: Normal range of motion. Neck supple.  Cardiovascular: Regular rhythm, S1 normal and S2 normal.  Exam reveals no gallop and no friction rub.   No murmur heard. Pulmonary/Chest: Effort normal and breath sounds normal. No respiratory distress. She exhibits no tenderness.  Abdominal: Soft. Normal appearance and bowel sounds are normal. There is no hepatosplenomegaly. There is tenderness (suprapubic ). There is no rebound, no guarding, no tenderness at McBurney's point and negative Murphy's sign. No hernia.  Musculoskeletal: Normal range of motion.  Neurological: She is alert and oriented to person, place, and time. She has normal strength. No cranial nerve deficit or sensory deficit. Coordination normal. GCS eye subscore is 4. GCS verbal subscore is 5. GCS motor subscore is 6.  Skin: Skin is warm, dry and intact. No rash noted. No cyanosis.  Psychiatric: She has a normal mood and affect. Her  speech is normal and behavior is normal. Thought content normal.    ED Course  Procedures (including critical  care time) DIAGNOSTIC STUDIES: Oxygen Saturation is 100% on RA, normal by my interpretation.    COORDINATION OF CARE:  5:36 PM- Pt advised of plan for treatment reviewing her previous notes to determine a course of action and pt agrees.  Labs Review Labs Reviewed  URINALYSIS, ROUTINE W REFLEX MICROSCOPIC   Imaging Review No results found.  EKG Interpretation   None       MDM  Abdominal and pelvic pain  Presents to ER with complaints of severe pain in the lower abdomen pelvis, and the suprapubic region. She has had similar presentations before. Patient has been told that these pains are secondary to her surgical mesh. Her workup was entirely unremarkable. Patient will be discharged with analgesia and is to followup with urology.  I personally performed the services described in this documentation, which was scribed in my presence. The recorded information has been reviewed and is accurate.     Gilda Crease, MD 03/23/13 [phone]

## 2013-03-23 NOTE — ED Notes (Signed)
Pt continuing to demand additional narcotics,  Reinforced with pt that no additional narcotics will be administered at this time.

## 2013-03-24 LAB — GC/CHLAMYDIA PROBE AMP
CT Probe RNA: NEGATIVE
GC Probe RNA: NEGATIVE

## 2013-05-09 ENCOUNTER — Encounter (HOSPITAL_COMMUNITY): Payer: Self-pay | Admitting: Emergency Medicine

## 2013-05-09 ENCOUNTER — Emergency Department (HOSPITAL_COMMUNITY)
Admission: EM | Admit: 2013-05-09 | Discharge: 2013-05-09 | Disposition: A | Discharge status: Home / Self Care | Payer: Self-pay | Attending: Emergency Medicine | Admitting: Emergency Medicine

## 2013-05-09 DIAGNOSIS — N189 Chronic kidney disease, unspecified: Secondary | ICD-10-CM | POA: Insufficient documentation

## 2013-05-09 DIAGNOSIS — G8928 Other chronic postprocedural pain: Secondary | ICD-10-CM | POA: Insufficient documentation

## 2013-05-09 DIAGNOSIS — Z87442 Personal history of urinary calculi: Secondary | ICD-10-CM | POA: Insufficient documentation

## 2013-05-09 DIAGNOSIS — Z9079 Acquired absence of other genital organ(s): Secondary | ICD-10-CM | POA: Insufficient documentation

## 2013-05-09 DIAGNOSIS — F17200 Nicotine dependence, unspecified, uncomplicated: Secondary | ICD-10-CM | POA: Insufficient documentation

## 2013-05-09 DIAGNOSIS — J449 Chronic obstructive pulmonary disease, unspecified: Secondary | ICD-10-CM | POA: Insufficient documentation

## 2013-05-09 DIAGNOSIS — G40909 Epilepsy, unspecified, not intractable, without status epilepticus: Secondary | ICD-10-CM | POA: Insufficient documentation

## 2013-05-09 DIAGNOSIS — Z79899 Other long term (current) drug therapy: Secondary | ICD-10-CM | POA: Insufficient documentation

## 2013-05-09 DIAGNOSIS — F411 Generalized anxiety disorder: Secondary | ICD-10-CM | POA: Insufficient documentation

## 2013-05-09 DIAGNOSIS — J4489 Other specified chronic obstructive pulmonary disease: Secondary | ICD-10-CM | POA: Insufficient documentation

## 2013-05-09 DIAGNOSIS — F329 Major depressive disorder, single episode, unspecified: Secondary | ICD-10-CM | POA: Insufficient documentation

## 2013-05-09 DIAGNOSIS — F3289 Other specified depressive episodes: Secondary | ICD-10-CM | POA: Insufficient documentation

## 2013-05-09 DIAGNOSIS — I129 Hypertensive chronic kidney disease with stage 1 through stage 4 chronic kidney disease, or unspecified chronic kidney disease: Secondary | ICD-10-CM | POA: Insufficient documentation

## 2013-05-09 DIAGNOSIS — Z8619 Personal history of other infectious and parasitic diseases: Secondary | ICD-10-CM | POA: Insufficient documentation

## 2013-05-09 DIAGNOSIS — G8918 Other acute postprocedural pain: Secondary | ICD-10-CM | POA: Insufficient documentation

## 2013-05-09 DIAGNOSIS — Z9089 Acquired absence of other organs: Secondary | ICD-10-CM | POA: Insufficient documentation

## 2013-05-09 DIAGNOSIS — G8929 Other chronic pain: Secondary | ICD-10-CM

## 2013-05-09 DIAGNOSIS — N949 Unspecified condition associated with female genital organs and menstrual cycle: Secondary | ICD-10-CM | POA: Insufficient documentation

## 2013-05-09 DIAGNOSIS — Z90710 Acquired absence of both cervix and uterus: Secondary | ICD-10-CM | POA: Insufficient documentation

## 2013-05-09 DIAGNOSIS — K219 Gastro-esophageal reflux disease without esophagitis: Secondary | ICD-10-CM | POA: Insufficient documentation

## 2013-05-09 DIAGNOSIS — IMO0002 Reserved for concepts with insufficient information to code with codable children: Secondary | ICD-10-CM | POA: Insufficient documentation

## 2013-05-09 DIAGNOSIS — R102 Pelvic and perineal pain: Secondary | ICD-10-CM

## 2013-05-09 DIAGNOSIS — Z8711 Personal history of peptic ulcer disease: Secondary | ICD-10-CM | POA: Insufficient documentation

## 2013-05-09 LAB — URINALYSIS, ROUTINE W REFLEX MICROSCOPIC
Bilirubin Urine: NEGATIVE
Glucose, UA: NEGATIVE mg/dL
KETONES UR: NEGATIVE mg/dL
LEUKOCYTES UA: NEGATIVE
NITRITE: NEGATIVE
PH: 5.5 (ref 5.0–8.0)
Protein, ur: NEGATIVE mg/dL
SPECIFIC GRAVITY, URINE: 1.01 (ref 1.005–1.030)
Urobilinogen, UA: 0.2 mg/dL (ref 0.0–1.0)

## 2013-05-09 LAB — URINE MICROSCOPIC-ADD ON

## 2013-05-09 MED ORDER — HYDROMORPHONE HCL PF 1 MG/ML IJ SOLN
0.5000 mg | Freq: Once | INTRAMUSCULAR | Status: AC
  Administered 2013-05-09: 0.5 mg via INTRAMUSCULAR
  Filled 2013-05-09: qty 1

## 2013-05-09 MED ORDER — OXYCODONE-ACETAMINOPHEN 5-325 MG PO TABS: 1.0000 | ORAL_TABLET | Freq: Four times a day (QID) | ORAL | Status: DC | PRN

## 2013-05-09 NOTE — ED Provider Notes (Signed)
CSN: [id number]     Arrival date & time 05/09/13  1344 History   First MD Initiated Contact with Patient 05/09/13 1547     Chief Complaint  Patient presents with  . Abdominal Pain   (Consider location/radiation/quality/duration/timing/severity/associated sxs/prior Treatment) Patient is a 48 y.o. female presenting with abdominal pain. The history is provided by the patient.  Abdominal Pain Associated symptoms: no chest pain, no diarrhea, no nausea, no shortness of breath, no vaginal bleeding and no vomiting    patient with acute on chronic pelvic pain. States it is due to a mesh bladder sling that she had put in years ago. She states that urologist will not take it out. She states that her lawyer states she may have to go to [place]. She states that the doctor there put it in has been run out of [facility] hospital because he hurt too many women. She states it hurts all the time of the health department will give her Lyrica. She states she has to come in the ED and get Dilaudid and 5 mg Percocets. No dysuria. No vaginal bleeding or discharge. This is the typical pain that she has with this.  Past Medical History  Diagnosis Date  . GERD (gastroesophageal reflux disease)   . PUD (peptic ulcer disease)   . Anxiety and depression   . DDD (degenerative disc disease)   . COPD (chronic obstructive pulmonary disease)   . HTN (hypertension)   . Shoulder pain   . IBS (irritable bowel syndrome)   . Peptic stricture of esophagus     dilated twice 10/2009  . Helicobacter pylori gastritis June 2011    treated with Pylera  . Shortness of breath     continuous  . Chronic kidney disease     small rt kidney stone  . Seizures     had 1 episode of it about 5 years ago, no other hx of seizures   Past Surgical History  Procedure Laterality Date  . Cholecystectomy    . Bladder surgery      x 3 in [place]-strected twice and had bladder sling placed  . Oophorectomy      Right  . Esophagogastroduodenoscopy   10/26/09 GASTRITIS/DUODENITIS    Distal esol stricture/dil 15-16 mm. A 17 mm dilator would not pass  . Upper gastrointestinal endoscopy  JUN 14, 2011DYSPHAGIA, EPIG PAIN    H PYLORI GASTRITIS  . Steroid shot in back    . Savory dilation  04/15/2011    WUJ:[id number] gastritis/Duodenitis  . Ileocolonoscopy  04/15/2011    SLF: Colitis in the sigmoid colon/Internal hemorrhoids  . Abdominal hysterectomy     Family History  Problem Relation Age of Onset  . Prostate cancer Father     Partial gastrectomy  . Colon cancer Neg Hx   . Anesthesia problems Neg Hx   . Hypotension Neg Hx   . Malignant hyperthermia Neg Hx   . Pseudochol deficiency Neg Hx    History  Substance Use Topics  . Smoking status: Current Every Day Smoker -- 1.00 packs/day for 31 years    Types: Cigarettes  . Smokeless tobacco: Not on file  . Alcohol Use: No   OB History   Grav Para Term Preterm Abortions TAB SAB Ect Mult Living                 Review of Systems  Constitutional: Negative for activity change and appetite change.  Eyes: Negative for pain.  Respiratory: Negative for chest tightness and  shortness of breath.   Cardiovascular: Negative for chest pain and leg swelling.  Gastrointestinal: Positive for abdominal pain. Negative for nausea, vomiting and diarrhea.  Genitourinary: Positive for pelvic pain. Negative for flank pain and vaginal bleeding.  Musculoskeletal: Negative for back pain and neck stiffness.  Skin: Negative for rash.  Neurological: Negative for weakness, numbness and headaches.  Psychiatric/Behavioral: Negative for behavioral problems.    Allergies  Review of patient's allergies indicates no known allergies.  Home Medications   Current Outpatient Rx  Name  Route  Sig  Dispense  Refill  . albuterol-ipratropium (COMBIVENT) 18-103 MCG/ACT inhaler   Inhalation   Inhale 2 puffs into the lungs every 6 (six) hours as needed for wheezing or shortness of breath.          . ALPRAZolam  (XANAX) 1 MG tablet   Oral   Take 1 mg by mouth 4 (four) times daily. anxiety         . Aspirin-Acetaminophen-Caffeine (GOODY HEADACHE PO)   Oral   Take 2 packets by mouth daily as needed (for pain).         Marland Kitchen gabapentin (NEURONTIN) 300 MG capsule   Oral   Take 300 mg by mouth daily.           . hydrochlorothiazide 25 MG tablet   Oral   Take 12.5 mg by mouth daily.          Marland Kitchen ibuprofen (ADVIL,MOTRIN) 200 MG tablet   Oral   Take 800 mg by mouth every 6 (six) hours as needed for pain.         Marland Kitchen omeprazole (PRILOSEC) 20 MG capsule   Oral   Take 20 mg by mouth daily.           Marland Kitchen oxyCODONE-acetaminophen (PERCOCET/ROXICET) 5-325 MG per tablet   Oral   Take 1-2 tablets by mouth every 6 (six) hours as needed for severe pain.   10 tablet   0    BP 102/85  Pulse 79  Temp(Src) 97.6 F (36.4 C) (Oral)  Resp 22  Ht 5\' 2"  (1.575 m)  Wt 170 lb (77.111 kg)  BMI 31.09 kg/m2  SpO2 96%  LMP 03/30/2011 Physical Exam  Nursing note and vitals reviewed. Constitutional: She is oriented to person, place, and time. She appears well-developed and well-nourished.  Patient appears uncomfortable  HENT:  Head: Normocephalic and atraumatic.  Eyes: EOM are normal. Pupils are equal, round, and reactive to light.  Neck: Normal range of motion. Neck supple.  Cardiovascular: Normal rate, regular rhythm and normal heart sounds.   No murmur heard. Pulmonary/Chest: Effort normal and breath sounds normal. No respiratory distress. She has no wheezes. She has no rales.  Abdominal: Soft. Bowel sounds are normal. She exhibits no distension. There is tenderness. There is no rebound and no guarding.  Lower abdominal tenderness without rebound or guarding.  Musculoskeletal: Normal range of motion.  Neurological: She is alert and oriented to person, place, and time. No cranial nerve deficit.  Skin: Skin is warm and dry.  Psychiatric: She has a normal mood and affect. Her speech is normal.     ED Course  Procedures (including critical care time) Labs Review Labs Reviewed  URINALYSIS, ROUTINE W REFLEX MICROSCOPIC - Abnormal; Notable for the following:    Hgb urine dipstick TRACE (*)    All other components within normal limits  URINE MICROSCOPIC-ADD ON   Imaging Review No results found.  EKG Interpretation   None  MDM   1. Chronic pelvic pain in female    Patient with acute on chronic pelvic pain. Patient states is from mesh. She states she'll follow with her urologist. She was given a dose of Dilaudid and 5 mg Percocets. She was instructed that the ED is not the place for management of chronic pain    Harrold DonathNathan R. Rubin PayorPickering, MD 05/09/13 1859

## 2013-05-09 NOTE — ED Notes (Signed)
Pt states ongoing "bladder pain" since last visit. States pain if from mesh sling.

## 2013-05-09 NOTE — Discharge Instructions (Signed)

## 2013-07-07 ENCOUNTER — Encounter (HOSPITAL_COMMUNITY): Payer: Self-pay | Admitting: Emergency Medicine

## 2013-07-07 ENCOUNTER — Emergency Department (HOSPITAL_COMMUNITY)
Admission: EM | Admit: 2013-07-07 | Discharge: 2013-07-07 | Disposition: A | Discharge status: Home / Self Care | Payer: Self-pay | Attending: Emergency Medicine | Admitting: Emergency Medicine

## 2013-07-07 DIAGNOSIS — F17200 Nicotine dependence, unspecified, uncomplicated: Secondary | ICD-10-CM | POA: Insufficient documentation

## 2013-07-07 DIAGNOSIS — J4489 Other specified chronic obstructive pulmonary disease: Secondary | ICD-10-CM | POA: Insufficient documentation

## 2013-07-07 DIAGNOSIS — G8929 Other chronic pain: Secondary | ICD-10-CM | POA: Insufficient documentation

## 2013-07-07 DIAGNOSIS — R109 Unspecified abdominal pain: Secondary | ICD-10-CM

## 2013-07-07 DIAGNOSIS — G40909 Epilepsy, unspecified, not intractable, without status epilepticus: Secondary | ICD-10-CM | POA: Insufficient documentation

## 2013-07-07 DIAGNOSIS — F329 Major depressive disorder, single episode, unspecified: Secondary | ICD-10-CM | POA: Insufficient documentation

## 2013-07-07 DIAGNOSIS — Z8711 Personal history of peptic ulcer disease: Secondary | ICD-10-CM | POA: Insufficient documentation

## 2013-07-07 DIAGNOSIS — Z8619 Personal history of other infectious and parasitic diseases: Secondary | ICD-10-CM | POA: Insufficient documentation

## 2013-07-07 DIAGNOSIS — N189 Chronic kidney disease, unspecified: Secondary | ICD-10-CM | POA: Insufficient documentation

## 2013-07-07 DIAGNOSIS — Z87442 Personal history of urinary calculi: Secondary | ICD-10-CM | POA: Insufficient documentation

## 2013-07-07 DIAGNOSIS — K219 Gastro-esophageal reflux disease without esophagitis: Secondary | ICD-10-CM | POA: Insufficient documentation

## 2013-07-07 DIAGNOSIS — F3289 Other specified depressive episodes: Secondary | ICD-10-CM | POA: Insufficient documentation

## 2013-07-07 DIAGNOSIS — J449 Chronic obstructive pulmonary disease, unspecified: Secondary | ICD-10-CM | POA: Insufficient documentation

## 2013-07-07 DIAGNOSIS — I129 Hypertensive chronic kidney disease with stage 1 through stage 4 chronic kidney disease, or unspecified chronic kidney disease: Secondary | ICD-10-CM | POA: Insufficient documentation

## 2013-07-07 DIAGNOSIS — Z90710 Acquired absence of both cervix and uterus: Secondary | ICD-10-CM | POA: Insufficient documentation

## 2013-07-07 DIAGNOSIS — F411 Generalized anxiety disorder: Secondary | ICD-10-CM | POA: Insufficient documentation

## 2013-07-07 DIAGNOSIS — R1031 Right lower quadrant pain: Secondary | ICD-10-CM | POA: Insufficient documentation

## 2013-07-07 DIAGNOSIS — Z79899 Other long term (current) drug therapy: Secondary | ICD-10-CM | POA: Insufficient documentation

## 2013-07-07 DIAGNOSIS — Z9089 Acquired absence of other organs: Secondary | ICD-10-CM | POA: Insufficient documentation

## 2013-07-07 LAB — CBC WITH DIFFERENTIAL/PLATELET
BASOS ABS: 0 10*3/uL (ref 0.0–0.1)
Basophils Relative: 1 % (ref 0–1)
EOS ABS: 0.2 10*3/uL (ref 0.0–0.7)
EOS PCT: 2 % (ref 0–5)
HCT: 40 % (ref 36.0–46.0)
Hemoglobin: 13.9 g/dL (ref 12.0–15.0)
LYMPHS PCT: 30 % (ref 12–46)
Lymphs Abs: 2.4 10*3/uL (ref 0.7–4.0)
MCH: 32.6 pg (ref 26.0–34.0)
MCHC: 34.8 g/dL (ref 30.0–36.0)
MCV: 93.7 fL (ref 78.0–100.0)
Monocytes Absolute: 0.4 10*3/uL (ref 0.1–1.0)
Monocytes Relative: 5 % (ref 3–12)
Neutro Abs: 5 10*3/uL (ref 1.7–7.7)
Neutrophils Relative %: 63 % (ref 43–77)
PLATELETS: 283 10*3/uL (ref 150–400)
RBC: 4.27 MIL/uL (ref 3.87–5.11)
RDW: 12.6 % (ref 11.5–15.5)
WBC: 7.9 10*3/uL (ref 4.0–10.5)

## 2013-07-07 LAB — COMPREHENSIVE METABOLIC PANEL
ALT: 12 U/L (ref 0–35)
AST: 16 U/L (ref 0–37)
Albumin: 3.8 g/dL (ref 3.5–5.2)
Alkaline Phosphatase: 123 U/L — ABNORMAL HIGH (ref 39–117)
BUN: 9 mg/dL (ref 6–23)
CALCIUM: 8.9 mg/dL (ref 8.4–10.5)
CO2: 24 meq/L (ref 19–32)
Chloride: 93 mEq/L — ABNORMAL LOW (ref 96–112)
Creatinine, Ser: 1.32 mg/dL — ABNORMAL HIGH (ref 0.50–1.10)
GFR calc Af Amer: 53 mL/min — ABNORMAL LOW (ref >90)
GFR calc non Af Amer: 46 mL/min — ABNORMAL LOW (ref >90)
Glucose, Bld: 119 mg/dL — ABNORMAL HIGH (ref 70–99)
Potassium: 3.4 mEq/L — ABNORMAL LOW (ref 3.7–5.3)
SODIUM: 128 meq/L — AB (ref 137–147)
TOTAL PROTEIN: 7.1 g/dL (ref 6.0–8.3)
Total Bilirubin: 0.4 mg/dL (ref 0.3–1.2)

## 2013-07-07 MED ORDER — OXYCODONE-ACETAMINOPHEN 5-325 MG PO TABS: 1.0000 | ORAL_TABLET | Freq: Four times a day (QID) | ORAL | Status: DC | PRN

## 2013-07-07 MED ORDER — HYDROMORPHONE HCL PF 2 MG/ML IJ SOLN
2.0000 mg | Freq: Once | INTRAMUSCULAR | Status: AC
  Administered 2013-07-07: 2 mg via INTRAMUSCULAR
  Filled 2013-07-07: qty 1

## 2013-07-07 MED ORDER — HYDROMORPHONE HCL PF 1 MG/ML IJ SOLN
1.0000 mg | Freq: Once | INTRAMUSCULAR | Status: AC
  Administered 2013-07-07: 1 mg via INTRAMUSCULAR
  Filled 2013-07-07: qty 1

## 2013-07-07 NOTE — ED Provider Notes (Signed)
CSN: [id number]     Arrival date & time 07/07/13  1627 History  This chart was scribed for Benny LennertJoseph L Camylle Whicker, MD by Danella Maiersaroline Early, ED Scribe. This patient was seen in room APA01/APA01 and the patient's care was started at 6:36 PM.    Chief Complaint  Patient presents with  . Abdominal Pain   Patient is a 48 y.o. female presenting with abdominal pain. The history is provided by the patient. No language interpreter was used.  Abdominal Pain Pain location:  LLQ, RLQ and suprapubic Pain radiates to:  Does not radiate Timing:  Constant Progression:  Waxing and waning Chronicity:  Chronic Associated symptoms: no chest pain, no cough, no diarrhea, no fatigue and no hematuria    HPI Comments: Yolanda Adams is a 48 y.o. female who presents to the Emergency Department complaining of waxing and waning abdominal pain due to a mesh bladder sling. She states this pain is recurrent and nothing unusual for her. She has an appointment to have the sling removed by Dr Annabell HowellsWrenn. She states she does not take OTC pain meds at home but at the hospital dilaudid shots work for her. She denies nausea, vomiting.   Past Medical History  Diagnosis Date  . GERD (gastroesophageal reflux disease)   . PUD (peptic ulcer disease)   . Anxiety and depression   . DDD (degenerative disc disease)   . COPD (chronic obstructive pulmonary disease)   . HTN (hypertension)   . Shoulder pain   . IBS (irritable bowel syndrome)   . Peptic stricture of esophagus     dilated twice 10/2009  . Helicobacter pylori gastritis June 2011    treated with Pylera  . Shortness of breath     continuous  . Chronic kidney disease     small rt kidney stone  . Seizures     had 1 episode of it about 5 years ago, no other hx of seizures   Past Surgical History  Procedure Laterality Date  . Cholecystectomy    . Bladder surgery      x 3 in [place]-strected twice and had bladder sling placed  . Oophorectomy      Right  . Esophagogastroduodenoscopy   10/26/09 GASTRITIS/DUODENITIS    Distal esol stricture/dil 15-16 mm. A 17 mm dilator would not pass  . Upper gastrointestinal endoscopy  JUN 14, 2011DYSPHAGIA, EPIG PAIN    H PYLORI GASTRITIS  . Steroid shot in back    . Savory dilation  04/15/2011    WUJ:[id number]:Moderate gastritis/Duodenitis  . Ileocolonoscopy  04/15/2011    SLF: Colitis in the sigmoid colon/Internal hemorrhoids  . Abdominal hysterectomy     Family History  Problem Relation Age of Onset  . Prostate cancer Father     Partial gastrectomy  . Colon cancer Neg Hx   . Anesthesia problems Neg Hx   . Hypotension Neg Hx   . Malignant hyperthermia Neg Hx   . Pseudochol deficiency Neg Hx    History  Substance Use Topics  . Smoking status: Current Every Day Smoker -- 1.00 packs/day for 31 years    Types: Cigarettes  . Smokeless tobacco: Not on file  . Alcohol Use: No   OB History   Grav Para Term Preterm Abortions TAB SAB Ect Mult Living                 Review of Systems  Constitutional: Negative for appetite change and fatigue.  HENT: Negative for congestion, ear discharge and sinus pressure.  Eyes: Negative for discharge.  Respiratory: Negative for cough.   Cardiovascular: Negative for chest pain.  Gastrointestinal: Positive for abdominal pain. Negative for diarrhea.  Genitourinary: Negative for frequency and hematuria.  Musculoskeletal: Negative for back pain.  Skin: Negative for rash.  Neurological: Negative for seizures and headaches.  Psychiatric/Behavioral: Negative for hallucinations.      Allergies  Review of patient's allergies indicates no known allergies.  Home Medications   Current Outpatient Rx  Name  Route  Sig  Dispense  Refill  . albuterol-ipratropium (COMBIVENT) 18-103 MCG/ACT inhaler   Inhalation   Inhale 2 puffs into the lungs every 6 (six) hours as needed for wheezing or shortness of breath.          . ALPRAZolam (XANAX) 1 MG tablet   Oral   Take 1 mg by mouth 4 (four) times daily.  anxiety         . Aspirin-Acetaminophen-Caffeine (GOODY HEADACHE PO)   Oral   Take 2 packets by mouth daily as needed (for pain).         Marland Kitchen gabapentin (NEURONTIN) 300 MG capsule   Oral   Take 300 mg by mouth daily.           . hydrochlorothiazide 25 MG tablet   Oral   Take 12.5 mg by mouth daily.          Marland Kitchen ibuprofen (ADVIL,MOTRIN) 200 MG tablet   Oral   Take 800 mg by mouth every 6 (six) hours as needed for pain.         Marland Kitchen omeprazole (PRILOSEC) 20 MG capsule   Oral   Take 20 mg by mouth daily.           Marland Kitchen oxyCODONE-acetaminophen (PERCOCET/ROXICET) 5-325 MG per tablet   Oral   Take 1-2 tablets by mouth every 6 (six) hours as needed for severe pain.   10 tablet   0    BP 129/82  Pulse 80  Temp(Src) 98.2 F (36.8 C) (Oral)  Resp 18  Ht 5\' 2"  (1.575 m)  Wt 160 lb (72.576 kg)  BMI 29.26 kg/m2  SpO2 95%  LMP 03/30/2011 Physical Exam  Constitutional: She is oriented to person, place, and time. She appears well-developed.  HENT:  Head: Normocephalic.  Eyes: Conjunctivae and EOM are normal. No scleral icterus.  Neck: Neck supple. No thyromegaly present.  Cardiovascular: Normal rate and regular rhythm.  Exam reveals no gallop and no friction rub.   No murmur heard. Pulmonary/Chest: No stridor. She has no wheezes. She has no rales. She exhibits no tenderness.  Abdominal: She exhibits no distension. There is tenderness (moderate, suprapubic, RLQ, and RLQ). There is no rebound.  Musculoskeletal: Normal range of motion. She exhibits no edema.  Lymphadenopathy:    She has no cervical adenopathy.  Neurological: She is oriented to person, place, and time. She exhibits normal muscle tone. Coordination normal.  Skin: No rash noted. No erythema.  Psychiatric: She has a normal mood and affect. Her behavior is normal.    ED Course  Procedures (including critical care time) Medications  HYDROmorphone (DILAUDID) injection 1 mg (not administered)   DIAGNOSTIC  STUDIES: Oxygen Saturation is 95% on RA, normal by my interpretation.    COORDINATION OF CARE: 6:52 PM- Discussed treatment plan with pt. Pt agrees to plan.    Labs Review Labs Reviewed  CBC WITH DIFFERENTIAL  COMPREHENSIVE METABOLIC PANEL   Imaging Review No results found.   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  Pt will follow up with pcp The chart was scribed for me under my direct supervision.  I personally performed the history, physical, and medical decision making and all procedures in the evaluation of this patient.Benny Lennert, MD 07/07/13 2034

## 2013-07-07 NOTE — Discharge Instructions (Signed)
Follow up with your family md if you cannot see the urologist

## 2013-07-07 NOTE — ED Notes (Signed)
Pt alert & oriented x4, stable gait. Patient given discharge instructions, paperwork & prescription(s). Patient  instructed to stop at the registration desk to finish any additional paperwork. Patient verbalized understanding. Pt left department w/ no further questions. 

## 2013-07-07 NOTE — ED Notes (Signed)
States she is having the usual abdominal pain from a bladder sling. States she usually gets a shot for pain, states she is scheduled for surgery next month

## 2013-07-28 ENCOUNTER — Encounter (HOSPITAL_COMMUNITY): Payer: Self-pay | Admitting: Emergency Medicine

## 2013-07-28 ENCOUNTER — Emergency Department (HOSPITAL_COMMUNITY)
Admission: EM | Admit: 2013-07-28 | Discharge: 2013-07-28 | Disposition: A | Discharge status: Home / Self Care | Payer: Self-pay | Attending: Emergency Medicine | Admitting: Emergency Medicine

## 2013-07-28 DIAGNOSIS — IMO0002 Reserved for concepts with insufficient information to code with codable children: Secondary | ICD-10-CM | POA: Insufficient documentation

## 2013-07-28 DIAGNOSIS — Z79899 Other long term (current) drug therapy: Secondary | ICD-10-CM | POA: Insufficient documentation

## 2013-07-28 DIAGNOSIS — Z8711 Personal history of peptic ulcer disease: Secondary | ICD-10-CM | POA: Insufficient documentation

## 2013-07-28 DIAGNOSIS — J449 Chronic obstructive pulmonary disease, unspecified: Secondary | ICD-10-CM | POA: Insufficient documentation

## 2013-07-28 DIAGNOSIS — Z9079 Acquired absence of other genital organ(s): Secondary | ICD-10-CM | POA: Insufficient documentation

## 2013-07-28 DIAGNOSIS — Z9089 Acquired absence of other organs: Secondary | ICD-10-CM | POA: Insufficient documentation

## 2013-07-28 DIAGNOSIS — Z90710 Acquired absence of both cervix and uterus: Secondary | ICD-10-CM | POA: Insufficient documentation

## 2013-07-28 DIAGNOSIS — G8929 Other chronic pain: Secondary | ICD-10-CM | POA: Insufficient documentation

## 2013-07-28 DIAGNOSIS — Z791 Long term (current) use of non-steroidal anti-inflammatories (NSAID): Secondary | ICD-10-CM | POA: Insufficient documentation

## 2013-07-28 DIAGNOSIS — F3289 Other specified depressive episodes: Secondary | ICD-10-CM | POA: Insufficient documentation

## 2013-07-28 DIAGNOSIS — Z9889 Other specified postprocedural states: Secondary | ICD-10-CM | POA: Insufficient documentation

## 2013-07-28 DIAGNOSIS — N189 Chronic kidney disease, unspecified: Secondary | ICD-10-CM | POA: Insufficient documentation

## 2013-07-28 DIAGNOSIS — R109 Unspecified abdominal pain: Secondary | ICD-10-CM | POA: Insufficient documentation

## 2013-07-28 DIAGNOSIS — F17200 Nicotine dependence, unspecified, uncomplicated: Secondary | ICD-10-CM | POA: Insufficient documentation

## 2013-07-28 DIAGNOSIS — F411 Generalized anxiety disorder: Secondary | ICD-10-CM | POA: Insufficient documentation

## 2013-07-28 DIAGNOSIS — G40909 Epilepsy, unspecified, not intractable, without status epilepticus: Secondary | ICD-10-CM | POA: Insufficient documentation

## 2013-07-28 DIAGNOSIS — Z8619 Personal history of other infectious and parasitic diseases: Secondary | ICD-10-CM | POA: Insufficient documentation

## 2013-07-28 DIAGNOSIS — I129 Hypertensive chronic kidney disease with stage 1 through stage 4 chronic kidney disease, or unspecified chronic kidney disease: Secondary | ICD-10-CM | POA: Insufficient documentation

## 2013-07-28 DIAGNOSIS — F329 Major depressive disorder, single episode, unspecified: Secondary | ICD-10-CM | POA: Insufficient documentation

## 2013-07-28 DIAGNOSIS — K219 Gastro-esophageal reflux disease without esophagitis: Secondary | ICD-10-CM | POA: Insufficient documentation

## 2013-07-28 DIAGNOSIS — J4489 Other specified chronic obstructive pulmonary disease: Secondary | ICD-10-CM | POA: Insufficient documentation

## 2013-07-28 DIAGNOSIS — I10 Essential (primary) hypertension: Secondary | ICD-10-CM | POA: Insufficient documentation

## 2013-07-28 LAB — CBC WITH DIFFERENTIAL/PLATELET
Basophils Absolute: 0 10*3/uL (ref 0.0–0.1)
Basophils Relative: 1 % (ref 0–1)
EOS ABS: 0.1 10*3/uL (ref 0.0–0.7)
EOS PCT: 2 % (ref 0–5)
HCT: 41.3 % (ref 36.0–46.0)
Hemoglobin: 13.9 g/dL (ref 12.0–15.0)
LYMPHS ABS: 1.8 10*3/uL (ref 0.7–4.0)
Lymphocytes Relative: 29 % (ref 12–46)
MCH: 32.1 pg (ref 26.0–34.0)
MCHC: 33.7 g/dL (ref 30.0–36.0)
MCV: 95.4 fL (ref 78.0–100.0)
Monocytes Absolute: 0.3 10*3/uL (ref 0.1–1.0)
Monocytes Relative: 5 % (ref 3–12)
Neutro Abs: 4 10*3/uL (ref 1.7–7.7)
Neutrophils Relative %: 64 % (ref 43–77)
Platelets: 275 10*3/uL (ref 150–400)
RBC: 4.33 MIL/uL (ref 3.87–5.11)
RDW: 12.4 % (ref 11.5–15.5)
WBC: 6.1 10*3/uL (ref 4.0–10.5)

## 2013-07-28 LAB — COMPREHENSIVE METABOLIC PANEL
ALT: 12 U/L (ref 0–35)
AST: 13 U/L (ref 0–37)
Albumin: 3.8 g/dL (ref 3.5–5.2)
Alkaline Phosphatase: 118 U/L — ABNORMAL HIGH (ref 39–117)
BUN: 8 mg/dL (ref 6–23)
CALCIUM: 9.5 mg/dL (ref 8.4–10.5)
CO2: 26 meq/L (ref 19–32)
CREATININE: 0.75 mg/dL (ref 0.50–1.10)
Chloride: 106 mEq/L (ref 96–112)
GLUCOSE: 102 mg/dL — AB (ref 70–99)
Potassium: 4 mEq/L (ref 3.7–5.3)
Sodium: 143 mEq/L (ref 137–147)
Total Bilirubin: 0.2 mg/dL — ABNORMAL LOW (ref 0.3–1.2)
Total Protein: 7.6 g/dL (ref 6.0–8.3)

## 2013-07-28 LAB — URINALYSIS, ROUTINE W REFLEX MICROSCOPIC
Bilirubin Urine: NEGATIVE
Glucose, UA: NEGATIVE mg/dL
Hgb urine dipstick: NEGATIVE
Ketones, ur: NEGATIVE mg/dL
LEUKOCYTES UA: NEGATIVE
NITRITE: NEGATIVE
Protein, ur: NEGATIVE mg/dL
SPECIFIC GRAVITY, URINE: 1.01 (ref 1.005–1.030)
UROBILINOGEN UA: 0.2 mg/dL (ref 0.0–1.0)
pH: 6 (ref 5.0–8.0)

## 2013-07-28 MED ORDER — KETOROLAC TROMETHAMINE 30 MG/ML IJ SOLN
30.0000 mg | Freq: Once | INTRAMUSCULAR | Status: AC
  Administered 2013-07-28: 30 mg via INTRAVENOUS
  Filled 2013-07-28: qty 1

## 2013-07-28 MED ORDER — MORPHINE SULFATE 4 MG/ML IJ SOLN
4.0000 mg | Freq: Once | INTRAMUSCULAR | Status: AC
  Administered 2013-07-28: 4 mg via INTRAVENOUS
  Filled 2013-07-28: qty 1

## 2013-07-28 MED ORDER — ONDANSETRON HCL 4 MG PO TABS
4.0000 mg | ORAL_TABLET | Freq: Once | ORAL | Status: AC
  Administered 2013-07-28: 4 mg via ORAL
  Filled 2013-07-28: qty 1

## 2013-07-28 MED ORDER — HYDROCODONE-ACETAMINOPHEN 5-325 MG PO TABS: 1.0000 | ORAL_TABLET | ORAL | Status: DC | PRN

## 2013-07-28 NOTE — ED Provider Notes (Signed)
CSN: [id number]     Arrival date & time 07/28/13  1248 History   First MD Initiated Contact with Patient 07/28/13 1328  This chart was scribed for Yolanda Batonourtney F Patty Leitzke, MD by Valera CastleSteven Perry, ED Scribe. This patient was seen in room APA01/APA01 and the patient's care was started at 1:32 PM.    Chief Complaint  Patient presents with  . Abdominal Pain   (Consider location/radiation/quality/duration/timing/severity/associated sxs/prior Treatment) The history is provided by the patient. No language interpreter was used.   HPI Comments: Yolanda GoodpastureVicky G Adams is a 48 y.o. female who presents to the Emergency Department complaining of 10/10, waxing and waning, cramping, lower abdominal pain, onset since she had a mesh bladder sling placed. She reports she is going to have multiple surgeries to remove the mesh. She reports taking Ibuprofen for the pain, without relief. She denies urinary symptoms, and any other associated symptoms.   It has been her multiple times and states that she primarily uses the emergency department when her pain "gets too bad for me to handle."  PCP - No PCP Per Patient  Past Medical History  Diagnosis Date  . GERD (gastroesophageal reflux disease)   . PUD (peptic ulcer disease)   . Anxiety and depression   . DDD (degenerative disc disease)   . COPD (chronic obstructive pulmonary disease)   . HTN (hypertension)   . Shoulder pain   . IBS (irritable bowel syndrome)   . Peptic stricture of esophagus     dilated twice 10/2009  . Helicobacter pylori gastritis June 2011    treated with Pylera  . Shortness of breath     continuous  . Chronic kidney disease     small rt kidney stone  . Seizures     had 1 episode of it about 5 years ago, no other hx of seizures   Past Surgical History  Procedure Laterality Date  . Cholecystectomy    . Bladder surgery      x 3 in [place]-strected twice and had bladder sling placed  . Oophorectomy      Right  . Esophagogastroduodenoscopy  10/26/09  GASTRITIS/DUODENITIS    Distal esol stricture/dil 15-16 mm. A 17 mm dilator would not pass  . Upper gastrointestinal endoscopy  JUN 14, 2011DYSPHAGIA, EPIG PAIN    H PYLORI GASTRITIS  . Steroid shot in back    . Savory dilation  04/15/2011    QIO:[id number]:Moderate gastritis/Duodenitis  . Ileocolonoscopy  04/15/2011    SLF: Colitis in the sigmoid colon/Internal hemorrhoids  . Abdominal hysterectomy     Family History  Problem Relation Age of Onset  . Prostate cancer Father     Partial gastrectomy  . Colon cancer Neg Hx   . Anesthesia problems Neg Hx   . Hypotension Neg Hx   . Malignant hyperthermia Neg Hx   . Pseudochol deficiency Neg Hx    History  Substance Use Topics  . Smoking status: Current Every Day Smoker -- 1.00 packs/day for 31 years    Types: Cigarettes  . Smokeless tobacco: Not on file  . Alcohol Use: No   OB History   Grav Para Term Preterm Abortions TAB SAB Ect Mult Living                 Review of Systems  Constitutional: Negative for fever.  Respiratory: Negative for chest tightness and shortness of breath.   Cardiovascular: Negative for chest pain.  Gastrointestinal: Positive for abdominal pain. Negative for nausea, vomiting and diarrhea.  Genitourinary: Negative for dysuria.  Neurological: Negative for headaches.  All other systems reviewed and are negative.      Allergies  Toradol  Home Medications   Current Outpatient Rx  Name  Route  Sig  Dispense  Refill  . albuterol (PROVENTIL HFA;VENTOLIN HFA) 108 (90 BASE) MCG/ACT inhaler   Inhalation   Inhale 2 puffs into the lungs every 6 (six) hours as needed for wheezing or shortness of breath.         . citalopram (CELEXA) 40 MG tablet   Oral   Take 40 mg by mouth daily.         . diazepam (VALIUM) 10 MG tablet   Oral   Take 10 mg by mouth 3 (three) times daily.         Marland Kitchen gabapentin (NEURONTIN) 300 MG capsule   Oral   Take 300 mg by mouth daily.           . hydrochlorothiazide 25 MG  tablet   Oral   Take 12.5 mg by mouth daily.          Marland Kitchen ibuprofen (ADVIL,MOTRIN) 200 MG tablet   Oral   Take 800 mg by mouth daily.         Marland Kitchen omeprazole (PRILOSEC) 20 MG capsule   Oral   Take 20 mg by mouth daily.           . traZODone (DESYREL) 100 MG tablet   Oral   Take 100 mg by mouth at bedtime.         Marland Kitchen HYDROcodone-acetaminophen (NORCO/VICODIN) 5-325 MG per tablet   Oral   Take 1 tablet by mouth every 4 (four) hours as needed.   10 tablet   0    BP 147/84  Pulse 73  Temp(Src) 97.4 F (36.3 C) (Oral)  Resp 18  Ht 5\' 2"  (1.575 m)  Wt 171 lb (77.565 kg)  BMI 31.27 kg/m2  SpO2 99%  LMP 03/30/2011  Physical Exam  Nursing note and vitals reviewed. Constitutional: She is oriented to person, place, and time. She appears well-developed and well-nourished. No distress.  HENT:  Head: Normocephalic and atraumatic.  Cardiovascular: Normal rate, regular rhythm and normal heart sounds.   No murmur heard. Pulmonary/Chest: Effort normal and breath sounds normal. No respiratory distress. She has no wheezes.  Abdominal: Soft. Bowel sounds are normal. There is no tenderness. There is no rebound and no guarding.  Musculoskeletal: She exhibits no edema.  Neurological: She is alert and oriented to person, place, and time.  Skin: Skin is warm and dry.  Psychiatric: She has a normal mood and affect.    ED Course  Procedures (including critical care time)  DIAGNOSTIC STUDIES: Oxygen Saturation is 99% on room air, normal by my interpretation.    COORDINATION OF CARE: 1:34 PM-Discussed treatment plan which includes blood work, pain and nausea medication, and UA with pt at bedside and pt agreed to plan.   Results for orders placed during the hospital encounter of 07/28/13  CBC WITH DIFFERENTIAL      Result Value Ref Range   WBC 6.1  4.0 - 10.5 K/uL   RBC 4.33  3.87 - 5.11 MIL/uL   Hemoglobin 13.9  12.0 - 15.0 g/dL   HCT 16.1  09.6 - 04.5 %   MCV 95.4  78.0 - 100.0  fL   MCH 32.1  26.0 - 34.0 pg   MCHC 33.7  30.0 - 36.0 g/dL   RDW 12.4  11.5 - 15.5 %   Platelets 275  150 - 400 K/uL   Neutrophils Relative % 64  43 - 77 %   Neutro Abs 4.0  1.7 - 7.7 K/uL   Lymphocytes Relative 29  12 - 46 %   Lymphs Abs 1.8  0.7 - 4.0 K/uL   Monocytes Relative 5  3 - 12 %   Monocytes Absolute 0.3  0.1 - 1.0 K/uL   Eosinophils Relative 2  0 - 5 %   Eosinophils Absolute 0.1  0.0 - 0.7 K/uL   Basophils Relative 1  0 - 1 %   Basophils Absolute 0.0  0.0 - 0.1 K/uL  COMPREHENSIVE METABOLIC PANEL      Result Value Ref Range   Sodium 143  137 - 147 mEq/L   Potassium 4.0  3.7 - 5.3 mEq/L   Chloride 106  96 - 112 mEq/L   CO2 26  19 - 32 mEq/L   Glucose, Bld 102 (*) 70 - 99 mg/dL   BUN 8  6 - 23 mg/dL   Creatinine, Ser 8.65  0.50 - 1.10 mg/dL   Calcium 9.5  8.4 - 78.4 mg/dL   Total Protein 7.6  6.0 - 8.3 g/dL   Albumin 3.8  3.5 - 5.2 g/dL   AST 13  0 - 37 U/L   ALT 12  0 - 35 U/L   Alkaline Phosphatase 118 (*) 39 - 117 U/L   Total Bilirubin 0.2 (*) 0.3 - 1.2 mg/dL   GFR calc non Af Amer >90  >90 mL/min   GFR calc Af Amer >90  >90 mL/min  URINALYSIS, ROUTINE W REFLEX MICROSCOPIC      Result Value Ref Range   Color, Urine YELLOW  YELLOW   APPearance CLEAR  CLEAR   Specific Gravity, Urine 1.010  1.005 - 1.030   pH 6.0  5.0 - 8.0   Glucose, UA NEGATIVE  NEGATIVE mg/dL   Hgb urine dipstick NEGATIVE  NEGATIVE   Bilirubin Urine NEGATIVE  NEGATIVE   Ketones, ur NEGATIVE  NEGATIVE mg/dL   Protein, ur NEGATIVE  NEGATIVE mg/dL   Urobilinogen, UA 0.2  0.0 - 1.0 mg/dL   Nitrite NEGATIVE  NEGATIVE   Leukocytes, UA NEGATIVE  NEGATIVE   No results found.   EKG Interpretation None     Medications  ketorolac (TORADOL) 30 MG/ML injection 30 mg (not administered)  morphine 4 MG/ML injection 4 mg (4 mg Intravenous Given 07/28/13 1412)  ondansetron (ZOFRAN) tablet 4 mg (4 mg Oral Given 07/28/13 1412)   MDM   Final diagnoses:  Chronic abdominal pain    Patient  presents with abdominal pain. Reports history of chronic abdominal pain. She primarily is seen in the emergency department for these complaints.  She does not have a primary care provider. She reports that she has a followup appointment to have corrective surgery on April 10. Her exam is benign. Patient was given pain medications in the ER. Workup including urine is negative. Have discussed with patient that it is inappropriate to come to the emergency department for chronic pain medications. Given that she does not have a PCP, will prescribe her a short course of norco.  She is encouraged to continue her present. Patient given contact information for [employer].  After history, exam, and medical workup I feel the patient has been appropriately medically screened and is safe for discharge home. Pertinent diagnoses were discussed with the patient. Patient was given return precautions.  I personally performed the services described in this documentation, which was scribed in my presence. The recorded information has been reviewed and is accurate.    Yolanda Baton, MD 07/28/13 [phone]

## 2013-07-28 NOTE — Discharge Instructions (Signed)
Abdominal Pain, Adult Many things can cause belly (abdominal) pain. Most times, the belly pain is not dangerous. Many cases of belly pain can be watched and treated at home. HOME CARE   Do not take medicines that help you go poop (laxatives) unless told to by your doctor.  Only take medicine as told by your doctor.  Eat or drink as told by your doctor. Your doctor will tell you if you should be on a special diet. GET HELP IF:  You do not know what is causing your belly pain.  You have belly pain while you are sick to your stomach (nauseous) or have runny poop (diarrhea).  You have pain while you pee or poop.  Your belly pain wakes you up at night.  You have belly pain that gets worse or better when you eat.  You have belly pain that gets worse when you eat fatty foods. GET HELP RIGHT AWAY IF:   The pain does not go away within 2 hours.  You have a fever.  You keep throwing up (vomiting).  The pain changes and is only in the right or left part of the belly.  You have bloody or tarry looking poop. MAKE SURE YOU:   Understand these instructions.  Will watch your condition.  Will get help right away if you are not doing well or get worse. Document Released: 10/09/2007 Document Revised: 02/10/2013 Document Reviewed: 12/30/2012 [facility] Patient Information 2014 [employer] [place].  Chronic Pain Discharge Instructions  Emergency care providers appreciate that many patients coming to us are in severe pain and we wish to address their pain in the safest, most responsible manner.  It is important to recognize however, that the proper treatment of chronic pain differs from that of the pain of injuries and acute illnesses.  Our goal is to provide quality, safe, personalized care and we thank you for giving us the opportunity to serve you. The use of narcotics and related agents for chronic pain syndromes may lead to additional physical and psychological problems.  Nearly as many people  die from prescription narcotics each year as die from car crashes.  Additionally, this risk is increased if such prescriptions are obtained from a variety of sources.  Therefore, only your primary care physician or a pain management specialist is able to safely treat such syndromes with narcotic medications long-term.    Documentation revealing such prescriptions have been sought from multiple sources may prohibit us from providing a refill or different narcotic medication.  Your name may be checked first through the [facility] [place] Controlled Substances Reporting System.  This database is a record of controlled substance medication prescriptions that the patient has received.  This has been established by [facility] [place] [facility] in an effort to eliminate the dangerous, and often life threatening, practice of obtaining multiple prescriptions from different medical providers.   If you have a chronic pain syndrome (i.e. chronic headaches, recurrent back or neck pain, dental pain, abdominal or pelvis pain without a specific diagnosis, or neuropathic pain such as fibromyalgia) or recurrent visits for the same condition without an acute diagnosis, you may be treated with non-narcotics and other non-addictive medicines.  Allergic reactions or negative side effects that may be reported by a patient to such medications will not typically lead to the use of a narcotic analgesic or other controlled substance as an alternative.   Patients managing chronic pain with a personal physician should have provisions in place for breakthrough pain.  If you are  in crisis, you should call your physician.  If your physician directs you to the emergency department, please have the doctor call and speak to our attending physician concerning your care.   When patients come to the Emergency Department (ED) with acute medical conditions in which the Emergency Department physician feels appropriate to prescribe narcotic or sedating pain  medication, the physician will prescribe these in very limited quantities.  The amount of these medications will last only until you can see your primary care physician in his/her office.  Any patient who returns to the ED seeking refills should expect only non-narcotic pain medications.   In the event of an acute medical condition exists and the emergency physician feels it is necessary that the patient be given a narcotic or sedating medication -  a responsible adult driver should be present in the room prior to the medication being given by the nurse.   Prescriptions for narcotic or sedating medications that have been lost, stolen or expired will not be refilled in the Emergency Department.    Patients who have chronic pain may receive non-narcotic prescriptions until seen by their primary care physician.  It is every patients personal responsibility to maintain active prescriptions with his or her primary care physician or specialist.

## 2013-07-28 NOTE — ED Notes (Signed)
Pt states gets headache with toradol. Stated will still take it. EDP aware.

## 2013-07-28 NOTE — ED Notes (Signed)
Pt reports 3 years ago had a bladder sling put in and has had intermittent pain in lower abd since then.

## 2013-08-04 ENCOUNTER — Emergency Department (HOSPITAL_COMMUNITY)
Admission: EM | Admit: 2013-08-04 | Discharge: 2013-08-04 | Disposition: A | Discharge status: Home / Self Care | Payer: Self-pay | Attending: Emergency Medicine | Admitting: Emergency Medicine

## 2013-08-04 ENCOUNTER — Encounter (HOSPITAL_COMMUNITY): Payer: Self-pay | Admitting: Emergency Medicine

## 2013-08-04 DIAGNOSIS — Z791 Long term (current) use of non-steroidal anti-inflammatories (NSAID): Secondary | ICD-10-CM | POA: Insufficient documentation

## 2013-08-04 DIAGNOSIS — K219 Gastro-esophageal reflux disease without esophagitis: Secondary | ICD-10-CM | POA: Insufficient documentation

## 2013-08-04 DIAGNOSIS — F329 Major depressive disorder, single episode, unspecified: Secondary | ICD-10-CM | POA: Insufficient documentation

## 2013-08-04 DIAGNOSIS — N189 Chronic kidney disease, unspecified: Secondary | ICD-10-CM | POA: Insufficient documentation

## 2013-08-04 DIAGNOSIS — R109 Unspecified abdominal pain: Secondary | ICD-10-CM

## 2013-08-04 DIAGNOSIS — G40909 Epilepsy, unspecified, not intractable, without status epilepticus: Secondary | ICD-10-CM | POA: Insufficient documentation

## 2013-08-04 DIAGNOSIS — Z8711 Personal history of peptic ulcer disease: Secondary | ICD-10-CM | POA: Insufficient documentation

## 2013-08-04 DIAGNOSIS — Z90710 Acquired absence of both cervix and uterus: Secondary | ICD-10-CM | POA: Insufficient documentation

## 2013-08-04 DIAGNOSIS — I129 Hypertensive chronic kidney disease with stage 1 through stage 4 chronic kidney disease, or unspecified chronic kidney disease: Secondary | ICD-10-CM | POA: Insufficient documentation

## 2013-08-04 DIAGNOSIS — Z9889 Other specified postprocedural states: Secondary | ICD-10-CM | POA: Insufficient documentation

## 2013-08-04 DIAGNOSIS — Z79899 Other long term (current) drug therapy: Secondary | ICD-10-CM | POA: Insufficient documentation

## 2013-08-04 DIAGNOSIS — Z9089 Acquired absence of other organs: Secondary | ICD-10-CM | POA: Insufficient documentation

## 2013-08-04 DIAGNOSIS — G8929 Other chronic pain: Secondary | ICD-10-CM | POA: Insufficient documentation

## 2013-08-04 DIAGNOSIS — Z8619 Personal history of other infectious and parasitic diseases: Secondary | ICD-10-CM | POA: Insufficient documentation

## 2013-08-04 DIAGNOSIS — F17200 Nicotine dependence, unspecified, uncomplicated: Secondary | ICD-10-CM | POA: Insufficient documentation

## 2013-08-04 DIAGNOSIS — R1084 Generalized abdominal pain: Secondary | ICD-10-CM | POA: Insufficient documentation

## 2013-08-04 DIAGNOSIS — J4489 Other specified chronic obstructive pulmonary disease: Secondary | ICD-10-CM | POA: Insufficient documentation

## 2013-08-04 DIAGNOSIS — F411 Generalized anxiety disorder: Secondary | ICD-10-CM | POA: Insufficient documentation

## 2013-08-04 DIAGNOSIS — Z87442 Personal history of urinary calculi: Secondary | ICD-10-CM | POA: Insufficient documentation

## 2013-08-04 DIAGNOSIS — J449 Chronic obstructive pulmonary disease, unspecified: Secondary | ICD-10-CM | POA: Insufficient documentation

## 2013-08-04 DIAGNOSIS — F3289 Other specified depressive episodes: Secondary | ICD-10-CM | POA: Insufficient documentation

## 2013-08-04 LAB — URINALYSIS, ROUTINE W REFLEX MICROSCOPIC
Bilirubin Urine: NEGATIVE
Glucose, UA: NEGATIVE mg/dL
HGB URINE DIPSTICK: NEGATIVE
Ketones, ur: NEGATIVE mg/dL
Leukocytes, UA: NEGATIVE
NITRITE: NEGATIVE
Protein, ur: NEGATIVE mg/dL
Specific Gravity, Urine: <1.005 — ABNORMAL LOW (ref 1.005–1.030)
UROBILINOGEN UA: 0.2 mg/dL (ref 0.0–1.0)
pH: 6 (ref 5.0–8.0)

## 2013-08-04 MED ORDER — OXYCODONE-ACETAMINOPHEN 5-325 MG PO TABS
2.0000 | ORAL_TABLET | Freq: Once | ORAL | Status: AC
  Administered 2013-08-04: 2 via ORAL
  Filled 2013-08-04: qty 2

## 2013-08-04 NOTE — ED Notes (Signed)
Patient c/o lower abdominal pain; states has bladder mesh sling that needs to be removed.  States has appointment on 4/10 to see MD about having removed.

## 2013-08-04 NOTE — ED Provider Notes (Addendum)
CSN: [id number]     Arrival date & time 08/04/13  1744 History  This chart was scribed for Yolanda HornJohn M Adams Eutsler, MD by [name], ED Scribe. This patient was seen in room APA09/APA09 and the patient's care was started at 10:29 PM.    Chief Complaint  Patient presents with  . Abdominal Pain      The history is provided by the patient. No language interpreter was used.   HPI Comments: Yolanda GoodpastureVicky G Adams is a 48 y.o. female who presents to the Emergency Department complaining of chronic abdominal pain for the last few years gradually worsening the last several months. Pt reports that she goes to the health department to get her blood pressure medicine and to see a psychiatrist. She also reports she has an appointment 4/10 about the removal of her bladder mesh sling. She states that she has received all of her pain medication from the ED. Pt reports associated nausea. She denies vomiting and fever. Pt requested dilaudid shot and prescription refill for percocet. She has received multiple prescriptions for Percocet from the ED previously for same chronic pain. No fever, CP, SOB, V,D,bloody stools, dysuria. No change from prior visits for same chronic pain.       Past Medical History  Diagnosis Date  . GERD (gastroesophageal reflux disease)   . PUD (peptic ulcer disease)   . Anxiety and depression   . DDD (degenerative disc disease)   . COPD (chronic obstructive pulmonary disease)   . HTN (hypertension)   . Shoulder pain   . IBS (irritable bowel syndrome)   . Peptic stricture of esophagus     dilated twice 10/2009  . Helicobacter pylori gastritis June 2011    treated with Pylera  . Shortness of breath     continuous  . Chronic kidney disease     small rt kidney stone  . Seizures     had 1 episode of it about 5 years ago, no other hx of seizures    Past Surgical History  Procedure Laterality Date  . Cholecystectomy    . Bladder surgery      x 3 in [place]-strected twice and had  bladder sling placed  . Oophorectomy      Right  . Esophagogastroduodenoscopy  10/26/09 GASTRITIS/DUODENITIS    Distal esol stricture/dil 15-16 mm. A 17 mm dilator would not pass  . Upper gastrointestinal endoscopy  JUN 14, 2011DYSPHAGIA, EPIG PAIN    H PYLORI GASTRITIS  . Steroid shot in back    . Savory dilation  04/15/2011    NWG:[id number]:Moderate gastritis/Duodenitis  . Ileocolonoscopy  04/15/2011    SLF: Colitis in the sigmoid colon/Internal hemorrhoids  . Abdominal hysterectomy      Family History  Problem Relation Age of Onset  . Prostate cancer Father     Partial gastrectomy  . Colon cancer Neg Hx   . Anesthesia problems Neg Hx   . Hypotension Neg Hx   . Malignant hyperthermia Neg Hx   . Pseudochol deficiency Neg Hx     History  Substance Use Topics  . Smoking status: Current Every Day Smoker -- 1.00 packs/day for 31 years    Types: Cigarettes  . Smokeless tobacco: Not on file  . Alcohol Use: No    OB History   Grav Para Term Preterm Abortions TAB SAB Ect Mult Living                  Review of Systems 10 Systems reviewed  and are negative for acute change except as noted in the HPI.   Allergies  Toradol  Home Medications   Current Outpatient Rx  Name  Route  Sig  Dispense  Refill  . acetaminophen (TYLENOL EX ST ARTHRITIS PAIN) 500 MG tablet   Oral   Take 500 mg by mouth every 6 (six) hours as needed.         Marland Kitchen albuterol (PROVENTIL HFA;VENTOLIN HFA) 108 (90 BASE) MCG/ACT inhaler   Inhalation   Inhale 2 puffs into the lungs every 6 (six) hours as needed for wheezing or shortness of breath.         . citalopram (CELEXA) 40 MG tablet   Oral   Take 40 mg by mouth daily.         . diazepam (VALIUM) 10 MG tablet   Oral   Take 10 mg by mouth 3 (three) times daily.         Marland Kitchen gabapentin (NEURONTIN) 300 MG capsule   Oral   Take 300 mg by mouth 3 (three) times daily.          . hydrochlorothiazide 25 MG tablet   Oral   Take 12.5 mg by mouth daily.           Marland Kitchen ibuprofen (ADVIL,MOTRIN) 200 MG tablet   Oral   Take 800 mg by mouth daily.         Marland Kitchen omeprazole (PRILOSEC) 20 MG capsule   Oral   Take 20 mg by mouth daily.           . traZODone (DESYREL) 100 MG tablet   Oral   Take 100 mg by mouth at bedtime.         Marland Kitchen HYDROcodone-acetaminophen (NORCO/VICODIN) 5-325 MG per tablet   Oral   Take 1 tablet by mouth every 4 (four) hours as needed.   10 tablet   0    Triage Vitals: BP 135/65  Pulse 72  Temp(Src) 97.9 F (36.6 C) (Oral)  Resp 20  Ht 5\' 2"  (1.575 m)  Wt 171 lb (77.565 kg)  BMI 31.27 kg/m2  SpO2 99%  LMP 03/30/2011  Physical Exam  Nursing note and vitals reviewed. Constitutional:  Awake, alert, nontoxic appearance.  HENT:  Head: Atraumatic.  Eyes: Right eye exhibits no discharge. Left eye exhibits no discharge.  Neck: Neck supple.  Cardiovascular: Normal rate and regular rhythm.   No murmur heard. Pulmonary/Chest: Effort normal and breath sounds normal. No respiratory distress. She has no wheezes. She has no rales. She exhibits no tenderness.  Abdominal: Soft. Bowel sounds are normal. She exhibits no distension and no mass. There is tenderness. There is no rebound and no guarding.  Diffuse moderate abdominal tenderness with palpation  Musculoskeletal: She exhibits no edema and no tenderness.  Baseline ROM, no obvious new focal weakness.  Neurological: She is alert.  Mental status and motor strength appears baseline for patient and situation.  Skin: No rash noted.  Psychiatric: She has a normal mood and affect.    ED Course  Procedures (including critical care time)  DIAGNOSTIC STUDIES: Oxygen Saturation is 99% on RA, normal by my interpretation.    COORDINATION OF CARE: 10:40 PM- Pt  advised of plan for treatment. Parents verbalize understanding and agreement with plan. Pt upset about not receiving Dilaudid in ED and Rx for Percocet. Explained ED guidelines for chronic pain mgmt. Patient  informed of clinical course, understand medical decision-making process, and agree with plan. Pt aware GI  has not recommended narcotics for her chronic abdominal pain.     Labs Review Labs Reviewed  URINALYSIS, ROUTINE W REFLEX MICROSCOPIC - Abnormal; Notable for the following:    Specific Gravity, Urine <1.005 (*)    All other components within normal limits   Imaging Review No results found.   EKG Interpretation None      MDM   Final diagnoses:  Chronic abdominal pain    I doubt any other EMC precluding discharge at this time including, but not necessarily limited to the following:SBI, peritonitis.  I personally performed the services described in this documentation, which was scribed in my presence. The recorded information has been reviewed and is accurate.    Yolanda Horn, MD 08/06/13 1457  Yolanda Horn, MD 08/06/13 1501  Yolanda Horn, MD 08/06/13 [phone]

## 2013-08-04 NOTE — ED Notes (Signed)
Pt. Pending discharge after results of urine. Pt. Refused to wait for discharge instructions.

## 2013-08-04 NOTE — ED Notes (Signed)
MD at bedside. 

## 2013-08-04 NOTE — Discharge Instructions (Signed)
Abdominal (belly) pain can be caused by many things. Your caregiver performed an examination and possibly ordered blood/urine tests and imaging (CT scan, x-rays, ultrasound). Many cases can be observed and treated at home after initial evaluation in the emergency department. Even though you are being discharged home, abdominal pain can be unpredictable. Therefore, you need a repeated exam if your pain does not resolve, returns, or worsens. Most patients with abdominal pain don't have to be admitted to the hospital or have surgery, but serious problems like appendicitis and gallbladder attacks can start out as nonspecific pain. Many abdominal conditions cannot be diagnosed in one visit, so follow-up evaluations are very important. °SEEK IMMEDIATE MEDICAL ATTENTION IF: °The pain does not go away or becomes severe.  °A temperature above 101 develops.  °Repeated vomiting occurs (multiple episodes).  °The pain becomes localized to portions of the abdomen. The right side could possibly be appendicitis. In an adult, the left lower portion of the abdomen could be colitis or diverticulitis.  °Blood is being passed in stools or vomit (bright red or black tarry stools).  °Return also if you develop chest pain, difficulty breathing, dizziness or fainting, or become confused, poorly responsive, or inconsolable (young children). ° °Chronic Pain Discharge Instructions  °Emergency care providers appreciate that many patients coming to us are in severe pain and we wish to address their pain in the safest, most responsible manner.  It is important to recognize however, that the proper treatment of chronic pain differs from that of the pain of injuries and acute illnesses.  Our goal is to provide quality, safe, personalized care and we thank you for giving us the opportunity to serve you. °The use of narcotics and related agents for chronic pain syndromes may lead to additional physical and psychological problems.  Nearly as many  people die from prescription narcotics each year as die from car crashes.  Additionally, this risk is increased if such prescriptions are obtained from a variety of sources.  Therefore, only your primary care physician or a pain management specialist is able to safely treat such syndromes with narcotic medications long-term.   ° °Documentation revealing such prescriptions have been sought from multiple sources may prohibit us from providing a refill or different narcotic medication.  Your name may be checked first through the [place] Controlled Substances Reporting System.  This database is a record of controlled substance medication prescriptions that the patient has received.  This has been established by  in an effort to eliminate the dangerous, and often life threatening, practice of obtaining multiple prescriptions from different medical providers.  ° °If you have a chronic pain syndrome (i.e. chronic headaches, recurrent back or neck pain, dental pain, abdominal or pelvis pain without a specific diagnosis, or neuropathic pain such as fibromyalgia) or recurrent visits for the same condition without an acute diagnosis, you may be treated with non-narcotics and other non-addictive medicines.  Allergic reactions or negative side effects that may be reported by a patient to such medications will not typically lead to the use of a narcotic analgesic or other controlled substance as an alternative. °  °Patients managing chronic pain with a personal physician should have provisions in place for breakthrough pain.  If you are in crisis, you should call your physician.  If your physician directs you to the emergency department, please have the doctor call and speak to our attending physician concerning your care. °  °When patients come to the Emergency Department (ED) with acute   medical conditions in which the Emergency Department physician feels appropriate to prescribe narcotic or sedating pain  medication, the physician will prescribe these in very limited quantities.  The amount of these medications will last only until you can see your primary care physician in his/her office.  Any patient who returns to the ED seeking refills should expect only non-narcotic pain medications.  ° °In the event of an acute medical condition exists and the emergency physician feels it is necessary that the patient be given a narcotic or sedating medication -  a responsible adult driver should be present in the room prior to the medication being given by the nurse. °  °Prescriptions for narcotic or sedating medications that have been lost, stolen or expired will not be refilled in the Emergency Department.   ° °Patients who have chronic pain may receive non-narcotic prescriptions until seen by their primary care physician.  It is every patient’s personal responsibility to maintain active prescriptions with his or her primary care physician or specialist. °

## 2013-08-13 ENCOUNTER — Ambulatory Visit: Payer: Self-pay | Admitting: Urology

## 2013-08-13 ENCOUNTER — Encounter (HOSPITAL_COMMUNITY): Payer: Self-pay | Admitting: Emergency Medicine

## 2013-08-13 ENCOUNTER — Emergency Department (HOSPITAL_COMMUNITY)
Admission: EM | Admit: 2013-08-13 | Discharge: 2013-08-13 | Disposition: A | Discharge status: Home / Self Care | Payer: Self-pay | Attending: Emergency Medicine | Admitting: Emergency Medicine

## 2013-08-13 DIAGNOSIS — N390 Urinary tract infection, site not specified: Secondary | ICD-10-CM

## 2013-08-13 DIAGNOSIS — I129 Hypertensive chronic kidney disease with stage 1 through stage 4 chronic kidney disease, or unspecified chronic kidney disease: Secondary | ICD-10-CM | POA: Insufficient documentation

## 2013-08-13 DIAGNOSIS — Z79899 Other long term (current) drug therapy: Secondary | ICD-10-CM | POA: Insufficient documentation

## 2013-08-13 DIAGNOSIS — J449 Chronic obstructive pulmonary disease, unspecified: Secondary | ICD-10-CM | POA: Insufficient documentation

## 2013-08-13 DIAGNOSIS — N179 Acute kidney failure, unspecified: Secondary | ICD-10-CM

## 2013-08-13 DIAGNOSIS — F17200 Nicotine dependence, unspecified, uncomplicated: Secondary | ICD-10-CM | POA: Insufficient documentation

## 2013-08-13 DIAGNOSIS — K219 Gastro-esophageal reflux disease without esophagitis: Secondary | ICD-10-CM | POA: Insufficient documentation

## 2013-08-13 DIAGNOSIS — N189 Chronic kidney disease, unspecified: Secondary | ICD-10-CM | POA: Insufficient documentation

## 2013-08-13 DIAGNOSIS — Z9089 Acquired absence of other organs: Secondary | ICD-10-CM | POA: Insufficient documentation

## 2013-08-13 DIAGNOSIS — J4489 Other specified chronic obstructive pulmonary disease: Secondary | ICD-10-CM | POA: Insufficient documentation

## 2013-08-13 DIAGNOSIS — F411 Generalized anxiety disorder: Secondary | ICD-10-CM | POA: Insufficient documentation

## 2013-08-13 DIAGNOSIS — F329 Major depressive disorder, single episode, unspecified: Secondary | ICD-10-CM | POA: Insufficient documentation

## 2013-08-13 DIAGNOSIS — R109 Unspecified abdominal pain: Secondary | ICD-10-CM

## 2013-08-13 DIAGNOSIS — Z8739 Personal history of other diseases of the musculoskeletal system and connective tissue: Secondary | ICD-10-CM | POA: Insufficient documentation

## 2013-08-13 DIAGNOSIS — F3289 Other specified depressive episodes: Secondary | ICD-10-CM | POA: Insufficient documentation

## 2013-08-13 LAB — URINALYSIS, ROUTINE W REFLEX MICROSCOPIC
Bilirubin Urine: NEGATIVE
Glucose, UA: 100 mg/dL — AB
Hgb urine dipstick: NEGATIVE
Ketones, ur: NEGATIVE mg/dL
Leukocytes, UA: NEGATIVE
Nitrite: POSITIVE — AB
Protein, ur: NEGATIVE mg/dL
Specific Gravity, Urine: 1.01 (ref 1.005–1.030)
Urobilinogen, UA: 1 mg/dL (ref 0.0–1.0)
pH: 5 (ref 5.0–8.0)

## 2013-08-13 LAB — BASIC METABOLIC PANEL
BUN: 11 mg/dL (ref 6–23)
CO2: 25 mEq/L (ref 19–32)
Calcium: 9.3 mg/dL (ref 8.4–10.5)
Chloride: 93 mEq/L — ABNORMAL LOW (ref 96–112)
Creatinine, Ser: 2.11 mg/dL — ABNORMAL HIGH (ref 0.50–1.10)
GFR calc Af Amer: 30 mL/min — ABNORMAL LOW (ref >90)
GFR calc non Af Amer: 26 mL/min — ABNORMAL LOW (ref >90)
Glucose, Bld: 126 mg/dL — ABNORMAL HIGH (ref 70–99)
Potassium: 3.7 mEq/L (ref 3.7–5.3)
Sodium: 131 mEq/L — ABNORMAL LOW (ref 137–147)

## 2013-08-13 LAB — CBC WITH DIFFERENTIAL/PLATELET
Basophils Absolute: 0 10*3/uL (ref 0.0–0.1)
Basophils Relative: 1 % (ref 0–1)
Eosinophils Absolute: 0.3 10*3/uL (ref 0.0–0.7)
Eosinophils Relative: 3 % (ref 0–5)
HCT: 40 % (ref 36.0–46.0)
Hemoglobin: 13.9 g/dL (ref 12.0–15.0)
Lymphocytes Relative: 34 % (ref 12–46)
Lymphs Abs: 2.9 10*3/uL (ref 0.7–4.0)
MCH: 33 pg (ref 26.0–34.0)
MCHC: 34.8 g/dL (ref 30.0–36.0)
MCV: 95 fL (ref 78.0–100.0)
Monocytes Absolute: 0.4 10*3/uL (ref 0.1–1.0)
Monocytes Relative: 5 % (ref 3–12)
Neutro Abs: 4.9 10*3/uL (ref 1.7–7.7)
Neutrophils Relative %: 58 % (ref 43–77)
Platelets: 270 10*3/uL (ref 150–400)
RBC: 4.21 MIL/uL (ref 3.87–5.11)
RDW: 12.3 % (ref 11.5–15.5)
WBC: 8.5 10*3/uL (ref 4.0–10.5)

## 2013-08-13 LAB — URINE MICROSCOPIC-ADD ON

## 2013-08-13 MED ORDER — SODIUM CHLORIDE 0.9 % IV BOLUS (SEPSIS): 1000.0000 mL | Freq: Once | INTRAVENOUS | Status: DC

## 2013-08-13 MED ORDER — SODIUM CHLORIDE 0.9 % IV BOLUS (SEPSIS)
2000.0000 mL | Freq: Once | INTRAVENOUS | Status: AC
  Administered 2013-08-13: 1000 mL via INTRAVENOUS

## 2013-08-13 MED ORDER — ONDANSETRON HCL 4 MG/2ML IJ SOLN
4.0000 mg | Freq: Once | INTRAMUSCULAR | Status: AC
  Administered 2013-08-13: 4 mg via INTRAVENOUS

## 2013-08-13 MED ORDER — ONDANSETRON HCL 4 MG/2ML IJ SOLN
4.0000 mg | Freq: Once | INTRAMUSCULAR | Status: DC
  Filled 2013-08-13: qty 2

## 2013-08-13 MED ORDER — ONDANSETRON HCL 4 MG/2ML IJ SOLN: 4.0000 mg | Freq: Once | INTRAMUSCULAR | Status: DC

## 2013-08-13 MED ORDER — HYDROMORPHONE HCL PF 1 MG/ML IJ SOLN
1.0000 mg | Freq: Once | INTRAMUSCULAR | Status: AC
  Administered 2013-08-13: 1 mg via INTRAVENOUS
  Filled 2013-08-13: qty 1

## 2013-08-13 MED ORDER — CEFTRIAXONE SODIUM 1 G IJ SOLR
1.0000 g | Freq: Once | INTRAMUSCULAR | Status: AC
  Administered 2013-08-13: 1 g via INTRAVENOUS
  Filled 2013-08-13: qty 10

## 2013-08-13 NOTE — ED Provider Notes (Signed)
CSN: [id number]     Arrival date & time 08/13/13  1617 History  This chart was scribed for Raeford RazorStephen Blayde Bacigalupi, MD by Shari HeritageAisha Amuda, ED Scribe. The patient was seen in room APA19/APA19. Patient's care was started at 6:59 PM.  Chief Complaint  Patient presents with  . Abdominal Pain    Patient is a 48 y.o. female presenting with abdominal pain. The history is provided by the patient. No language interpreter was used.  Abdominal Pain Pain location: lower. Pain radiates to:  Does not radiate Pain severity:  Severe Timing:  Intermittent Chronicity:  Recurrent Associated symptoms: dysuria   Associated symptoms: no chills, no diarrhea, no fever, no nausea, no vaginal bleeding, no vaginal discharge and no vomiting     HPI Comments: Yolanda Adams is a 48 y.o. female who has a mesh bladder sling placed in 2011 for urinary incontinence and presents to the Emergency Department complaining of intermittent lower abdominal pain that she attributes to her bladder sling. She states that she typically has discomfort every day, but today it has been severe and she cannot control it at home. She states that she has been experiencing urinary incontinence again for the past 1 month. There is also associated urinary urgency and dysuria. She has been taking Azo for dysuria without relief. Patient states that she had an appointment schedule today with a urologist to discuss having her sling removed but this did not happen. She denies fever, chills, dysuria, vaginal bleeding, vaginal discharge, nausea, vomiting, or diarrhea. Her other medical history includes COPD, HTN, IBS, GERD.  She does not have a PCP  Past Medical History  Diagnosis Date  . GERD (gastroesophageal reflux disease)   . PUD (peptic ulcer disease)   . Anxiety and depression   . DDD (degenerative disc disease)   . COPD (chronic obstructive pulmonary disease)   . HTN (hypertension)   . Shoulder pain   . IBS (irritable bowel syndrome)   . Peptic  stricture of esophagus     dilated twice 10/2009  . Helicobacter pylori gastritis June 2011    treated with Pylera  . Shortness of breath     continuous  . Seizures     had 1 episode of it about 5 years ago, no other hx of seizures  . Chronic kidney disease     small rt kidney stone   Past Surgical History  Procedure Laterality Date  . Cholecystectomy    . Bladder surgery      x 3 in [place]-strected twice and had bladder sling placed  . Oophorectomy      Right  . Esophagogastroduodenoscopy  10/26/09 GASTRITIS/DUODENITIS    Distal esol stricture/dil 15-16 mm. A 17 mm dilator would not pass  . Upper gastrointestinal endoscopy  JUN 14, 2011DYSPHAGIA, EPIG PAIN    H PYLORI GASTRITIS  . Steroid shot in back    . Savory dilation  04/15/2011    WUJ:[id number]:Moderate gastritis/Duodenitis  . Ileocolonoscopy  04/15/2011    SLF: Colitis in the sigmoid colon/Internal hemorrhoids  . Abdominal hysterectomy     Family History  Problem Relation Age of Onset  . Prostate cancer Father     Partial gastrectomy  . Colon cancer Neg Hx   . Anesthesia problems Neg Hx   . Hypotension Neg Hx   . Malignant hyperthermia Neg Hx   . Pseudochol deficiency Neg Hx    History  Substance Use Topics  . Smoking status: Current Every Day Smoker -- 1.00 packs/day for  31 years    Types: Cigarettes  . Smokeless tobacco: Not on file  . Alcohol Use: No   OB History   Grav Para Term Preterm Abortions TAB SAB Ect Mult Living                 Review of Systems  Constitutional: Negative for fever and chills.  Gastrointestinal: Positive for abdominal pain. Negative for nausea, vomiting and diarrhea.  Genitourinary: Positive for dysuria and urgency. Negative for vaginal bleeding and vaginal discharge.  All other systems reviewed and are negative.   Allergies  Toradol  Home Medications   Current Outpatient Rx  Name  Route  Sig  Dispense  Refill  . acetaminophen (TYLENOL EX ST ARTHRITIS PAIN) 500 MG tablet   Oral    Take 500 mg by mouth every 6 (six) hours as needed.         Marland Kitchen albuterol (PROVENTIL HFA;VENTOLIN HFA) 108 (90 BASE) MCG/ACT inhaler   Inhalation   Inhale 2 puffs into the lungs every 6 (six) hours as needed for wheezing or shortness of breath.         . citalopram (CELEXA) 40 MG tablet   Oral   Take 40 mg by mouth daily.         . diazepam (VALIUM) 10 MG tablet   Oral   Take 10 mg by mouth 3 (three) times daily.         Marland Kitchen gabapentin (NEURONTIN) 300 MG capsule   Oral   Take 300 mg by mouth 3 (three) times daily.          . hydrochlorothiazide 25 MG tablet   Oral   Take 12.5 mg by mouth daily.          Marland Kitchen ibuprofen (ADVIL,MOTRIN) 200 MG tablet   Oral   Take 800 mg by mouth daily.         Marland Kitchen omeprazole (PRILOSEC) 20 MG capsule   Oral   Take 20 mg by mouth daily.           . traZODone (DESYREL) 100 MG tablet   Oral   Take 100 mg by mouth at bedtime.         Marland Kitchen HYDROcodone-acetaminophen (NORCO/VICODIN) 5-325 MG per tablet   Oral   Take 1 tablet by mouth every 4 (four) hours as needed.   10 tablet   0    Triage Vitals: BP 141/63  Pulse 74  Temp(Src) 97.5 F (36.4 C) (Oral)  Resp 18  Ht 5\' 2"  (1.575 m)  Wt 171 lb (77.565 kg)  BMI 31.27 kg/m2  SpO2 95%  LMP 03/30/2011 Physical Exam  Nursing note and vitals reviewed. Constitutional: She appears well-developed and well-nourished. No distress.  HENT:  Head: Normocephalic and atraumatic.  Eyes: Conjunctivae are normal. Right eye exhibits no discharge. Left eye exhibits no discharge.  Neck: Neck supple.  Cardiovascular: Normal rate, regular rhythm and normal heart sounds.  Exam reveals no gallop and no friction rub.   No murmur heard. Pulmonary/Chest: Effort normal and breath sounds normal. No respiratory distress.  Abdominal: Soft. She exhibits no distension. There is tenderness (diffusely). There is no rebound and no guarding.  Well healed low abdominal incision. No CVA tenderness.  Musculoskeletal:  She exhibits no edema and no tenderness.  Neurological: She is alert.  Skin: Skin is warm and dry.  Psychiatric: She has a normal mood and affect. Her behavior is normal. Thought content normal.    ED Course  Procedures (  including critical care time) DIAGNOSTIC STUDIES: Oxygen Saturation is 95% on room air, adequate by my interpretation.    COORDINATION OF CARE: 7:07 PM- UA indicates that patient has a UTI and will treat for this with IV Rocephin. I will also order Dilaudid, Zofran and IV fluids. Her creatine is also elevated indicating kidney impairment so I have advised her to use resource guide in discharge instructions to establish with a family medicine doctor for further follow up regarding this. Discussed the need for repeat BMP in a few days to a week. Given 2,000cc NS. Instructed to hold HCTZ for 3 days. Patient informed of current plan for treatment and evaluation and agrees with plan at this time.    Labs Review Labs Reviewed  BASIC METABOLIC PANEL - Abnormal; Notable for the following:    Sodium 131 (*)    Chloride 93 (*)    Glucose, Bld 126 (*)    Creatinine, Ser 2.11 (*)    GFR calc non Af Amer 26 (*)    GFR calc Af Amer 30 (*)    All other components within normal limits  URINALYSIS, ROUTINE W REFLEX MICROSCOPIC - Abnormal; Notable for the following:    Color, Urine ORANGE (*)    Glucose, UA 100 (*)    Nitrite POSITIVE (*)    All other components within normal limits  URINE MICROSCOPIC-ADD ON - Abnormal; Notable for the following:    Squamous Epithelial / LPF FEW (*)    Bacteria, UA FEW (*)    All other components within normal limits  URINE CULTURE  CBC WITH DIFFERENTIAL   Imaging Review No results found.   EKG Interpretation None      MDM   Final diagnoses:  Abdominal pain  AKI (acute kidney injury)  UTI (urinary tract infection)    48yF with abdominal pain. Perseverating on that she thinks it's related to abdominal mesh. Exam with mild diffuse  tenderness. Not peritoneal. Pain chronic. Pressing for more pain medications. Advised to discuss further with PCP, urology or pain management. W/u significant for elevated Cr which is change from 3 weeks ago. Given 2L NS. Hold HCTZ for 3 days. Needs repeat BMP next week. Return precautions discussed.   I personally preformed the services scribed in my presence. The recorded information has been reviewed is accurate. Raeford Razor, MD.    Raeford Razor, MD 08/19/13 [phone]

## 2013-08-13 NOTE — ED Notes (Signed)
Low abd pain, says is due to  Bladder sling.

## 2013-08-15 LAB — URINE CULTURE
Colony Count: NO GROWTH
Culture: NO GROWTH

## 2013-08-23 ENCOUNTER — Telehealth: Payer: Self-pay | Admitting: Gastroenterology

## 2013-08-23 NOTE — Telephone Encounter (Signed)
Pt LMOM after hours that she is needing to see SF because she can't swallow and she needs another EGD and also mentioned that she has IBS. Patient was D/Ced from our practice in January 2014 due to multiple NO SHOWS and a letter was sent. [phone]

## 2013-08-24 NOTE — Telephone Encounter (Signed)
PLEASE CALL PT. Let her know she was discharged from our practice so she will need to see Dr. Teena DunkBenson in [place] or [facility] in [place].

## 2013-08-24 NOTE — Telephone Encounter (Signed)
i spoke with patient and told her that she had been released from our practice back in Dec 2013 and she wanted to know why. I told her that she had made several appointments and no showed all of them. I offered to give her numbers for her to call, but she said that her cell phone was about to die and she would call me back on her house phone. I'm waiting, but no call as of yet.

## 2013-09-02 ENCOUNTER — Encounter (HOSPITAL_COMMUNITY): Payer: Self-pay | Admitting: Emergency Medicine

## 2013-09-02 ENCOUNTER — Emergency Department (HOSPITAL_COMMUNITY)
Admission: EM | Admit: 2013-09-02 | Discharge: 2013-09-02 | Disposition: A | Discharge status: Home / Self Care | Payer: Self-pay | Attending: Emergency Medicine | Admitting: Emergency Medicine

## 2013-09-02 ENCOUNTER — Encounter: Payer: Self-pay | Admitting: Obstetrics & Gynecology

## 2013-09-02 ENCOUNTER — Ambulatory Visit (INDEPENDENT_AMBULATORY_CARE_PROVIDER_SITE_OTHER): Payer: Self-pay | Admitting: Obstetrics & Gynecology

## 2013-09-02 VITALS — BP 120/70 | Wt 167.0 lb

## 2013-09-02 DIAGNOSIS — N189 Chronic kidney disease, unspecified: Secondary | ICD-10-CM | POA: Insufficient documentation

## 2013-09-02 DIAGNOSIS — Z960 Presence of urogenital implants: Secondary | ICD-10-CM

## 2013-09-02 DIAGNOSIS — G8929 Other chronic pain: Secondary | ICD-10-CM | POA: Insufficient documentation

## 2013-09-02 DIAGNOSIS — R569 Unspecified convulsions: Secondary | ICD-10-CM | POA: Insufficient documentation

## 2013-09-02 DIAGNOSIS — Z79899 Other long term (current) drug therapy: Secondary | ICD-10-CM | POA: Insufficient documentation

## 2013-09-02 DIAGNOSIS — J4489 Other specified chronic obstructive pulmonary disease: Secondary | ICD-10-CM | POA: Insufficient documentation

## 2013-09-02 DIAGNOSIS — Z9089 Acquired absence of other organs: Secondary | ICD-10-CM | POA: Insufficient documentation

## 2013-09-02 DIAGNOSIS — Z9889 Other specified postprocedural states: Secondary | ICD-10-CM | POA: Insufficient documentation

## 2013-09-02 DIAGNOSIS — F3289 Other specified depressive episodes: Secondary | ICD-10-CM | POA: Insufficient documentation

## 2013-09-02 DIAGNOSIS — Z8739 Personal history of other diseases of the musculoskeletal system and connective tissue: Secondary | ICD-10-CM | POA: Insufficient documentation

## 2013-09-02 DIAGNOSIS — F329 Major depressive disorder, single episode, unspecified: Secondary | ICD-10-CM | POA: Insufficient documentation

## 2013-09-02 DIAGNOSIS — K219 Gastro-esophageal reflux disease without esophagitis: Secondary | ICD-10-CM | POA: Insufficient documentation

## 2013-09-02 DIAGNOSIS — F17200 Nicotine dependence, unspecified, uncomplicated: Secondary | ICD-10-CM | POA: Insufficient documentation

## 2013-09-02 DIAGNOSIS — R109 Unspecified abdominal pain: Secondary | ICD-10-CM | POA: Insufficient documentation

## 2013-09-02 DIAGNOSIS — F411 Generalized anxiety disorder: Secondary | ICD-10-CM | POA: Insufficient documentation

## 2013-09-02 DIAGNOSIS — I10 Essential (primary) hypertension: Secondary | ICD-10-CM | POA: Insufficient documentation

## 2013-09-02 DIAGNOSIS — J449 Chronic obstructive pulmonary disease, unspecified: Secondary | ICD-10-CM | POA: Insufficient documentation

## 2013-09-02 DIAGNOSIS — Z90710 Acquired absence of both cervix and uterus: Secondary | ICD-10-CM | POA: Insufficient documentation

## 2013-09-02 DIAGNOSIS — Z8711 Personal history of peptic ulcer disease: Secondary | ICD-10-CM | POA: Insufficient documentation

## 2013-09-02 LAB — URINALYSIS, ROUTINE W REFLEX MICROSCOPIC
Bilirubin Urine: NEGATIVE
GLUCOSE, UA: NEGATIVE mg/dL
Hgb urine dipstick: NEGATIVE
KETONES UR: NEGATIVE mg/dL
LEUKOCYTES UA: NEGATIVE
Nitrite: NEGATIVE
PH: 6 (ref 5.0–8.0)
Protein, ur: NEGATIVE mg/dL
Specific Gravity, Urine: <1.005 — ABNORMAL LOW (ref 1.005–1.030)
Urobilinogen, UA: 0.2 mg/dL (ref 0.0–1.0)

## 2013-09-02 LAB — I-STAT CHEM 8, ED
BUN: 6 mg/dL (ref 6–23)
CHLORIDE: 101 meq/L (ref 96–112)
Calcium, Ion: 1.16 mmol/L (ref 1.12–1.23)
Creatinine, Ser: 1.3 mg/dL — ABNORMAL HIGH (ref 0.50–1.10)
Glucose, Bld: 94 mg/dL (ref 70–99)
HEMATOCRIT: 49 % — AB (ref 36.0–46.0)
Hemoglobin: 16.7 g/dL — ABNORMAL HIGH (ref 12.0–15.0)
Potassium: 3.8 mEq/L (ref 3.7–5.3)
Sodium: 139 mEq/L (ref 137–147)
TCO2: 25 mmol/L (ref 0–100)

## 2013-09-02 MED ORDER — HYDROMORPHONE HCL PF 2 MG/ML IJ SOLN
2.0000 mg | Freq: Once | INTRAMUSCULAR | Status: AC
  Administered 2013-09-02: 2 mg via INTRAMUSCULAR
  Filled 2013-09-02: qty 1

## 2013-09-02 MED ORDER — ONDANSETRON 8 MG PO TBDP
8.0000 mg | ORAL_TABLET | Freq: Once | ORAL | Status: AC
  Administered 2013-09-02: 8 mg via ORAL
  Filled 2013-09-02: qty 1

## 2013-09-02 MED ORDER — OXYCODONE-ACETAMINOPHEN 5-325 MG PO TABS: 1.0000 | ORAL_TABLET | ORAL | Status: DC | PRN

## 2013-09-02 NOTE — ED Notes (Signed)
On going abd/pelvic pain d/t bladder sling per pt.

## 2013-09-02 NOTE — Progress Notes (Signed)
Patient ID: Yolanda Adams, female   DOB: 17-Jun-1965, 48 y.o.   MRN: [id number] Pt history reviewed Pt informed I do not perform slings and therefore do not remove them and do not remove other physcians work  re contact Dr Hillis Rangeahlstadt for management decisions

## 2013-09-02 NOTE — Discharge Instructions (Signed)

## 2013-09-02 NOTE — ED Notes (Addendum)
Patient with no complaints at this time. Respirations even and unlabored. Skin warm/dry. Discharge instructions reviewed with patient at this time. Patient given opportunity to voice concerns/ask questions. Patient discharged at this time and left Emergency Department with steady gait.  Mother at bedside for patient ride home

## 2013-09-02 NOTE — ED Notes (Signed)
Awaiting patient's ride home to arrive Prior to discharge.

## 2013-09-05 NOTE — ED Provider Notes (Signed)
CSN: [id number]     Arrival date & time 09/02/13  1057 History   First MD Initiated Contact with Patient 09/02/13 1152     Chief Complaint  Patient presents with  . Pelvic Pain     (Consider location/radiation/quality/duration/timing/severity/associated sxs/prior Treatment) HPI Comments: Yolanda Adams is a 48 y.o. female who presents to the Emergency Department complaining of acute on chronic lower abd pain.  Patient has hx of chronic lower abdominal pain that she contributes to a bladder sling that was placed by a urologist that is no longer practicing in this area.  She states that she has been evaluated by another urologist who she states will not remove the sling and also by Dr. Despina HiddenEure who she states also will not remove it.  She states the pain to her lower abdomen today is similar to previous pain and she has ran out of her pain medication.  She denies vomiting, fever, dysuria, incontinence of bladder or bowel, diarrhea or vaginal pain.    The history is provided by the patient.    Past Medical History  Diagnosis Date  . GERD (gastroesophageal reflux disease)   . PUD (peptic ulcer disease)   . Anxiety and depression   . DDD (degenerative disc disease)   . COPD (chronic obstructive pulmonary disease)   . HTN (hypertension)   . Shoulder pain   . IBS (irritable bowel syndrome)   . Peptic stricture of esophagus     dilated twice 10/2009  . Helicobacter pylori gastritis June 2011    treated with Pylera  . Shortness of breath     continuous  . Seizures     had 1 episode of it about 5 years ago, no other hx of seizures  . Chronic kidney disease     small rt kidney stone   Past Surgical History  Procedure Laterality Date  . Cholecystectomy    . Bladder surgery      x 3 in [place]-strected twice and had bladder sling placed  . Oophorectomy      Right  . Esophagogastroduodenoscopy  10/26/09 GASTRITIS/DUODENITIS    Distal esol stricture/dil 15-16 mm. A 17 mm dilator would not pass    . Upper gastrointestinal endoscopy  JUN 14, 2011DYSPHAGIA, EPIG PAIN    H PYLORI GASTRITIS  . Steroid shot in back    . Savory dilation  04/15/2011    WUJ:[id number]:Moderate gastritis/Duodenitis  . Ileocolonoscopy  04/15/2011    SLF: Colitis in the sigmoid colon/Internal hemorrhoids  . Abdominal hysterectomy     Family History  Problem Relation Age of Onset  . Prostate cancer Father     Partial gastrectomy  . Colon cancer Neg Hx   . Anesthesia problems Neg Hx   . Hypotension Neg Hx   . Malignant hyperthermia Neg Hx   . Pseudochol deficiency Neg Hx    History  Substance Use Topics  . Smoking status: Current Every Day Smoker -- 1.00 packs/day for 31 years    Types: Cigarettes  . Smokeless tobacco: Not on file  . Alcohol Use: No   OB History   Grav Para Term Preterm Abortions TAB SAB Ect Mult Living                 Review of Systems  Constitutional: Negative for fever, chills and appetite change.  Respiratory: Negative for chest tightness and shortness of breath.   Cardiovascular: Negative for chest pain.  Gastrointestinal: Positive for abdominal pain. Negative for nausea, vomiting, blood in  stool, abdominal distention and anal bleeding.  Genitourinary: Negative for dysuria, hematuria, flank pain, decreased urine volume, vaginal bleeding, vaginal discharge and difficulty urinating.  Musculoskeletal: Negative for back pain.  Skin: Negative for color change and rash.  Neurological: Negative for dizziness, weakness and numbness.  Hematological: Negative for adenopathy.  All other systems reviewed and are negative.     Allergies  Toradol  Home Medications   Prior to Admission medications   Medication Sig Start Date End Date Taking? Authorizing Provider  acetaminophen (TYLENOL EX ST ARTHRITIS PAIN) 500 MG tablet Take 500 mg by mouth every 6 (six) hours as needed for moderate pain.    Yes Historical Provider, MD  albuterol (PROVENTIL HFA;VENTOLIN HFA) 108 (90 BASE) MCG/ACT  inhaler Inhale 2 puffs into the lungs every 6 (six) hours as needed for wheezing or shortness of breath.   Yes Historical Provider, MD  citalopram (CELEXA) 40 MG tablet Take 40 mg by mouth daily.   Yes Historical Provider, MD  diazepam (VALIUM) 10 MG tablet Take 10 mg by mouth 3 (three) times daily.   Yes Historical Provider, MD  gabapentin (NEURONTIN) 300 MG capsule Take 300 mg by mouth 3 (three) times daily.    Yes Historical Provider, MD  hydrochlorothiazide 25 MG tablet Take 12.5 mg by mouth daily.    Yes Historical Provider, MD  omeprazole (PRILOSEC) 20 MG capsule Take 20 mg by mouth daily as needed (heartburn).     Historical Provider, MD  oxyCODONE-acetaminophen (PERCOCET/ROXICET) 5-325 MG per tablet Take 1 tablet by mouth every 4 (four) hours as needed for severe pain. 09/02/13   Orlandria Kissner L. Pattiann Solanki, PA-C  traZODone (DESYREL) 100 MG tablet Take 100 mg by mouth at bedtime as needed for sleep.     Historical Provider, MD   BP 159/95  Pulse 81  Temp(Src) 97.5 F (36.4 C) (Oral)  Resp 18  SpO2 95%  LMP 03/30/2011 Physical Exam  Nursing note and vitals reviewed. Constitutional: She is oriented to person, place, and time. She appears well-developed and well-nourished. No distress.  HENT:  Head: Normocephalic and atraumatic.  Mouth/Throat: Oropharynx is clear and moist.  Cardiovascular: Normal rate, regular rhythm, normal heart sounds and intact distal pulses.   No murmur heard. Pulmonary/Chest: Effort normal and breath sounds normal. No respiratory distress. She exhibits no tenderness.  Abdominal: Soft. Normal appearance and bowel sounds are normal. She exhibits no distension and no mass. There is no hepatosplenomegaly. There is tenderness. There is no rigidity, no rebound, no guarding, no CVA tenderness and no tenderness at McBurney's point.    Mild ttp of the entire lower abdomen.  No guarding, distention, or rebound tenderness.    Musculoskeletal: Normal range of motion. She exhibits  no edema.  Neurological: She is alert and oriented to person, place, and time. She exhibits normal muscle tone. Coordination normal.  Skin: Skin is warm and dry.    ED Course  Procedures (including critical care time) Labs Review Labs Reviewed  URINALYSIS, ROUTINE W REFLEX MICROSCOPIC - Abnormal; Notable for the following:    Specific Gravity, Urine <1.005 (*)    All other components within normal limits  I-STAT CHEM 8, ED - Abnormal; Notable for the following:    Creatinine, Ser 1.30 (*)    Hemoglobin 16.7 (*)    HCT 49.0 (*)    All other components within normal limits    Imaging Review No results found.   EKG Interpretation None      MDM   Final  diagnoses:  Chronic abdominal pain    Previous ed charts reviewed by me.  Pt has multiple ED visits for chronic abdominal pain.  States that she has an appt for May 5 with Dr. Annabell HowellsWrenn to discuss removal for her bladder sling that she feels is the cause of her chronic pain.    I stat and U/A are wnml on this vist  Pt is feeling better after medication and requesting d/c.  She is well appearing, VSS and non-toxic appearing. No concerning sx's for acute abd on this visit.   Pt had nml labs on her last visit on 08/13/13 and CT of the abd/pelvis from Nov 2014 that  Showed no acute findings to explain the patient's current lower abdominal pain.     Nalee Lightle L. Caralyn Twining, PA-C 09/05/13 2015

## 2013-09-06 NOTE — ED Provider Notes (Signed)
Medical screening examination/treatment/procedure(s) were performed by non-physician practitioner and as supervising physician I was immediately available for consultation/collaboration.   EKG Interpretation None        Kehinde Totzke L Mckennon Zwart, MD 09/06/13 1511 

## 2013-09-10 ENCOUNTER — Ambulatory Visit: Payer: Self-pay | Admitting: Urology

## 2013-09-29 ENCOUNTER — Encounter (HOSPITAL_COMMUNITY): Payer: Self-pay | Admitting: Emergency Medicine

## 2013-09-29 ENCOUNTER — Emergency Department (HOSPITAL_COMMUNITY)
Admission: EM | Admit: 2013-09-29 | Discharge: 2013-09-30 | Disposition: A | Discharge status: Home / Self Care | Payer: Self-pay | Attending: Emergency Medicine | Admitting: Emergency Medicine

## 2013-09-29 DIAGNOSIS — Z9089 Acquired absence of other organs: Secondary | ICD-10-CM | POA: Insufficient documentation

## 2013-09-29 DIAGNOSIS — G8929 Other chronic pain: Secondary | ICD-10-CM | POA: Insufficient documentation

## 2013-09-29 DIAGNOSIS — Z9889 Other specified postprocedural states: Secondary | ICD-10-CM | POA: Insufficient documentation

## 2013-09-29 DIAGNOSIS — Z8619 Personal history of other infectious and parasitic diseases: Secondary | ICD-10-CM | POA: Insufficient documentation

## 2013-09-29 DIAGNOSIS — Z8711 Personal history of peptic ulcer disease: Secondary | ICD-10-CM | POA: Insufficient documentation

## 2013-09-29 DIAGNOSIS — J449 Chronic obstructive pulmonary disease, unspecified: Secondary | ICD-10-CM | POA: Insufficient documentation

## 2013-09-29 DIAGNOSIS — Z90710 Acquired absence of both cervix and uterus: Secondary | ICD-10-CM | POA: Insufficient documentation

## 2013-09-29 DIAGNOSIS — Z8739 Personal history of other diseases of the musculoskeletal system and connective tissue: Secondary | ICD-10-CM | POA: Insufficient documentation

## 2013-09-29 DIAGNOSIS — Z8719 Personal history of other diseases of the digestive system: Secondary | ICD-10-CM | POA: Insufficient documentation

## 2013-09-29 DIAGNOSIS — R109 Unspecified abdominal pain: Secondary | ICD-10-CM | POA: Insufficient documentation

## 2013-09-29 DIAGNOSIS — F17200 Nicotine dependence, unspecified, uncomplicated: Secondary | ICD-10-CM | POA: Insufficient documentation

## 2013-09-29 DIAGNOSIS — G40909 Epilepsy, unspecified, not intractable, without status epilepticus: Secondary | ICD-10-CM | POA: Insufficient documentation

## 2013-09-29 DIAGNOSIS — I129 Hypertensive chronic kidney disease with stage 1 through stage 4 chronic kidney disease, or unspecified chronic kidney disease: Secondary | ICD-10-CM | POA: Insufficient documentation

## 2013-09-29 DIAGNOSIS — N189 Chronic kidney disease, unspecified: Secondary | ICD-10-CM | POA: Insufficient documentation

## 2013-09-29 DIAGNOSIS — F411 Generalized anxiety disorder: Secondary | ICD-10-CM | POA: Insufficient documentation

## 2013-09-29 DIAGNOSIS — F3289 Other specified depressive episodes: Secondary | ICD-10-CM | POA: Insufficient documentation

## 2013-09-29 DIAGNOSIS — Z79899 Other long term (current) drug therapy: Secondary | ICD-10-CM | POA: Insufficient documentation

## 2013-09-29 DIAGNOSIS — F329 Major depressive disorder, single episode, unspecified: Secondary | ICD-10-CM | POA: Insufficient documentation

## 2013-09-29 DIAGNOSIS — J4489 Other specified chronic obstructive pulmonary disease: Secondary | ICD-10-CM | POA: Insufficient documentation

## 2013-09-29 MED ORDER — OXYCODONE-ACETAMINOPHEN 5-325 MG PO TABS
1.0000 | ORAL_TABLET | Freq: Once | ORAL | Status: AC
  Administered 2013-09-29: 1 via ORAL
  Filled 2013-09-29: qty 1

## 2013-09-29 NOTE — Discharge Instructions (Signed)

## 2013-09-29 NOTE — ED Notes (Signed)
Pt had indwelling catheter placed today at [facility], has pain from bladder mesh sling, causing abdominal pain.

## 2013-09-29 NOTE — ED Provider Notes (Signed)
CSN: [id number]     Arrival date & time 09/29/13  1954 History  This chart was scribed for [employer]. Rubin Payor, MD by Charline Bills, ED Scribe. The patient was seen in room APA02/APA02. Patient's care was started at 10:24 PM.     Chief Complaint  Patient presents with  . Abdominal Pain   The history is provided by the patient. No language interpreter was used.   HPI Comments: Yolanda Adams is a 48 y.o. female who presents to the Emergency Department complaining of constant, lower abdominal pain. Pt suspects that the pain is from her bladder mesh sling. She states that she has contacted the physician who placed the sling but they refuse to remove it. Pt reports that she was seen at [facility] today and had a catheter placed. Pt was instructed to catheterize x4 daily. Pt states that she is returning to [facility] in a week for an examination. Pt states that she was referred to ED by the urologist.   Past Medical History  Diagnosis Date  . GERD (gastroesophageal reflux disease)   . PUD (peptic ulcer disease)   . Anxiety and depression   . DDD (degenerative disc disease)   . COPD (chronic obstructive pulmonary disease)   . HTN (hypertension)   . Shoulder pain   . IBS (irritable bowel syndrome)   . Peptic stricture of esophagus     dilated twice 10/2009  . Helicobacter pylori gastritis June 2011    treated with Pylera  . Shortness of breath     continuous  . Seizures     had 1 episode of it about 5 years ago, no other hx of seizures  . Chronic kidney disease     small rt kidney stone   Past Surgical History  Procedure Laterality Date  . Cholecystectomy    . Bladder surgery      x 3 in [place]-strected twice and had bladder sling placed  . Oophorectomy      Right  . Esophagogastroduodenoscopy  10/26/09 GASTRITIS/DUODENITIS    Distal esol stricture/dil 15-16 mm. A 17 mm dilator would not pass  . Upper gastrointestinal endoscopy  JUN 14, 2011DYSPHAGIA, EPIG PAIN    H PYLORI GASTRITIS  .  Steroid shot in back    . Savory dilation  04/15/2011    UXL:[id number] gastritis/Duodenitis  . Ileocolonoscopy  04/15/2011    SLF: Colitis in the sigmoid colon/Internal hemorrhoids  . Abdominal hysterectomy     Family History  Problem Relation Age of Onset  . Prostate cancer Father     Partial gastrectomy  . Colon cancer Neg Hx   . Anesthesia problems Neg Hx   . Hypotension Neg Hx   . Malignant hyperthermia Neg Hx   . Pseudochol deficiency Neg Hx    History  Substance Use Topics  . Smoking status: Current Every Day Smoker -- 1.00 packs/day for 31 years    Types: Cigarettes  . Smokeless tobacco: Not on file  . Alcohol Use: No   OB History   Grav Para Term Preterm Abortions TAB SAB Ect Mult Living                 Review of Systems  Gastrointestinal: Positive for abdominal pain.  All other systems reviewed and are negative.   Allergies  Flagyl; Sudafed; and Toradol  Home Medications   Prior to Admission medications   Medication Sig Start Date End Date Taking? Authorizing Provider  acetaminophen (TYLENOL EX ST ARTHRITIS PAIN) 500 MG  tablet Take 500 mg by mouth every 6 (six) hours as needed for moderate pain.     Historical Provider, MD  albuterol (PROVENTIL HFA;VENTOLIN HFA) 108 (90 BASE) MCG/ACT inhaler Inhale 2 puffs into the lungs every 6 (six) hours as needed for wheezing or shortness of breath.    Historical Provider, MD  citalopram (CELEXA) 40 MG tablet Take 40 mg by mouth daily.    Historical Provider, MD  diazepam (VALIUM) 10 MG tablet Take 10 mg by mouth 3 (three) times daily.    Historical Provider, MD  gabapentin (NEURONTIN) 300 MG capsule Take 300 mg by mouth 3 (three) times daily.     Historical Provider, MD  hydrochlorothiazide 25 MG tablet Take 12.5 mg by mouth daily.     Historical Provider, MD  omeprazole (PRILOSEC) 20 MG capsule Take 20 mg by mouth daily as needed (heartburn).     Historical Provider, MD  oxyCODONE-acetaminophen (PERCOCET/ROXICET) 5-325 MG  per tablet Take 1 tablet by mouth every 4 (four) hours as needed for severe pain. 09/02/13   Tammy L. Triplett, PA-C  traZODone (DESYREL) 100 MG tablet Take 100 mg by mouth at bedtime as needed for sleep.     Historical Provider, MD   Triage Vitals: BP 143/100  Pulse 91  Temp(Src) 97.5 F (36.4 C) (Oral)  Resp 20  Ht 5\' 2"  (1.575 m)  Wt 174 lb (78.926 kg)  BMI 31.82 kg/m2  SpO2 99%  LMP 03/30/2011 Physical Exam  Nursing note and vitals reviewed. Constitutional: She is oriented to person, place, and time. She appears well-developed and well-nourished. No distress.  HENT:  Head: Normocephalic and atraumatic.  Eyes: EOM are normal.  Neck: Neck supple.  Pulmonary/Chest: Effort normal. No respiratory distress.  Abdominal: There is tenderness.  Mild lower abdominal tenderness  Genitourinary:  Foley in place  Musculoskeletal: Normal range of motion.  Neurological: She is alert and oriented to person, place, and time.  Skin: Skin is warm and dry.  Psychiatric: She has a normal mood and affect. Her behavior is normal.    ED Course  Procedures (including critical care time) DIAGNOSTIC STUDIES: Oxygen Saturation is 99% on RA, normal by my interpretation.    COORDINATION OF CARE: 10:27 PM-Discussed treatment plan with pt at bedside and pt agreed to plan.   Labs Review Labs Reviewed - No data to display  Imaging Review No results found.   EKG Interpretation None      MDM   Final diagnoses:  Chronic pain    Patient with chronic abdominal pain. Patient is attributed to her bladder sling. She was seen at [facility] today in the urology clinic. She was given a Foley catheter. Review of the notes from there states that she was requesting pain medicines and states she wishes to go to the ER. She went to the ER there and left before being seen again here. She has multiple prior visits to the ER. This is her second visit in the last 6 months. After views of the notes she's  been told she'll not get narcotics for chronic pain. She could have some bladder spasm to the Foley and was given one Percocet orally here. She was told to not get a prescription for Percocet for home or TIA the Dilaudid that she was requesting. Patient left without discharge instructions after she got her Percocet.  I personally performed the services described in this documentation, which was scribed in my presence. The recorded information has been reviewed and is  accurate.    Juliet RudeNathan R. Rubin PayorPickering, MD 09/29/13 [phone]

## 2013-09-29 NOTE — ED Notes (Signed)
Dr Pickering at bedside 

## 2013-09-30 NOTE — ED Notes (Signed)
Pt. Left prior to receiving discharge instructions.

## 2014-09-22 ENCOUNTER — Encounter (HOSPITAL_COMMUNITY): Payer: Self-pay

## 2014-09-22 ENCOUNTER — Emergency Department (HOSPITAL_COMMUNITY)
Admission: EM | Admit: 2014-09-22 | Discharge: 2014-09-22 | Disposition: A | Discharge status: Home / Self Care | Payer: Self-pay | Attending: Emergency Medicine | Admitting: Emergency Medicine

## 2014-09-22 DIAGNOSIS — R1032 Left lower quadrant pain: Secondary | ICD-10-CM | POA: Insufficient documentation

## 2014-09-22 DIAGNOSIS — J449 Chronic obstructive pulmonary disease, unspecified: Secondary | ICD-10-CM | POA: Insufficient documentation

## 2014-09-22 DIAGNOSIS — Z87442 Personal history of urinary calculi: Secondary | ICD-10-CM | POA: Insufficient documentation

## 2014-09-22 DIAGNOSIS — R109 Unspecified abdominal pain: Secondary | ICD-10-CM

## 2014-09-22 DIAGNOSIS — N189 Chronic kidney disease, unspecified: Secondary | ICD-10-CM | POA: Insufficient documentation

## 2014-09-22 DIAGNOSIS — Z90710 Acquired absence of both cervix and uterus: Secondary | ICD-10-CM | POA: Insufficient documentation

## 2014-09-22 DIAGNOSIS — R1031 Right lower quadrant pain: Secondary | ICD-10-CM | POA: Insufficient documentation

## 2014-09-22 DIAGNOSIS — R110 Nausea: Secondary | ICD-10-CM | POA: Insufficient documentation

## 2014-09-22 DIAGNOSIS — Z720 Tobacco use: Secondary | ICD-10-CM | POA: Insufficient documentation

## 2014-09-22 DIAGNOSIS — Z8619 Personal history of other infectious and parasitic diseases: Secondary | ICD-10-CM | POA: Insufficient documentation

## 2014-09-22 DIAGNOSIS — Z79899 Other long term (current) drug therapy: Secondary | ICD-10-CM | POA: Insufficient documentation

## 2014-09-22 DIAGNOSIS — Z8711 Personal history of peptic ulcer disease: Secondary | ICD-10-CM | POA: Insufficient documentation

## 2014-09-22 DIAGNOSIS — G40909 Epilepsy, unspecified, not intractable, without status epilepticus: Secondary | ICD-10-CM | POA: Insufficient documentation

## 2014-09-22 DIAGNOSIS — Z9079 Acquired absence of other genital organ(s): Secondary | ICD-10-CM | POA: Insufficient documentation

## 2014-09-22 DIAGNOSIS — K219 Gastro-esophageal reflux disease without esophagitis: Secondary | ICD-10-CM | POA: Insufficient documentation

## 2014-09-22 DIAGNOSIS — I129 Hypertensive chronic kidney disease with stage 1 through stage 4 chronic kidney disease, or unspecified chronic kidney disease: Secondary | ICD-10-CM | POA: Insufficient documentation

## 2014-09-22 DIAGNOSIS — R197 Diarrhea, unspecified: Secondary | ICD-10-CM | POA: Insufficient documentation

## 2014-09-22 DIAGNOSIS — Z9089 Acquired absence of other organs: Secondary | ICD-10-CM | POA: Insufficient documentation

## 2014-09-22 DIAGNOSIS — Z8739 Personal history of other diseases of the musculoskeletal system and connective tissue: Secondary | ICD-10-CM | POA: Insufficient documentation

## 2014-09-22 DIAGNOSIS — G8929 Other chronic pain: Secondary | ICD-10-CM

## 2014-09-22 LAB — CBC WITH DIFFERENTIAL/PLATELET
BASOS ABS: 0 10*3/uL (ref 0.0–0.1)
Basophils Relative: 1 % (ref 0–1)
EOS ABS: 0.1 10*3/uL (ref 0.0–0.7)
Eosinophils Relative: 4 % (ref 0–5)
HCT: 40.7 % (ref 36.0–46.0)
HEMOGLOBIN: 13.5 g/dL (ref 12.0–15.0)
Lymphocytes Relative: 20 % (ref 12–46)
Lymphs Abs: 0.7 10*3/uL (ref 0.7–4.0)
MCH: 32.5 pg (ref 26.0–34.0)
MCHC: 33.2 g/dL (ref 30.0–36.0)
MCV: 98.1 fL (ref 78.0–100.0)
Monocytes Absolute: 0.5 10*3/uL (ref 0.1–1.0)
Monocytes Relative: 13 % — ABNORMAL HIGH (ref 3–12)
NEUTROS ABS: 2.2 10*3/uL (ref 1.7–7.7)
NEUTROS PCT: 62 % (ref 43–77)
Platelets: 176 10*3/uL (ref 150–400)
RBC: 4.15 MIL/uL (ref 3.87–5.11)
RDW: 12.4 % (ref 11.5–15.5)
WBC: 3.5 10*3/uL — AB (ref 4.0–10.5)

## 2014-09-22 LAB — HEPATIC FUNCTION PANEL
ALT: 16 U/L (ref 14–54)
AST: 17 U/L (ref 15–41)
Albumin: 3.8 g/dL (ref 3.5–5.0)
Alkaline Phosphatase: 90 U/L (ref 38–126)
Bilirubin, Direct: 0.1 mg/dL (ref 0.1–0.5)
Indirect Bilirubin: 0.2 mg/dL — ABNORMAL LOW (ref 0.3–0.9)
Total Bilirubin: 0.3 mg/dL (ref 0.3–1.2)
Total Protein: 6.9 g/dL (ref 6.5–8.1)

## 2014-09-22 LAB — URINALYSIS, ROUTINE W REFLEX MICROSCOPIC
Bilirubin Urine: NEGATIVE
Glucose, UA: NEGATIVE mg/dL
Hgb urine dipstick: NEGATIVE
KETONES UR: NEGATIVE mg/dL
LEUKOCYTES UA: NEGATIVE
Nitrite: NEGATIVE
PROTEIN: NEGATIVE mg/dL
Specific Gravity, Urine: 1.025 (ref 1.005–1.030)
UROBILINOGEN UA: 0.2 mg/dL (ref 0.0–1.0)
pH: 5.5 (ref 5.0–8.0)

## 2014-09-22 LAB — BASIC METABOLIC PANEL
Anion gap: 6 (ref 5–15)
BUN: 11 mg/dL (ref 6–20)
CHLORIDE: 109 mmol/L (ref 101–111)
CO2: 26 mmol/L (ref 22–32)
CREATININE: 0.96 mg/dL (ref 0.44–1.00)
Calcium: 8.8 mg/dL — ABNORMAL LOW (ref 8.9–10.3)
GFR calc Af Amer: >60 mL/min (ref >60)
GFR calc non Af Amer: >60 mL/min (ref >60)
Glucose, Bld: 99 mg/dL (ref 65–99)
Potassium: 4.3 mmol/L (ref 3.5–5.1)
Sodium: 141 mmol/L (ref 135–145)

## 2014-09-22 LAB — LIPASE, BLOOD: LIPASE: 72 U/L — AB (ref 22–51)

## 2014-09-22 MED ORDER — ETODOLAC 500 MG PO TABS: 500.0000 mg | ORAL_TABLET | Freq: Two times a day (BID) | ORAL | Status: DC

## 2014-09-22 MED ORDER — DIPHENHYDRAMINE HCL 50 MG/ML IJ SOLN
12.5000 mg | Freq: Once | INTRAMUSCULAR | Status: AC
  Administered 2014-09-22: 12.5 mg via INTRAVENOUS

## 2014-09-22 MED ORDER — DIPHENHYDRAMINE HCL 50 MG/ML IJ SOLN
INTRAMUSCULAR | Status: AC
  Filled 2014-09-22: qty 1

## 2014-09-22 MED ORDER — ONDANSETRON HCL 4 MG/2ML IJ SOLN
4.0000 mg | Freq: Once | INTRAMUSCULAR | Status: AC
  Administered 2014-09-22: 4 mg via INTRAVENOUS
  Filled 2014-09-22: qty 2

## 2014-09-22 MED ORDER — MORPHINE SULFATE 4 MG/ML IJ SOLN
4.0000 mg | Freq: Once | INTRAMUSCULAR | Status: AC
  Administered 2014-09-22: 4 mg via INTRAVENOUS
  Filled 2014-09-22: qty 1

## 2014-09-22 NOTE — ED Notes (Signed)
Patient constantly ringing call bell, patient has called out around 6-8 times, asking for Dilaudid

## 2014-09-22 NOTE — ED Notes (Signed)
Discharge instructions reviewed. Patient displeased that she was receiving Lodine instead of "perocet or something like that". Patient states [facility]   Rash on patients right arm has improved drastically with no whelps.

## 2014-09-22 NOTE — Discharge Instructions (Signed)

## 2014-09-22 NOTE — ED Notes (Signed)
Pt c/o pain in lower abd and lower back since Tuesday.  Has been taking ibuprofen and tylenol but no relief.  Reports diarrhea and nausea.  Reports burning with urination and malodor.

## 2014-09-22 NOTE — ED Provider Notes (Signed)
CSN: [id number]     Arrival date & time 09/22/14  1018 History   First MD Initiated Contact with Patient 09/22/14 1058     Chief Complaint  Patient presents with  . Back Pain  . Abdominal Pain     (Consider location/radiation/quality/duration/timing/severity/associated sxs/prior Treatment) HPI   Yolanda Adams is a 49 y.o. female who presents to the Emergency Department complaining of recurrent lower abdominal pain for two days and burning with urination.  She states that her urine has a odor and also had few episodes of loose stools and nausea.  Pain feels similar to previous.  She states that she has a "mesh bladder sling" that was placed years ago and she is waiting for an [facility]. She states the sling causes her to have ongoing pain, but she is concerned that she has a UTI.   She denies vomiting, vaginal bleeding or discharge, fever, chills.  She states the pain is sharp and constant.     Past Medical History  Diagnosis Date  . GERD (gastroesophageal reflux disease)   . PUD (peptic ulcer disease)   . Anxiety and depression   . DDD (degenerative disc disease)   . COPD (chronic obstructive pulmonary disease)   . HTN (hypertension)   . Shoulder pain   . IBS (irritable bowel syndrome)   . Peptic stricture of esophagus     dilated twice 10/2009  . Helicobacter pylori gastritis June 2011    treated with Pylera  . Shortness of breath     continuous  . Seizures     had 1 episode of it about 5 years ago, no other hx of seizures  . Chronic kidney disease     small rt kidney stone   Past Surgical History  Procedure Laterality Date  . Cholecystectomy    . Bladder surgery      x 3 in [place]-strected twice and had bladder sling placed  . Oophorectomy      Right  . Esophagogastroduodenoscopy  10/26/09 GASTRITIS/DUODENITIS    Distal esol stricture/dil 15-16 mm. A 17 mm dilator would not pass  . Upper gastrointestinal endoscopy  JUN 14,  2011DYSPHAGIA, EPIG PAIN    H PYLORI GASTRITIS  . Steroid shot in back    . Savory dilation  04/15/2011    NWG:[id number]:Moderate gastritis/Duodenitis  . Ileocolonoscopy  04/15/2011    SLF: Colitis in the sigmoid colon/Internal hemorrhoids  . Abdominal hysterectomy     Family History  Problem Relation Age of Onset  . Prostate cancer Father     Partial gastrectomy  . Colon cancer Neg Hx   . Anesthesia problems Neg Hx   . Hypotension Neg Hx   . Malignant hyperthermia Neg Hx   . Pseudochol deficiency Neg Hx    History  Substance Use Topics  . Smoking status: Current Every Day Smoker -- 1.00 packs/day for 31 years    Types: Cigarettes  . Smokeless tobacco: Not on file  . Alcohol Use: No   OB History    No data available     Review of Systems  Constitutional: Negative for fever, chills and appetite change.  Respiratory: Negative for chest tightness and shortness of breath.   Cardiovascular: Negative for chest pain.  Gastrointestinal: Positive for nausea, abdominal pain and diarrhea. Negative for vomiting and blood in stool.  Genitourinary: Positive for dysuria. Negative for frequency, flank pain, decreased urine volume, difficulty urinating, genital sores, menstrual problem and  pelvic pain.  Musculoskeletal: Negative for back pain.  Skin: Negative for color change and rash.  Neurological: Negative for dizziness, weakness and numbness.  Hematological: Negative for adenopathy.  All other systems reviewed and are negative.     Allergies  Flagyl; Sudafed; and Toradol  Home Medications   Prior to Admission medications   Medication Sig Start Date End Date Taking? Authorizing Provider  acetaminophen (TYLENOL EX ST ARTHRITIS PAIN) 500 MG tablet Take 500 mg by mouth every 6 (six) hours as needed for moderate pain.    Yes Historical Provider, MD  albuterol (PROVENTIL HFA;VENTOLIN HFA) 108 (90 BASE) MCG/ACT inhaler Inhale 2 puffs into the lungs 4 (four) times daily.    Yes Historical  Provider, MD  citalopram (CELEXA) 40 MG tablet Take 40 mg by mouth daily.   Yes Historical Provider, MD  diazepam (VALIUM) 10 MG tablet Take 10 mg by mouth 3 (three) times daily.   Yes Historical Provider, MD  gabapentin (NEURONTIN) 400 MG capsule Take 400 mg by mouth 3 (three) times daily.   Yes Historical Provider, MD  ibuprofen (ADVIL,MOTRIN) 200 MG tablet Take 400 mg by mouth every 6 (six) hours as needed for moderate pain.    Yes Historical Provider, MD  IRON PO Take 1 tablet by mouth daily as needed (low iron).   Yes Historical Provider, MD  omeprazole (PRILOSEC OTC) 20 MG tablet Take 20 mg by mouth daily as needed (acid reflux).   Yes Historical Provider, MD  ranitidine (ZANTAC) 150 MG tablet Take 150 mg by mouth daily as needed for heartburn.   Yes Historical Provider, MD  traZODone (DESYREL) 50 MG tablet Take 50 mg by mouth at bedtime as needed for sleep.   Yes Historical Provider, MD   BP 102/53 mmHg  Pulse 64  Temp(Src) 98.6 F (37 C) (Oral)  Resp 20  Ht  (1.575 m)  Wt 161 lb (73.029 kg)  BMI 29.44 kg/m2  SpO2 100%  LMP 03/30/2011 Physical Exam  Constitutional: She is oriented to person, place, and time. She appears well-developed and well-nourished. No distress.  HENT:  Head: Normocephalic and atraumatic.  Mouth/Throat: Oropharynx is clear and moist.  Cardiovascular: Normal rate, regular rhythm, normal heart sounds and intact distal pulses.   No murmur heard. Pulmonary/Chest: Effort normal and breath sounds normal. No respiratory distress.  Abdominal: Soft. Normal appearance and bowel sounds are normal. She exhibits no distension. There is tenderness in the right lower quadrant and left lower quadrant. There is no rigidity, no rebound, no guarding, no CVA tenderness and no tenderness at McBurney's point. No hernia.    Musculoskeletal: Normal range of motion.  Neurological: She is alert and oriented to person, place, and time. She exhibits normal muscle tone.  Coordination normal.  Skin: Skin is warm and dry.  Nursing note and vitals reviewed.   ED Course  Procedures (including critical care time) Labs Review Labs Reviewed  CBC WITH DIFFERENTIAL/PLATELET - Abnormal; Notable for the following:    WBC 3.5 (*)    Monocytes Relative 13 (*)    All other components within normal limits  BASIC METABOLIC PANEL - Abnormal; Notable for the following:    Calcium 8.8 (*)    All other components within normal limits  LIPASE, BLOOD - Abnormal; Notable for the following:    Lipase 72 (*)    All other components within normal limits  HEPATIC FUNCTION PANEL - Abnormal; Notable for the following:    Indirect Bilirubin 0.2 (*)  All other components within normal limits  URINALYSIS, ROUTINE W REFLEX MICROSCOPIC    Imaging Review No results found.   EKG Interpretation None      MDM   Final diagnoses:  Chronic abdominal pain    Pt had localized rxn at IV site after the morphine.  Resolved after IV benadryl.  No CP, dyspnea or hives.    Pt has called out to nursing multiple times requesting dilaudid by name.  Labs are reassuring.  Abdominal exam is non-focal. She had a CT scan of abd in 11/14 without acute findings.  Hx of chronic lower abdominal pain.  Advised that narcotics are not indicated for chronic pain. Discussed pt care and plan with Dr. Fayrene FearingJames prior to d/c.  Vitals stable and pt appears stable for d/c.  Clinical concern for acute abdomen is low, but pt given restrict return precautions.   Pauline Ausammy Felissa Blouch, PA-C 09/24/14 1618  Rolland PorterMark James, MD 10/01/14 [phone]

## 2014-09-22 NOTE — ED Notes (Signed)
In to administration medications. Explained to patient that she had Morphine ordered for pain. Patient states that "doesnt help" and she "needs Dilaudid".

## 2014-09-22 NOTE — ED Notes (Addendum)
Called to bedside by tech, patient arm has broken out into a red rash with a few whelps. No breathing issues, non itching. Patient's main complaint continues to be pain and she is stating that the Morphine did not work, she "needs Dilaudid". Dr. Fayrene FearingJames informed of rash, and Benadryl given per his orders.

## 2014-10-17 ENCOUNTER — Encounter (HOSPITAL_COMMUNITY): Payer: Self-pay | Admitting: Emergency Medicine

## 2014-10-17 ENCOUNTER — Emergency Department (HOSPITAL_COMMUNITY): Payer: Self-pay

## 2014-10-17 ENCOUNTER — Emergency Department (HOSPITAL_COMMUNITY)
Admission: EM | Admit: 2014-10-17 | Discharge: 2014-10-17 | Disposition: A | Discharge status: Home / Self Care | Payer: Self-pay | Attending: Emergency Medicine | Admitting: Emergency Medicine

## 2014-10-17 DIAGNOSIS — J449 Chronic obstructive pulmonary disease, unspecified: Secondary | ICD-10-CM | POA: Insufficient documentation

## 2014-10-17 DIAGNOSIS — Y9289 Other specified places as the place of occurrence of the external cause: Secondary | ICD-10-CM | POA: Insufficient documentation

## 2014-10-17 DIAGNOSIS — S42252A Displaced fracture of greater tuberosity of left humerus, initial encounter for closed fracture: Secondary | ICD-10-CM | POA: Insufficient documentation

## 2014-10-17 DIAGNOSIS — Z79899 Other long term (current) drug therapy: Secondary | ICD-10-CM | POA: Insufficient documentation

## 2014-10-17 DIAGNOSIS — S4292XA Fracture of left shoulder girdle, part unspecified, initial encounter for closed fracture: Secondary | ICD-10-CM

## 2014-10-17 DIAGNOSIS — F329 Major depressive disorder, single episode, unspecified: Secondary | ICD-10-CM | POA: Insufficient documentation

## 2014-10-17 DIAGNOSIS — I129 Hypertensive chronic kidney disease with stage 1 through stage 4 chronic kidney disease, or unspecified chronic kidney disease: Secondary | ICD-10-CM | POA: Insufficient documentation

## 2014-10-17 DIAGNOSIS — T07XXXA Unspecified multiple injuries, initial encounter: Secondary | ICD-10-CM

## 2014-10-17 DIAGNOSIS — W1789XA Other fall from one level to another, initial encounter: Secondary | ICD-10-CM | POA: Insufficient documentation

## 2014-10-17 DIAGNOSIS — S300XXA Contusion of lower back and pelvis, initial encounter: Secondary | ICD-10-CM | POA: Insufficient documentation

## 2014-10-17 DIAGNOSIS — Y998 Other external cause status: Secondary | ICD-10-CM | POA: Insufficient documentation

## 2014-10-17 DIAGNOSIS — Z8619 Personal history of other infectious and parasitic diseases: Secondary | ICD-10-CM | POA: Insufficient documentation

## 2014-10-17 DIAGNOSIS — N189 Chronic kidney disease, unspecified: Secondary | ICD-10-CM | POA: Insufficient documentation

## 2014-10-17 DIAGNOSIS — K219 Gastro-esophageal reflux disease without esophagitis: Secondary | ICD-10-CM | POA: Insufficient documentation

## 2014-10-17 DIAGNOSIS — S8002XA Contusion of left knee, initial encounter: Secondary | ICD-10-CM | POA: Insufficient documentation

## 2014-10-17 DIAGNOSIS — Y9389 Activity, other specified: Secondary | ICD-10-CM | POA: Insufficient documentation

## 2014-10-17 DIAGNOSIS — F419 Anxiety disorder, unspecified: Secondary | ICD-10-CM | POA: Insufficient documentation

## 2014-10-17 DIAGNOSIS — Z720 Tobacco use: Secondary | ICD-10-CM | POA: Insufficient documentation

## 2014-10-17 DIAGNOSIS — Z8711 Personal history of peptic ulcer disease: Secondary | ICD-10-CM | POA: Insufficient documentation

## 2014-10-17 DIAGNOSIS — S8010XA Contusion of unspecified lower leg, initial encounter: Secondary | ICD-10-CM | POA: Insufficient documentation

## 2014-10-17 DIAGNOSIS — S20219A Contusion of unspecified front wall of thorax, initial encounter: Secondary | ICD-10-CM | POA: Insufficient documentation

## 2014-10-17 MED ORDER — HYDROMORPHONE HCL 1 MG/ML IJ SOLN
1.0000 mg | Freq: Once | INTRAMUSCULAR | Status: AC
Start: 2014-10-17 — End: 2014-10-17
  Administered 2014-10-17: 1 mg via INTRAVENOUS
  Filled 2014-10-17: qty 1

## 2014-10-17 MED ORDER — OXYCODONE-ACETAMINOPHEN 5-325 MG PO TABS: 1.0000 | ORAL_TABLET | Freq: Four times a day (QID) | ORAL | Status: DC | PRN

## 2014-10-17 MED ORDER — ONDANSETRON HCL 4 MG/2ML IJ SOLN
4.0000 mg | Freq: Once | INTRAMUSCULAR | Status: AC
  Administered 2014-10-17: 4 mg via INTRAMUSCULAR
  Filled 2014-10-17: qty 2

## 2014-10-17 MED ORDER — OXYCODONE-ACETAMINOPHEN 5-325 MG PO TABS
1.0000 | ORAL_TABLET | Freq: Once | ORAL | Status: AC
Start: 2014-10-17 — End: 2014-10-17
  Administered 2014-10-17: 1 via ORAL
  Filled 2014-10-17: qty 1

## 2014-10-17 NOTE — ED Provider Notes (Signed)
CSN: [id number]     Arrival date & time 10/17/14  1810 History  This chart was scribed for Ivery Quale, PA-C, working with Layla Maw Ward, DO by Octavia Heir, ED Scribe. This patient was seen in room APFT24/APFT24 and the patient's care was started at 7:20 PM.    Chief Complaint  Patient presents with  . Shoulder Pain      The history is provided by the patient. No language interpreter was used.    HPI Comments: Yolanda Adams is a 49 y.o. female who presents to the Emergency Department complaining of a constant gradual worsening left shoulder pain onset yesterday. She has associated bruising on the left side of her body. Pt notes she fell yesterday after standing on a tool box and when she fell her left should landed on the toolbox. Pt was given some pain pills from her father. Pt reports having a dislocated shoulder but is unsure which shoulder it was. She denies any surgeries. Pt denies hematuria.  Past Medical History  Diagnosis Date  . GERD (gastroesophageal reflux disease)   . PUD (peptic ulcer disease)   . Anxiety and depression   . DDD (degenerative disc disease)   . COPD (chronic obstructive pulmonary disease)   . HTN (hypertension)   . Shoulder pain   . IBS (irritable bowel syndrome)   . Peptic stricture of esophagus     dilated twice 10/2009  . Helicobacter pylori gastritis June 2011    treated with Pylera  . Shortness of breath     continuous  . Seizures     had 1 episode of it about 5 years ago, no other hx of seizures  . Chronic kidney disease     small rt kidney stone   Past Surgical History  Procedure Laterality Date  . Cholecystectomy    . Bladder surgery      x 3 in [place]-strected twice and had bladder sling placed  . Oophorectomy      Right  . Esophagogastroduodenoscopy  10/26/09 GASTRITIS/DUODENITIS    Distal esol stricture/dil 15-16 mm. A 17 mm dilator would not pass  . Upper gastrointestinal endoscopy  JUN 14, 2011DYSPHAGIA, EPIG PAIN    H PYLORI  GASTRITIS  . Steroid shot in back    . Savory dilation  04/15/2011    WUJ:[id number] gastritis/Duodenitis  . Ileocolonoscopy  04/15/2011    SLF: Colitis in the sigmoid colon/Internal hemorrhoids  . Abdominal hysterectomy     Family History  Problem Relation Age of Onset  . Prostate cancer Father     Partial gastrectomy  . Colon cancer Neg Hx   . Anesthesia problems Neg Hx   . Hypotension Neg Hx   . Malignant hyperthermia Neg Hx   . Pseudochol deficiency Neg Hx    History  Substance Use Topics  . Smoking status: Current Every Day Smoker -- 1.00 packs/day for 31 years    Types: Cigarettes  . Smokeless tobacco: Not on file  . Alcohol Use: No   OB History    No data available     Review of Systems  Genitourinary: Negative for hematuria.  Musculoskeletal: Positive for arthralgias.  Skin: Positive for color change.  All other systems reviewed and are negative.     Allergies  Flagyl; Sudafed; and Toradol  Home Medications   Prior to Admission medications   Medication Sig Start Date End Date Taking? Authorizing Provider  acetaminophen (TYLENOL EX ST ARTHRITIS PAIN) 500 MG tablet Take 500 mg by  mouth every 6 (six) hours as needed for moderate pain.     Historical Provider, MD  albuterol (PROVENTIL HFA;VENTOLIN HFA) 108 (90 BASE) MCG/ACT inhaler Inhale 2 puffs into the lungs 4 (four) times daily.     Historical Provider, MD  citalopram (CELEXA) 40 MG tablet Take 40 mg by mouth daily.    Historical Provider, MD  diazepam (VALIUM) 10 MG tablet Take 10 mg by mouth 3 (three) times daily.    Historical Provider, MD  etodolac (LODINE) 500 MG tablet Take 1 tablet (500 mg total) by mouth 2 (two) times daily. Take with food 09/22/14   Tammy Triplett, PA-C  gabapentin (NEURONTIN) 400 MG capsule Take 400 mg by mouth 3 (three) times daily.    Historical Provider, MD  ibuprofen (ADVIL,MOTRIN) 200 MG tablet Take 400 mg by mouth every 6 (six) hours as needed for moderate pain.     Historical  Provider, MD  IRON PO Take 1 tablet by mouth daily as needed (low iron).    Historical Provider, MD  omeprazole (PRILOSEC OTC) 20 MG tablet Take 20 mg by mouth daily as needed (acid reflux).    Historical Provider, MD  ranitidine (ZANTAC) 150 MG tablet Take 150 mg by mouth daily as needed for heartburn.    Historical Provider, MD  traZODone (DESYREL) 50 MG tablet Take 50 mg by mouth at bedtime as needed for sleep.    Historical Provider, MD   Triage vitals: BP 129/98 mmHg  Pulse 75  Temp(Src) 97.7 F (36.5 C) (Oral)  Resp 24  Ht 5\' 2"  (1.575 m)  Wt 160 lb (72.576 kg)  BMI 29.26 kg/m2  SpO2 96%  LMP 03/30/2011 Physical Exam  Constitutional: She is oriented to person, place, and time. She appears well-developed and well-nourished. No distress.  HENT:  Head: Normocephalic and atraumatic.  Mouth/Throat: Oropharynx is clear and moist. No oropharyngeal exudate.  Eyes: Conjunctivae and EOM are normal. Pupils are equal, round, and reactive to light.  Neck: Normal range of motion. Neck supple.  Cardiovascular: Normal rate, regular rhythm, normal heart sounds and intact distal pulses.   No murmur heard. Pulmonary/Chest: Effort normal and breath sounds normal. No respiratory distress.  Abdominal: Soft. There is no tenderness. There is no rebound and no guarding.  Musculoskeletal: Normal range of motion. She exhibits no edema or tenderness.  No deformity of the clavicle No deformity of the sternum Tenderness to palpation of the left shoulder Pain to palpation of the upper humerus No deformity of the radial or humeral area Bruise in the mid rib area Pain over the lower rib area Pain at the posterior left lower rib area No deformity of the scapula Bruise of the left breast No hematoma noted No bleeding or drainage from the nipple area No visible deformity of the left shoulder Bruise of the pelvis Pain to palpation of the left hip and buttocks Bruise to the lateral left knee and later  tibial area  Neurological: She is alert and oriented to person, place, and time. No cranial nerve deficit. She exhibits normal muscle tone. Coordination normal.  Grip is symmetrical  Full ROM of right and left wrist  Skin: Skin is warm.  Psychiatric: She has a normal mood and affect. Her behavior is normal.  Nursing note and vitals reviewed.   ED Course  Procedures  DIAGNOSTIC STUDIES: Oxygen Saturation is 96% on RA, adequate by my interpretation.  COORDINATION OF CARE:  7:30 PM Discussed treatment plan which includes pain medication, x-rays of  pelvis and ribs with pt at bedside and pt agreed to plan.   Labs Review Labs Reviewed - No data to display  Imaging Review No results found.   EKG Interpretation None      MDM  Vital signs stable. Xray of lthe left ribs  And chest - neg for fx or pneumothorax.Xray of the pelvis and left shoulder negative for fx. Exam suggest multiple bruised and abrasions. Pt speaks in complete sentences. She is ambulatory. Rx for percocet 1 po q6h given to the patient. Pt also given ice pack. Pt to follow up with PCP.   Final diagnoses:  None    **I have reviewed nursing notes, vital signs, and all appropriate lab and imaging results for this patient.*  **I personally performed the services described in this documentation, which was scribed in my presence. The recorded information has been reviewed and is accurate.*    Ivery Quale, PA-C 10/18/14 2004  Kristen N Ward, DO 10/18/14 2118

## 2014-10-17 NOTE — ED Notes (Signed)
Patient states she fell yesterday and is complaining of left shoulder pain radiating down left side. Patient states it hurts to move her arm.

## 2014-10-17 NOTE — ED Notes (Signed)
Pt verbalized understanding of no driving and to use caution within 4 hours of taking pain meds due to meds cause drowsiness 

## 2014-10-17 NOTE — Discharge Instructions (Signed)
You have a broken shoulder. Please see Dr. Hilda Lias tomorrow morning at 9 AM in the office. Please leave your shoulder immobilizer in place. Please use ice for 15-20 minute intervals. Use Tylenol for mild pain, use Percocet for more severe pain. Shoulder Fracture You have a fractured humerus (bone in the upper arm) at the shoulder just below the ball of the shoulder joint. Most of the time the bones of a broken shoulder are in an acceptable position. Usually the injury can be treated with a shoulder immobilizer or sling and swath bandage. These devices support the arm and prevent any shoulder movement. If the bones are not in a good position, then surgery is sometimes needed. Shoulder fractures usually cause swelling, pain, and discoloration around the upper arm initially. They heal in 8-12 weeks with proper treatment. Rest in bed or a reclining chair as long as your shoulder is very painful. Sitting up generally results in less pain at the fracture site. Do not remove your shoulder bandage until your caregiver approves. You may apply ice packs over the shoulder for 20-30 minutes every 2 hours for the next 2-3 days to reduce the pain and swelling. Use your pain medicine as prescribed.  SEEK IMMEDIATE MEDICAL CARE IF:  You develop severe shoulder pain unrelieved by rest and taking pain medicine.  You have pain, numbness, tingling, or weakness in the hand or wrist.  You develop shortness of breath, chest pain, severe weakness, or fainting.  You have severe pain with motion of the fingers or wrist. MAKE SURE YOU:   Understand these instructions.  Will watch your condition.  Will get help right away if you are not doing well or get worse. Document Released: 05/30/2004 Document Revised: 07/15/2011 Document Reviewed: 08/10/2008 [facility] Patient Information 2015 [place], [place]. This information is not intended to replace advice given to you by your health care provider. Make sure you discuss any  questions you have with your health care provider.  Contusion A contusion is a deep bruise. Contusions happen when an injury causes bleeding under the skin. Signs of bruising include pain, puffiness (swelling), and discolored skin. The contusion may turn blue, purple, or yellow. HOME CARE   Put ice on the injured area.  Put ice in a plastic bag.  Place a towel between your skin and the bag.  Leave the ice on for 15-20 minutes, 03-04 times a day.  Only take medicine as told by your doctor.  Rest the injured area.  If possible, raise (elevate) the injured area to lessen puffiness. GET HELP RIGHT AWAY IF:   You have more bruising or puffiness.  You have pain that is getting worse.  Your puffiness or pain is not helped by medicine. MAKE SURE YOU:   Understand these instructions.  Will watch your condition.  Will get help right away if you are not doing well or get worse. Document Released: 10/09/2007 Document Revised: 07/15/2011 Document Reviewed: 02/25/2011 [facility] Patient Information 2015 [place], [place]. This information is not intended to replace advice given to you by your health care provider. Make sure you discuss any questions you have with your health care provider.

## 2014-11-01 ENCOUNTER — Other Ambulatory Visit (HOSPITAL_COMMUNITY): Payer: Self-pay | Admitting: Orthopaedic Surgery

## 2014-11-01 ENCOUNTER — Ambulatory Visit (HOSPITAL_COMMUNITY)
Admission: RE | Admit: 2014-11-01 | Discharge: 2014-11-01 | Disposition: A | Discharge status: Home / Self Care | Payer: Self-pay | Source: Ambulatory Visit | Attending: Orthopaedic Surgery | Admitting: Orthopaedic Surgery

## 2014-11-01 DIAGNOSIS — T148XXA Other injury of unspecified body region, initial encounter: Secondary | ICD-10-CM

## 2014-11-01 DIAGNOSIS — W19XXXD Unspecified fall, subsequent encounter: Secondary | ICD-10-CM | POA: Insufficient documentation

## 2014-11-01 DIAGNOSIS — S42252D Displaced fracture of greater tuberosity of left humerus, subsequent encounter for fracture with routine healing: Secondary | ICD-10-CM | POA: Insufficient documentation

## 2014-11-27 ENCOUNTER — Encounter (HOSPITAL_COMMUNITY): Payer: Self-pay

## 2014-11-27 ENCOUNTER — Emergency Department (HOSPITAL_COMMUNITY)
Admission: EM | Admit: 2014-11-27 | Discharge: 2014-11-28 | Disposition: A | Discharge status: Home / Self Care | Payer: Self-pay | Attending: Emergency Medicine | Admitting: Emergency Medicine

## 2014-11-27 ENCOUNTER — Emergency Department (HOSPITAL_COMMUNITY): Payer: Self-pay

## 2014-11-27 DIAGNOSIS — F329 Major depressive disorder, single episode, unspecified: Secondary | ICD-10-CM | POA: Insufficient documentation

## 2014-11-27 DIAGNOSIS — S8011XA Contusion of right lower leg, initial encounter: Secondary | ICD-10-CM | POA: Insufficient documentation

## 2014-11-27 DIAGNOSIS — Z8619 Personal history of other infectious and parasitic diseases: Secondary | ICD-10-CM | POA: Insufficient documentation

## 2014-11-27 DIAGNOSIS — Z8719 Personal history of other diseases of the digestive system: Secondary | ICD-10-CM | POA: Insufficient documentation

## 2014-11-27 DIAGNOSIS — S60519A Abrasion of unspecified hand, initial encounter: Secondary | ICD-10-CM | POA: Insufficient documentation

## 2014-11-27 DIAGNOSIS — S20219A Contusion of unspecified front wall of thorax, initial encounter: Secondary | ICD-10-CM

## 2014-11-27 DIAGNOSIS — S3993XA Unspecified injury of pelvis, initial encounter: Secondary | ICD-10-CM | POA: Insufficient documentation

## 2014-11-27 DIAGNOSIS — S161XXA Strain of muscle, fascia and tendon at neck level, initial encounter: Secondary | ICD-10-CM | POA: Insufficient documentation

## 2014-11-27 DIAGNOSIS — Z720 Tobacco use: Secondary | ICD-10-CM | POA: Insufficient documentation

## 2014-11-27 DIAGNOSIS — Y9389 Activity, other specified: Secondary | ICD-10-CM | POA: Insufficient documentation

## 2014-11-27 DIAGNOSIS — S8012XA Contusion of left lower leg, initial encounter: Secondary | ICD-10-CM | POA: Insufficient documentation

## 2014-11-27 DIAGNOSIS — N189 Chronic kidney disease, unspecified: Secondary | ICD-10-CM | POA: Insufficient documentation

## 2014-11-27 DIAGNOSIS — S299XXA Unspecified injury of thorax, initial encounter: Secondary | ICD-10-CM | POA: Insufficient documentation

## 2014-11-27 DIAGNOSIS — Y999 Unspecified external cause status: Secondary | ICD-10-CM | POA: Insufficient documentation

## 2014-11-27 DIAGNOSIS — S60222A Contusion of left hand, initial encounter: Secondary | ICD-10-CM | POA: Insufficient documentation

## 2014-11-27 DIAGNOSIS — I129 Hypertensive chronic kidney disease with stage 1 through stage 4 chronic kidney disease, or unspecified chronic kidney disease: Secondary | ICD-10-CM | POA: Insufficient documentation

## 2014-11-27 DIAGNOSIS — J449 Chronic obstructive pulmonary disease, unspecified: Secondary | ICD-10-CM | POA: Insufficient documentation

## 2014-11-27 DIAGNOSIS — G40909 Epilepsy, unspecified, not intractable, without status epilepticus: Secondary | ICD-10-CM | POA: Insufficient documentation

## 2014-11-27 DIAGNOSIS — S40022A Contusion of left upper arm, initial encounter: Secondary | ICD-10-CM | POA: Insufficient documentation

## 2014-11-27 DIAGNOSIS — F419 Anxiety disorder, unspecified: Secondary | ICD-10-CM | POA: Insufficient documentation

## 2014-11-27 DIAGNOSIS — S20211A Contusion of right front wall of thorax, initial encounter: Secondary | ICD-10-CM | POA: Insufficient documentation

## 2014-11-27 DIAGNOSIS — Z79899 Other long term (current) drug therapy: Secondary | ICD-10-CM | POA: Insufficient documentation

## 2014-11-27 DIAGNOSIS — S4992XA Unspecified injury of left shoulder and upper arm, initial encounter: Secondary | ICD-10-CM | POA: Insufficient documentation

## 2014-11-27 DIAGNOSIS — S60221A Contusion of right hand, initial encounter: Secondary | ICD-10-CM | POA: Insufficient documentation

## 2014-11-27 DIAGNOSIS — Y92410 Unspecified street and highway as the place of occurrence of the external cause: Secondary | ICD-10-CM | POA: Insufficient documentation

## 2014-11-27 DIAGNOSIS — S20212A Contusion of left front wall of thorax, initial encounter: Secondary | ICD-10-CM | POA: Insufficient documentation

## 2014-11-27 DIAGNOSIS — S3991XA Unspecified injury of abdomen, initial encounter: Secondary | ICD-10-CM | POA: Insufficient documentation

## 2014-11-27 HISTORY — DX: Disorder of kidney and ureter, unspecified: N28.9

## 2014-11-27 LAB — CBC
HCT: 41.9 % (ref 36.0–46.0)
HEMOGLOBIN: 14.4 g/dL (ref 12.0–15.0)
MCH: 33.3 pg (ref 26.0–34.0)
MCHC: 34.4 g/dL (ref 30.0–36.0)
MCV: 97 fL (ref 78.0–100.0)
Platelets: 249 10*3/uL (ref 150–400)
RBC: 4.32 MIL/uL (ref 3.87–5.11)
RDW: 12.1 % (ref 11.5–15.5)
WBC: 11.4 10*3/uL — ABNORMAL HIGH (ref 4.0–10.5)

## 2014-11-27 LAB — COMPREHENSIVE METABOLIC PANEL
ALBUMIN: 3.7 g/dL (ref 3.5–5.0)
ALK PHOS: 98 U/L (ref 38–126)
ALT: 23 U/L (ref 14–54)
AST: 33 U/L (ref 15–41)
Anion gap: 9 (ref 5–15)
BUN: 8 mg/dL (ref 6–20)
CO2: 24 mmol/L (ref 22–32)
Calcium: 8.6 mg/dL — ABNORMAL LOW (ref 8.9–10.3)
Chloride: 103 mmol/L (ref 101–111)
Creatinine, Ser: 0.85 mg/dL (ref 0.44–1.00)
GFR calc Af Amer: >60 mL/min (ref >60)
GFR calc non Af Amer: >60 mL/min (ref >60)
GLUCOSE: 108 mg/dL — AB (ref 65–99)
Potassium: 3.2 mmol/L — ABNORMAL LOW (ref 3.5–5.1)
SODIUM: 136 mmol/L (ref 135–145)
TOTAL PROTEIN: 6.9 g/dL (ref 6.5–8.1)
Total Bilirubin: 0.3 mg/dL (ref 0.3–1.2)

## 2014-11-27 LAB — PROTIME-INR
INR: 1.18 (ref 0.00–1.49)
Prothrombin Time: 15.2 seconds (ref 11.6–15.2)

## 2014-11-27 LAB — SAMPLE TO BLOOD BANK

## 2014-11-27 LAB — ETHANOL: Alcohol, Ethyl (B): 123 mg/dL — ABNORMAL HIGH (ref <5)

## 2014-11-27 LAB — TROPONIN I: Troponin I: <0.03 ng/mL (ref <0.031)

## 2014-11-27 MED ORDER — IOHEXOL 300 MG/ML  SOLN
80.0000 mL | Freq: Once | INTRAMUSCULAR | Status: AC | PRN
  Administered 2014-11-27: 80 mL via INTRAVENOUS

## 2014-11-27 MED ORDER — ACETAMINOPHEN 325 MG PO TABS
650.0000 mg | ORAL_TABLET | Freq: Once | ORAL | Status: DC
  Filled 2014-11-27: qty 2

## 2014-11-27 MED ORDER — MORPHINE SULFATE 4 MG/ML IJ SOLN
4.0000 mg | Freq: Once | INTRAMUSCULAR | Status: AC
  Administered 2014-11-27: 4 mg via INTRAVENOUS
  Filled 2014-11-27: qty 1

## 2014-11-27 MED ORDER — SODIUM CHLORIDE 0.9 % IV BOLUS (SEPSIS)
125.0000 mL | Freq: Once | INTRAVENOUS | Status: AC
  Administered 2014-11-27: 125 mL via INTRAVENOUS

## 2014-11-27 NOTE — ED Notes (Signed)
Pt placed on 2L/min [employer] oxygen d/t SpO2 of 86% on RA.

## 2014-11-27 NOTE — ED Notes (Signed)
SHP bedside. 

## 2014-11-27 NOTE — ED Notes (Signed)
Pt agitated when offered tylenol - states "No you don't understand, I need PAIN MEDICINE!" when pt advised that acetaminophen is an analgesic pt refused to take the medication. Dr. Broadus John, EDP made aware.

## 2014-11-27 NOTE — ED Notes (Signed)
Per Dr. Broadus John, EDP pt's c-collar may be removed. Pt informed and C-collar removed.
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
## 2014-11-27 NOTE — ED Notes (Signed)
Pt continues to specifically request dialudid for pain management - when undisturbed pt found asleep and snoring. Pt states "when I was at [facility] morphine didn't work and they gave me dilaudid" - Dr. Broadus John made aware of pt's request.

## 2014-11-27 NOTE — ED Notes (Signed)
Patient transported to CT 

## 2014-11-28 MED ORDER — ORPHENADRINE CITRATE ER 100 MG PO TB12: 100.0000 mg | ORAL_TABLET | Freq: Two times a day (BID) | ORAL | Status: DC

## 2014-11-28 MED ORDER — ACETAMINOPHEN 500 MG PO TABS: 1000.0000 mg | ORAL_TABLET | Freq: Four times a day (QID) | ORAL | Status: DC | PRN
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
## 2014-11-28 NOTE — ED Notes (Signed)
Per Dr. Broadus John, CDS serology may be cancelled.

## 2014-11-28 NOTE — Discharge Instructions (Signed)
Cervical Sprain A cervical sprain is an injury in the neck in which the strong, fibrous tissues (ligaments) that connect your neck bones stretch or tear. Cervical sprains can range from mild to severe. Severe cervical sprains can cause the neck vertebrae to be unstable. This can lead to damage of the spinal cord and can result in serious nervous system problems. The amount of time it takes for a cervical sprain to get better depends on the cause and extent of the injury. Most cervical sprains heal in 1 to 3 weeks. CAUSES  Severe cervical sprains may be caused by:   Contact sport injuries (such as from football, rugby, wrestling, hockey, auto racing, gymnastics, diving, martial arts, or boxing).   Motor vehicle collisions.   Whiplash injuries. This is an injury from a sudden forward and backward whipping movement of the head and neck.  Falls.  Mild cervical sprains may be caused by:   Being in an awkward position, such as while cradling a telephone between your ear and shoulder.   Sitting in a chair that does not offer proper support.   Working at a poorly Landscape architect station.   Looking up or down for long periods of time.  SYMPTOMS   Pain, soreness, stiffness, or a burning sensation in the front, back, or sides of the neck. This discomfort may develop immediately after the injury or slowly, 24 hours or more after the injury.   Pain or tenderness directly in the middle of the back of the neck.   Shoulder or upper back pain.   Limited ability to move the neck.   Headache.   Dizziness.   Weakness, numbness, or tingling in the hands or arms.   Muscle spasms.   Difficulty swallowing or chewing.   Tenderness and swelling of the neck.  DIAGNOSIS  Most of the time your health care provider can diagnose a cervical sprain by taking your history and doing a physical exam. Your health care provider will ask about previous neck injuries and any known neck  problems, such as arthritis in the neck. X-rays may be taken to find out if there are any other problems, such as with the bones of the neck. Other tests, such as a CT scan or MRI, may also be needed.  TREATMENT  Treatment depends on the severity of the cervical sprain. Mild sprains can be treated with rest, keeping the neck in place (immobilization), and pain medicines. Severe cervical sprains are immediately immobilized. Further treatment is done to help with pain, muscle spasms, and other symptoms and may include:  Medicines, such as pain relievers, numbing medicines, or muscle relaxants.   Physical therapy. This may involve stretching exercises, strengthening exercises, and posture training. Exercises and improved posture can help stabilize the neck, strengthen muscles, and help stop symptoms from returning.  HOME CARE INSTRUCTIONS   Put ice on the injured area.   Put ice in a plastic bag.   Place a towel between your skin and the bag.   Leave the ice on for 15-20 minutes, 3-4 times a day.   If your injury was severe, you may have been given a cervical collar to wear. A cervical collar is a two-piece collar designed to keep your neck from moving while it heals.  Do not remove the collar unless instructed by your health care provider.  If you have long hair, keep it outside of the collar.  Ask your health care provider before making any adjustments to your collar. Minor  adjustments may be required over time to improve comfort and reduce pressure on your chin or on the back of your head.  Ifyou are allowed to remove the collar for cleaning or bathing, follow your health care provider's instructions on how to do so safely.  Keep your collar clean by wiping it with mild soap and water and drying it completely. If the collar you have been given includes removable pads, remove them every 1-2 days and hand wash them with soap and water. Allow them to air dry. They should be completely  dry before you wear them in the collar.  If you are allowed to remove the collar for cleaning and bathing, wash and dry the skin of your neck. Check your skin for irritation or sores. If you see any, tell your health care provider.  Do not drive while wearing the collar.   Only take over-the-counter or prescription medicines for pain, discomfort, or fever as directed by your health care provider.   Keep all follow-up appointments as directed by your health care provider.   Keep all physical therapy appointments as directed by your health care provider.   Make any needed adjustments to your workstation to promote good posture.   Avoid positions and activities that make your symptoms worse.   Warm up and stretch before being active to help prevent problems.  SEEK MEDICAL CARE IF:   Your pain is not controlled with medicine.   You are unable to decrease your pain medicine over time as planned.   Your activity level is not improving as expected.  SEEK IMMEDIATE MEDICAL CARE IF:   You develop any bleeding.  You develop stomach upset.  You have signs of an allergic reaction to your medicine.   Your symptoms get worse.   You develop new, unexplained symptoms.   You have numbness, tingling, weakness, or paralysis in any part of your body.  MAKE SURE YOU:   Understand these instructions.  Will watch your condition.  Will get help right away if you are not doing well or get worse. Document Released: 02/17/2007 Document Revised: 04/27/2013 Document Reviewed: 10/28/2012 [facility] Patient Information 2015 [place], [place]. This information is not intended to replace advice given to you by your health care provider. Make sure you discuss any questions you have with your health care provider.  Blunt Chest Trauma Blunt chest trauma is an injury caused by a blow to the chest. These chest injuries can be very painful. Blunt chest trauma often results in bruised or broken  (fractured) ribs. Most cases of bruised and fractured ribs from blunt chest traumas get better after 1 to 3 weeks of rest and pain medicine. Often, the soft tissue in the chest wall is also injured, causing pain and bruising. Internal organs, such as the heart and lungs, may also be injured. Blunt chest trauma can lead to serious medical problems. This injury requires immediate medical care. CAUSES   Motor vehicle collisions.  Falls.  Physical violence.  Sports injuries. SYMPTOMS   Chest pain. The pain may be worse when you move or breathe deeply.  Shortness of breath.  Lightheadedness.  Bruising.  Tenderness.  Swelling. DIAGNOSIS  Your caregiver will do a physical exam. X-rays may be taken to look for fractures. However, minor rib fractures may not show up on X-rays until a few days after the injury. If a more serious injury is suspected, further imaging tests may be done. This may include ultrasounds, computed tomography (CT) scans, or magnetic  resonance imaging (MRI). TREATMENT  Treatment depends on the severity of your injury. Your caregiver may prescribe pain medicines and deep breathing exercises. HOME CARE INSTRUCTIONS  Limit your activities until you can move around without much pain.  Do not do any strenuous work until your injury is healed.  Put ice on the injured area.  Put ice in a plastic bag.  Place a towel between your skin and the bag.  Leave the ice on for 15-20 minutes, 03-04 times a day.  You may wear a rib belt as directed by your caregiver to reduce pain.  Practice deep breathing as directed by your caregiver to keep your lungs clear.  Only take over-the-counter or prescription medicines for pain, fever, or discomfort as directed by your caregiver. SEEK IMMEDIATE MEDICAL CARE IF:   You have increasing pain or shortness of breath.  You cough up blood.  You have nausea, vomiting, or abdominal pain.  You have a fever.  You feel dizzy, weak, or  you faint. MAKE SURE YOU:  Understand these instructions.  Will watch your condition.  Will get help right away if you are not doing well or get worse. Document Released: 05/30/2004 Document Revised: 07/15/2011 Document Reviewed: 02/06/2011 [facility] Patient Information 2015 [place], [place]. This information is not intended to replace advice given to you by your health care provider. Make sure you discuss any questions you have with your health care provider. Chest Contusion A chest contusion is a deep bruise on your chest area. Contusions are the result of an injury that caused bleeding under the skin. A chest contusion may involve bruising of the skin, muscles, or ribs. The contusion may turn blue, purple, or yellow. Minor injuries will give you a painless contusion, but more severe contusions may stay painful and swollen for a few weeks. CAUSES  A contusion is usually caused by a blow, trauma, or direct force to an area of the body. SYMPTOMS   Swelling and redness of the injured area.  Discoloration of the injured area.  Tenderness and soreness of the injured area.  Pain. DIAGNOSIS  The diagnosis can be made by taking a history and performing a physical exam. An X-ray, CT scan, or MRI may be needed to determine if there were any associated injuries, such as broken bones (fractures) or internal injuries. TREATMENT  Often, the best treatment for a chest contusion is resting, icing, and applying cold compresses to the injured area. Deep breathing exercises may be recommended to reduce the risk of pneumonia. Over-the-counter medicines may also be recommended for pain control. HOME CARE INSTRUCTIONS   Put ice on the injured area.  Put ice in a plastic bag.  Place a towel between your skin and the bag.  Leave the ice on for 15-20 minutes, 03-04 times a day.  Only take over-the-counter or prescription medicines as directed by your caregiver. Your caregiver may recommend avoiding  anti-inflammatory medicines (aspirin, ibuprofen, and naproxen) for 48 hours because these medicines may increase bruising.  Rest the injured area.  Perform deep-breathing exercises as directed by your caregiver.  Stop smoking if you smoke.  Do not lift objects over 5 pounds (2.3 kg) for 3 days or longer if recommended by your caregiver. SEEK IMMEDIATE MEDICAL CARE IF:   You have increased bruising or swelling.  You have pain that is getting worse.  You have difficulty breathing.  You have dizziness, weakness, or fainting.  You have blood in your urine or stool.  You cough up  or vomit blood.  Your swelling or pain is not relieved with medicines. MAKE SURE YOU:   Understand these instructions.  Will watch your condition.  Will get help right away if you are not doing well or get worse. Document Released: 01/15/2001 Document Revised: 01/15/2012 Document Reviewed: 10/14/2011 [facility] Patient Information 2015 [place], [place]. This information is not intended to replace advice given to you by your health care provider. Make sure you discuss any questions you have with your health care provider.  Emergency Department Resource Guide 1) Find a Doctor and Pay Out of Pocket Although you won't have to find out who is covered by your insurance plan, it is a good idea to ask around and get recommendations. You will then need to call the office and see if the doctor you have chosen will accept you as a new patient and what types of options they offer for patients who are self-pay. Some doctors offer discounts or will set up payment plans for their patients who do not have insurance, but you will need to ask so you aren't surprised when you get to your appointment.  2) Contact Your Local Health Department Not all health departments have doctors that can see patients for sick visits, but many do, so it is worth a call to see if yours does. If you don't know where your local health department  is, you can check in your phone book. The CDC also has a tool to help you locate your state's health department, and many state websites also have listings of all of their local health departments.  3) Find a Walk-in Clinic If your illness is not likely to be very severe or complicated, you may want to try a walk in clinic. These are popping up all over the country in pharmacies, drugstores, and shopping centers. They're usually staffed by nurse practitioners or physician assistants that have been trained to treat common illnesses and complaints. They're usually fairly quick and inexpensive. However, if you have serious medical issues or chronic medical problems, these are probably not your best option.  No Primary Care Doctor: - Call Health Connect at  [phone] - they can help you locate a primary care doctor that  accepts your insurance, provides certain services, etc. - Physician Referral Service- [phone]  Chronic Pain Problems: Organization         Address  Phone   Notes  [facility]  [phone] Patients need to be referred by their primary care doctor.   Medication Assistance: Organization         Address  Phone   Notes  [facility] Medication [facility] [address] [place] [place], [place] [address] [phone] --Must be a resident of [facility] -- Must have NO insurance coverage whatsoever (no Medicaid/ Medicare, etc.) -- The pt. MUST have a primary care doctor that directs their care regularly and follows them in the community   MedAssist  [phone]   [employer]  [phone]    Agencies that provide inexpensive medical care: Organization         Address  Phone   Notes  [facility]  [phone]   [facility]    [phone]   [facility] [address] [place], [place] [address] [phone]   [facility] [address], [place]  [phone]   Planned Parenthood    [phone]  [facility]    [phone]   [facility] and [facility]  [address], [place] Phone:  [phone], Fax:  [phone] Hours of Operation:  9 am - 6 pm, M-F.  Also accepts Medicaid/Medicare and self-pay.  [facility]  [address], [place] Phone: [phone], Fax: [phone]. Hours of Operation:  8:30 am - 5:30 pm, M-F.  Also accepts Medicaid and self-pay.  [facility] [place] [address] [place] Phone: [phone]   [facility] [address] Natasha Bence [place], [place] [phone], Ext. [phone] Mondays & Thursdays: 7-9 AM.  First 15 patients are seen on a first come, first serve basis.    Medicaid-accepting [facility] Providers:  Organization         Address  Phone   Notes  [facility] [address], [place] [phone] Also accepts self-pay patients.  [facility] [address] Laurell Josephs [place], [place]  [phone]   [facility] [address], [place] [phone]   [facility] [address] [place] [phone]   Renaye Rakers [address], [place]   [phone] Only accepts [place] patients after they have their name applied to their card.   Self-Pay (no insurance) in [facility]:  Organization         Address  Phone   Notes  Sickle Cell Patients, [facility] [address] [place], [place] [phone]   [facility] [address] [place], [place] [phone]   [facility] [place]  [address], [place] [phone]   [facility]/Dr. Osei-Bonsu  [address], [place] or [address], [place] [phone] Phone number for both [place] and [place]  locations is the same.  [facility] and [facility] [address] [place] [phone]   [facility] [address], [place] or [address] [phone]   [facility] [address] [place] [phone], phone; [phone], fax Sees patients 1st and 3rd Saturday of every month.  Must not qualify for public or private insurance (i.e. Medicaid, Medicare, [place] Health Choice, Veterans' Benefits)  Household income should be no more than 200% of the poverty level The clinic cannot treat you if you are pregnant or think you are pregnant  Sexually transmitted diseases are not treated at the clinic.    Dental Care: Organization         Address  Phone  Notes  [facility] of [facility] [address] [place], [place] [phone] Accepts children up to age 87 who are enrolled in [place] or [place] Health Choice; pregnant women with a Medicaid card; and children who have applied for Medicaid or [place] Health Choice, but were declined, whose parents can pay a reduced fee at time of service.  [facility]  [address], [place] [phone] Accepts children up to age 20 who are enrolled in [place] or [place] Health Choice; pregnant women with a Medicaid card; and children who have applied for Medicaid or [place] Health Choice, but were declined, whose parents can pay a reduced fee at time of  service.  [facility]  [address] [place], [place] [phone] Patients are seen by appointment only. Walk-ins are not accepted. [facility] will see patients 85 years of age and older. Monday - Tuesday (8am-5pm) Most Wednesdays (8:30-5pm) $30 per visit, cash only  [facility]  [address], [facility] [phone] Patients are seen by appointment only. Walk-ins are not accepted. [facility]  will see patients 56 years of age and older. One Wednesday Evening (Monthly: Volunteer Based).  $30 per visit, cash only  [employer]  [phone] for adults; Children under age 82, call Graduate Pediatric Dentistry at [phone]. Children aged 69-14, please call [phone] to request a pediatric application.  Dental services are provided in all areas of dental care including fillings, crowns and bridges, complete and partial dentures, implants, gum treatment, root canals, and extractions. Preventive care is also provided. Treatment is provided to both adults and children. Patients are selected via a lottery and there is often a waiting list.   [facility] [address], [place]  [phone] [email or web address]   [facility] [address] [place], [place] [phone], Ext. [phone] Second and Fourth Thursday of each month, opens at 6:30 AM; Clinic ends at 9 AM.  Patients are seen on a first-come first-served basis, and a limited number are seen during each clinic.   [facility]  [address] Ether Griffins [place], [place] [phone]   Eligibility Requirements You must have lived in [place], [place], or [place] counties for at least the last three months.   You cannot be eligible for state or federal sponsored [place], including [employer], [place], or [employer].   You generally cannot be eligible for healthcare insurance through your employer.    How to apply: Eligibility screenings are held every Tuesday and Wednesday afternoon from 1:00 pm until 4:00 pm. You do not need an appointment for the interview!  [facility] [address], [place], [place] [phone]   [facility] Health Department  [phone]   [facility] Health Department  [phone]   [facility] Health [facility]  [phone]    Behavioral Health Resources in the Community: Intensive Outpatient  Programs Organization         Address  Phone  Notes  [facility] [address], [place], [place] [phone]   [facility] [address], [place], [place] [phone]   [facility] [address], [place], [place]  [phone]   [facility] [address],  [place], [place] [phone] or [phone]   Substance Abuse Resources Organization         Address  Phone  Notes  [facility]  [phone]   [facility]  [phone]   [facility]  [phone]   Floydene Flock  [phone]   Residential & Outpatient Substance Abuse Program  [phone]   Psychological Services Organization         Address  Phone  Notes  [facility]  [phone]   [facility] Services  [phone]   [facility] [address] [place] [phone] or [phone]    Mobile Crisis Teams Organization         Address  Phone  Notes  [facility], Mobile Crisis Care Unit  [phone]   [facility]  [address] [place], [place] [phone]   [facility] [address] [place] [phone]    Self-Help/Support Groups Organization         Address  Phone             Notes  Mental Health Assoc. of  - variety of support groups  [phone]- [phone] Call for more information  Narcotics Anonymous (NA), [facility] [address], [place]  2 meetings at this location   Statisticianesidential Treatment Programs Organization         Address  Phone  Notes  [facility] [address],    [place]  [phone]   [facility]  [address], [place] [phone], [place], [place] [phone]   [facility] [address], [place] [phone] Admissions: 8am-3pm M-F  [facility] [address]    [place], [place]  [phone]   [facility] [address], [place], [place] [phone]   [facility] [address]  [place], [place] [phone]   [facility] - Intensive Outpatient [address], [place], [place] [phone]   [facility] ([facility] [address]  [place], [place] [phone] or [phone]   [facility] ([facility]) [address] [place], [place] [phone] Accepts Medicaid  [facility] [address]  [place] [phone] Substance Abuse/Addiction Treatment   [facility] Behavior[facility] Health Resources Organization         Address  Phone  Notes  [facility]  [phone]   Angie FavaJulie Brannon, PhD [address] [place], [place]   [phone] or [phone]   [facility]   [address] [place], [place] [phone]   [facility] [address], [place], [place] [phone] Insurance/Medicaid/sponsorship through [facility]  [facility] Families [address]                                    [place], [place] [phone] Therapy/tele-psych/case  [facility] [address]   [place], [place] [phone]) [phone]    Dr. Lolly MustacheArfeen  [phone]) [phone]   [facility]  [employer] [facility] 1) [address], [place] 2) [address], [place] 3)  [address], [place] [phone]) [phone]  [phone]) [phone]   [facility] [phone] or [phone]) [phone] (After Hours)

## 2014-11-28 NOTE — ED Notes (Signed)
Pt ambulating independently w/ steady gait on d/c in no acute distress, A&Ox4. D/c instructions reviewed w/ pt and family - pt and family deny any further questions or concerns at present. Rx given x2  

## 2014-12-01 ENCOUNTER — Ambulatory Visit (HOSPITAL_COMMUNITY)
Admission: RE | Admit: 2014-12-01 | Discharge: 2014-12-01 | Disposition: A | Discharge status: Home / Self Care | Payer: Self-pay | Source: Ambulatory Visit | Attending: Orthopaedic Surgery | Admitting: Orthopaedic Surgery

## 2014-12-01 DIAGNOSIS — S42252D Displaced fracture of greater tuberosity of left humerus, subsequent encounter for fracture with routine healing: Secondary | ICD-10-CM | POA: Insufficient documentation

## 2014-12-01 DIAGNOSIS — T148XXA Other injury of unspecified body region, initial encounter: Secondary | ICD-10-CM

## 2014-12-01 DIAGNOSIS — X58XXXD Exposure to other specified factors, subsequent encounter: Secondary | ICD-10-CM | POA: Insufficient documentation

## 2014-12-12 ENCOUNTER — Encounter (HOSPITAL_COMMUNITY): Payer: Self-pay | Admitting: Emergency Medicine

## 2014-12-12 ENCOUNTER — Emergency Department (HOSPITAL_COMMUNITY): Payer: Self-pay

## 2014-12-12 ENCOUNTER — Emergency Department (HOSPITAL_COMMUNITY)
Admission: EM | Admit: 2014-12-12 | Discharge: 2014-12-12 | Disposition: A | Discharge status: Home / Self Care | Payer: Self-pay | Attending: Emergency Medicine | Admitting: Emergency Medicine

## 2014-12-12 DIAGNOSIS — J441 Chronic obstructive pulmonary disease with (acute) exacerbation: Secondary | ICD-10-CM | POA: Insufficient documentation

## 2014-12-12 DIAGNOSIS — IMO0002 Reserved for concepts with insufficient information to code with codable children: Secondary | ICD-10-CM

## 2014-12-12 DIAGNOSIS — I129 Hypertensive chronic kidney disease with stage 1 through stage 4 chronic kidney disease, or unspecified chronic kidney disease: Secondary | ICD-10-CM | POA: Insufficient documentation

## 2014-12-12 DIAGNOSIS — F329 Major depressive disorder, single episode, unspecified: Secondary | ICD-10-CM | POA: Insufficient documentation

## 2014-12-12 DIAGNOSIS — M7981 Nontraumatic hematoma of soft tissue: Secondary | ICD-10-CM | POA: Insufficient documentation

## 2014-12-12 DIAGNOSIS — Z720 Tobacco use: Secondary | ICD-10-CM | POA: Insufficient documentation

## 2014-12-12 DIAGNOSIS — Z8711 Personal history of peptic ulcer disease: Secondary | ICD-10-CM | POA: Insufficient documentation

## 2014-12-12 DIAGNOSIS — N189 Chronic kidney disease, unspecified: Secondary | ICD-10-CM | POA: Insufficient documentation

## 2014-12-12 DIAGNOSIS — F419 Anxiety disorder, unspecified: Secondary | ICD-10-CM | POA: Insufficient documentation

## 2014-12-12 DIAGNOSIS — Z8619 Personal history of other infectious and parasitic diseases: Secondary | ICD-10-CM | POA: Insufficient documentation

## 2014-12-12 DIAGNOSIS — Z8719 Personal history of other diseases of the digestive system: Secondary | ICD-10-CM | POA: Insufficient documentation

## 2014-12-12 DIAGNOSIS — G40909 Epilepsy, unspecified, not intractable, without status epilepticus: Secondary | ICD-10-CM | POA: Insufficient documentation

## 2014-12-12 DIAGNOSIS — Z79899 Other long term (current) drug therapy: Secondary | ICD-10-CM | POA: Insufficient documentation

## 2014-12-12 LAB — BODY FLUID CELL COUNT WITH DIFFERENTIAL
EOS FL: 0 %
Lymphs, Fluid: 65 %
Monocyte-Macrophage-Serous Fluid: 34 % — ABNORMAL LOW (ref 50–90)
NEUTROPHIL FLUID: 1 % (ref 0–25)
Total Nucleated Cell Count, Fluid: 606 cu mm (ref 0–1000)

## 2014-12-12 LAB — RAPID URINE DRUG SCREEN, HOSP PERFORMED
Amphetamines: NOT DETECTED
BARBITURATES: NOT DETECTED
BENZODIAZEPINES: POSITIVE — AB
Cocaine: POSITIVE — AB
OPIATES: POSITIVE — AB
TETRAHYDROCANNABINOL: NOT DETECTED

## 2014-12-12 LAB — COMPREHENSIVE METABOLIC PANEL
ALT: 17 U/L (ref 14–54)
AST: 17 U/L (ref 15–41)
Albumin: 4.1 g/dL (ref 3.5–5.0)
Alkaline Phosphatase: 164 U/L — ABNORMAL HIGH (ref 38–126)
Anion gap: 7 (ref 5–15)
BUN: 13 mg/dL (ref 6–20)
CO2: 24 mmol/L (ref 22–32)
Calcium: 8.8 mg/dL — ABNORMAL LOW (ref 8.9–10.3)
Chloride: 106 mmol/L (ref 101–111)
Creatinine, Ser: 0.65 mg/dL (ref 0.44–1.00)
GFR calc Af Amer: >60 mL/min (ref >60)
GFR calc non Af Amer: >60 mL/min (ref >60)
Glucose, Bld: 94 mg/dL (ref 65–99)
Potassium: 4.1 mmol/L (ref 3.5–5.1)
Sodium: 137 mmol/L (ref 135–145)
Total Bilirubin: 0.3 mg/dL (ref 0.3–1.2)
Total Protein: 7.7 g/dL (ref 6.5–8.1)

## 2014-12-12 LAB — CBC
HCT: 38.7 % (ref 36.0–46.0)
Hemoglobin: 13.3 g/dL (ref 12.0–15.0)
MCH: 33.3 pg (ref 26.0–34.0)
MCHC: 34.4 g/dL (ref 30.0–36.0)
MCV: 96.8 fL (ref 78.0–100.0)
Platelets: 317 10*3/uL (ref 150–400)
RBC: 4 MIL/uL (ref 3.87–5.11)
RDW: 12.4 % (ref 11.5–15.5)
WBC: 7.8 10*3/uL (ref 4.0–10.5)

## 2014-12-12 LAB — TROPONIN I: Troponin I: <0.03 ng/mL (ref <0.031)

## 2014-12-12 MED ORDER — PREDNISONE 20 MG PO TABS: 40.0000 mg | ORAL_TABLET | Freq: Every day | ORAL | Status: DC

## 2014-12-12 MED ORDER — METHYLPREDNISOLONE SODIUM SUCC 125 MG IJ SOLR
60.0000 mg | Freq: Once | INTRAMUSCULAR | Status: AC
  Administered 2014-12-12: 60 mg via INTRAVENOUS
  Filled 2014-12-12: qty 2

## 2014-12-12 MED ORDER — DOXYCYCLINE HYCLATE 50 MG PO CAPS: 100.0000 mg | ORAL_CAPSULE | Freq: Two times a day (BID) | ORAL | Status: DC

## 2014-12-12 MED ORDER — IPRATROPIUM-ALBUTEROL 0.5-2.5 (3) MG/3ML IN SOLN
3.0000 mL | Freq: Once | RESPIRATORY_TRACT | Status: AC
  Administered 2014-12-12: 3 mL via RESPIRATORY_TRACT
  Filled 2014-12-12: qty 3

## 2014-12-12 MED ORDER — DOXYCYCLINE HYCLATE 100 MG PO TABS
100.0000 mg | ORAL_TABLET | Freq: Once | ORAL | Status: AC
  Administered 2014-12-12: 100 mg via ORAL
  Filled 2014-12-12: qty 1

## 2014-12-12 MED ORDER — ONDANSETRON 4 MG PO TBDP
4.0000 mg | ORAL_TABLET | Freq: Once | ORAL | Status: DC | PRN
  Administered 2014-12-12: 4 mg via ORAL
  Filled 2014-12-12: qty 1

## 2014-12-12 MED ORDER — LIDOCAINE-EPINEPHRINE (PF) 1 %-1:200000 IJ SOLN
20.0000 mL | Freq: Once | INTRAMUSCULAR | Status: AC
  Administered 2014-12-12: 10 mL via INTRADERMAL
  Filled 2014-12-12: qty 10

## 2014-12-12 MED ORDER — OXYCODONE-ACETAMINOPHEN 5-325 MG PO TABS
1.0000 | ORAL_TABLET | Freq: Four times a day (QID) | ORAL | Status: DC | PRN
Start: 2014-12-12 — End: 2014-12-12
  Administered 2014-12-12: 1 via ORAL
  Filled 2014-12-12: qty 1

## 2014-12-12 MED ORDER — OXYCODONE-ACETAMINOPHEN 5-325 MG PO TABS
1.0000 | ORAL_TABLET | ORAL | Status: DC | PRN
Start: 2014-12-12 — End: 2014-12-12

## 2014-12-12 MED ORDER — MORPHINE SULFATE 2 MG/ML IJ SOLN
2.0000 mg | INTRAMUSCULAR | Status: DC | PRN
  Administered 2014-12-12: 2 mg via INTRAVENOUS
  Filled 2014-12-12: qty 1

## 2014-12-12 NOTE — ED Notes (Signed)
MD at bedside. 

## 2014-12-12 NOTE — ED Notes (Signed)
Pt reports chest pain in R chest with radiation into her back.

## 2014-12-12 NOTE — ED Notes (Signed)
EDP in room to do I&D

## 2014-12-12 NOTE — Discharge Instructions (Signed)
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
## 2014-12-13 LAB — PATHOLOGIST SMEAR REVIEW

## 2014-12-16 LAB — BODY FLUID CULTURE: Culture: NO GROWTH

## 2016-05-21 IMAGING — DX DG SHOULDER 2+V*L*
2 series · 2 of 2 positions shown · non-contrast
Comparison: Left shoulder radiographs 11/01/2014

CLINICAL DATA: Left shoulder fracture.

EXAM:
LEFT SHOULDER - 2+ VIEW

[shoulder y view]
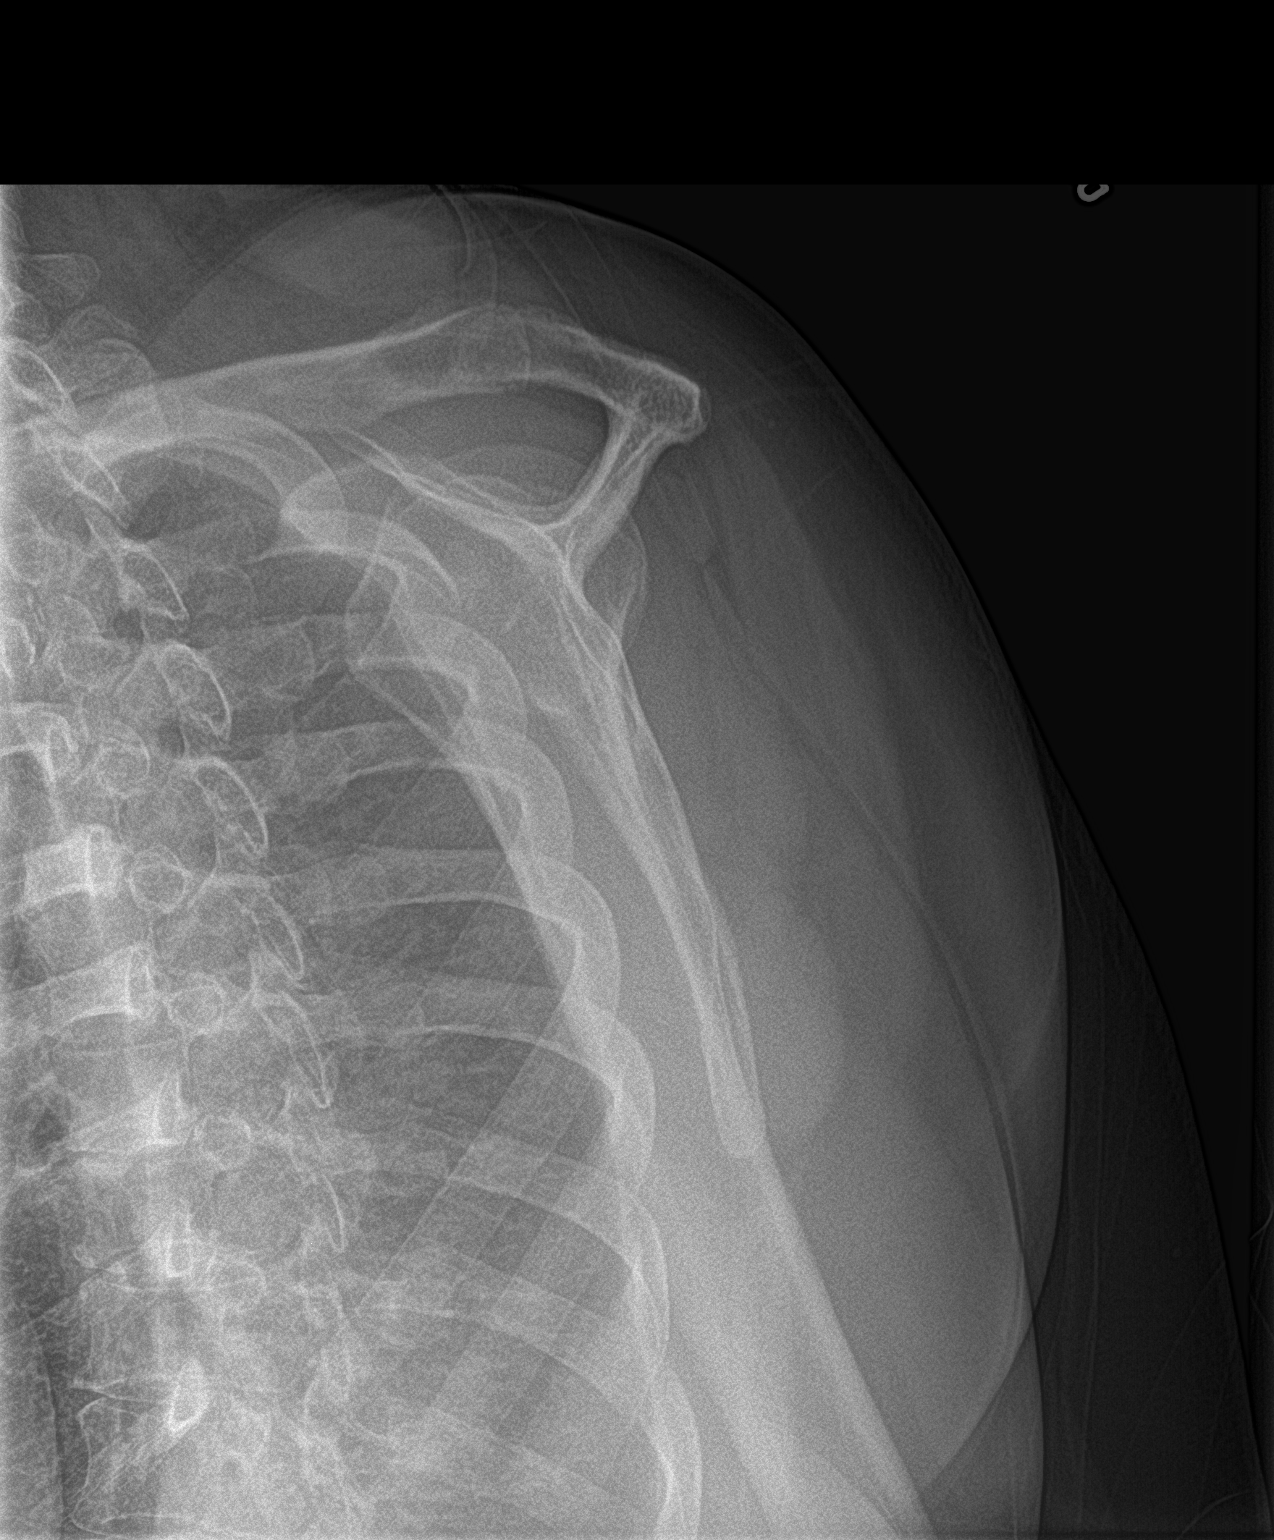

[shoulder ap]
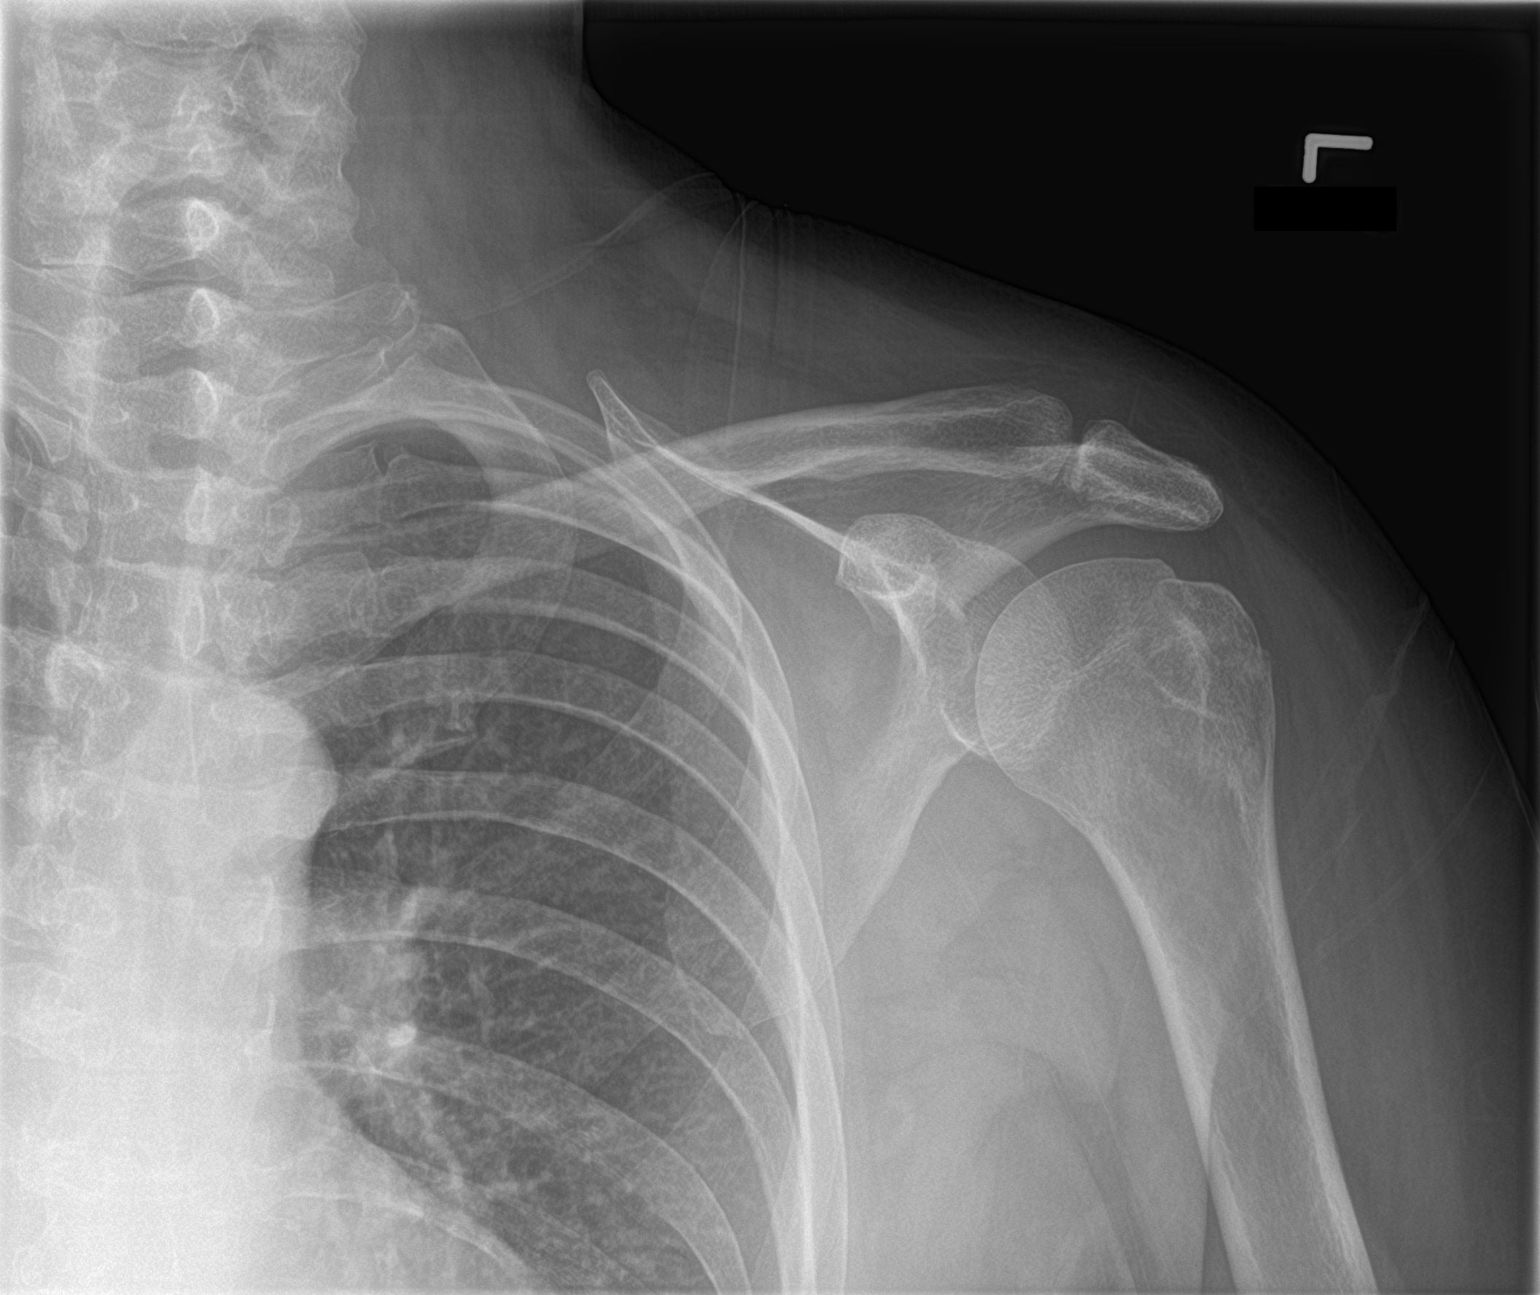

[2 of 2 positions shown; findings below may reference images not displayed]

FINDINGS: Previously noted fracture in the greater tuberosity of the proximal
left humerus demonstrates interval healing. Shoulder is located. No
new fractures are present.
IMPRESSION: Interval healing of greater tuberosity fracture in the proximal left
humerus.

## 2016-06-01 IMAGING — DX DG CHEST 2V
2 series · 2 of 2 positions shown · non-contrast
Comparison: CT chest 11/27/2014

CLINICAL DATA: Chest pain radiating to the back.

EXAM:
CHEST  2 VIEW

[chest pa]
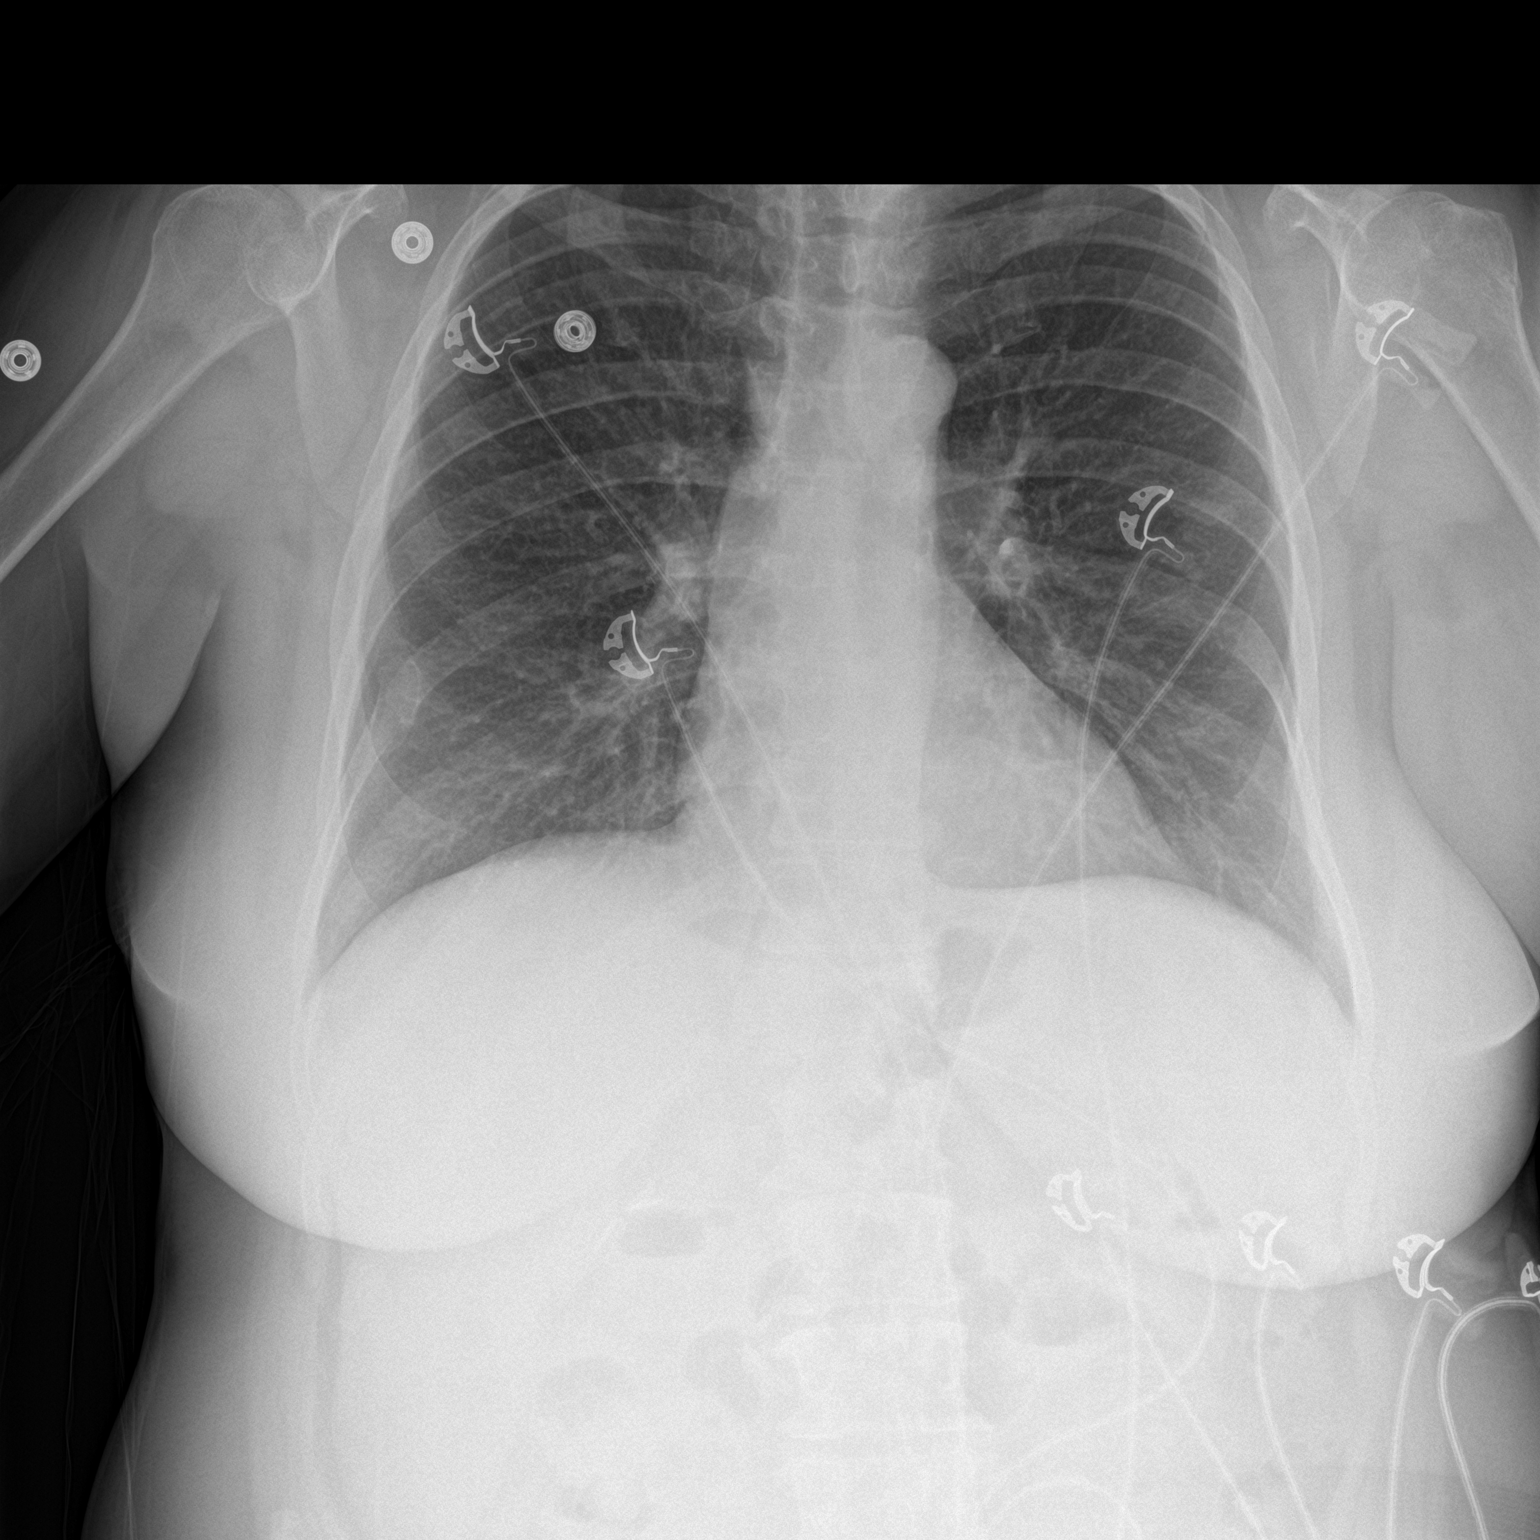

[chest lat]
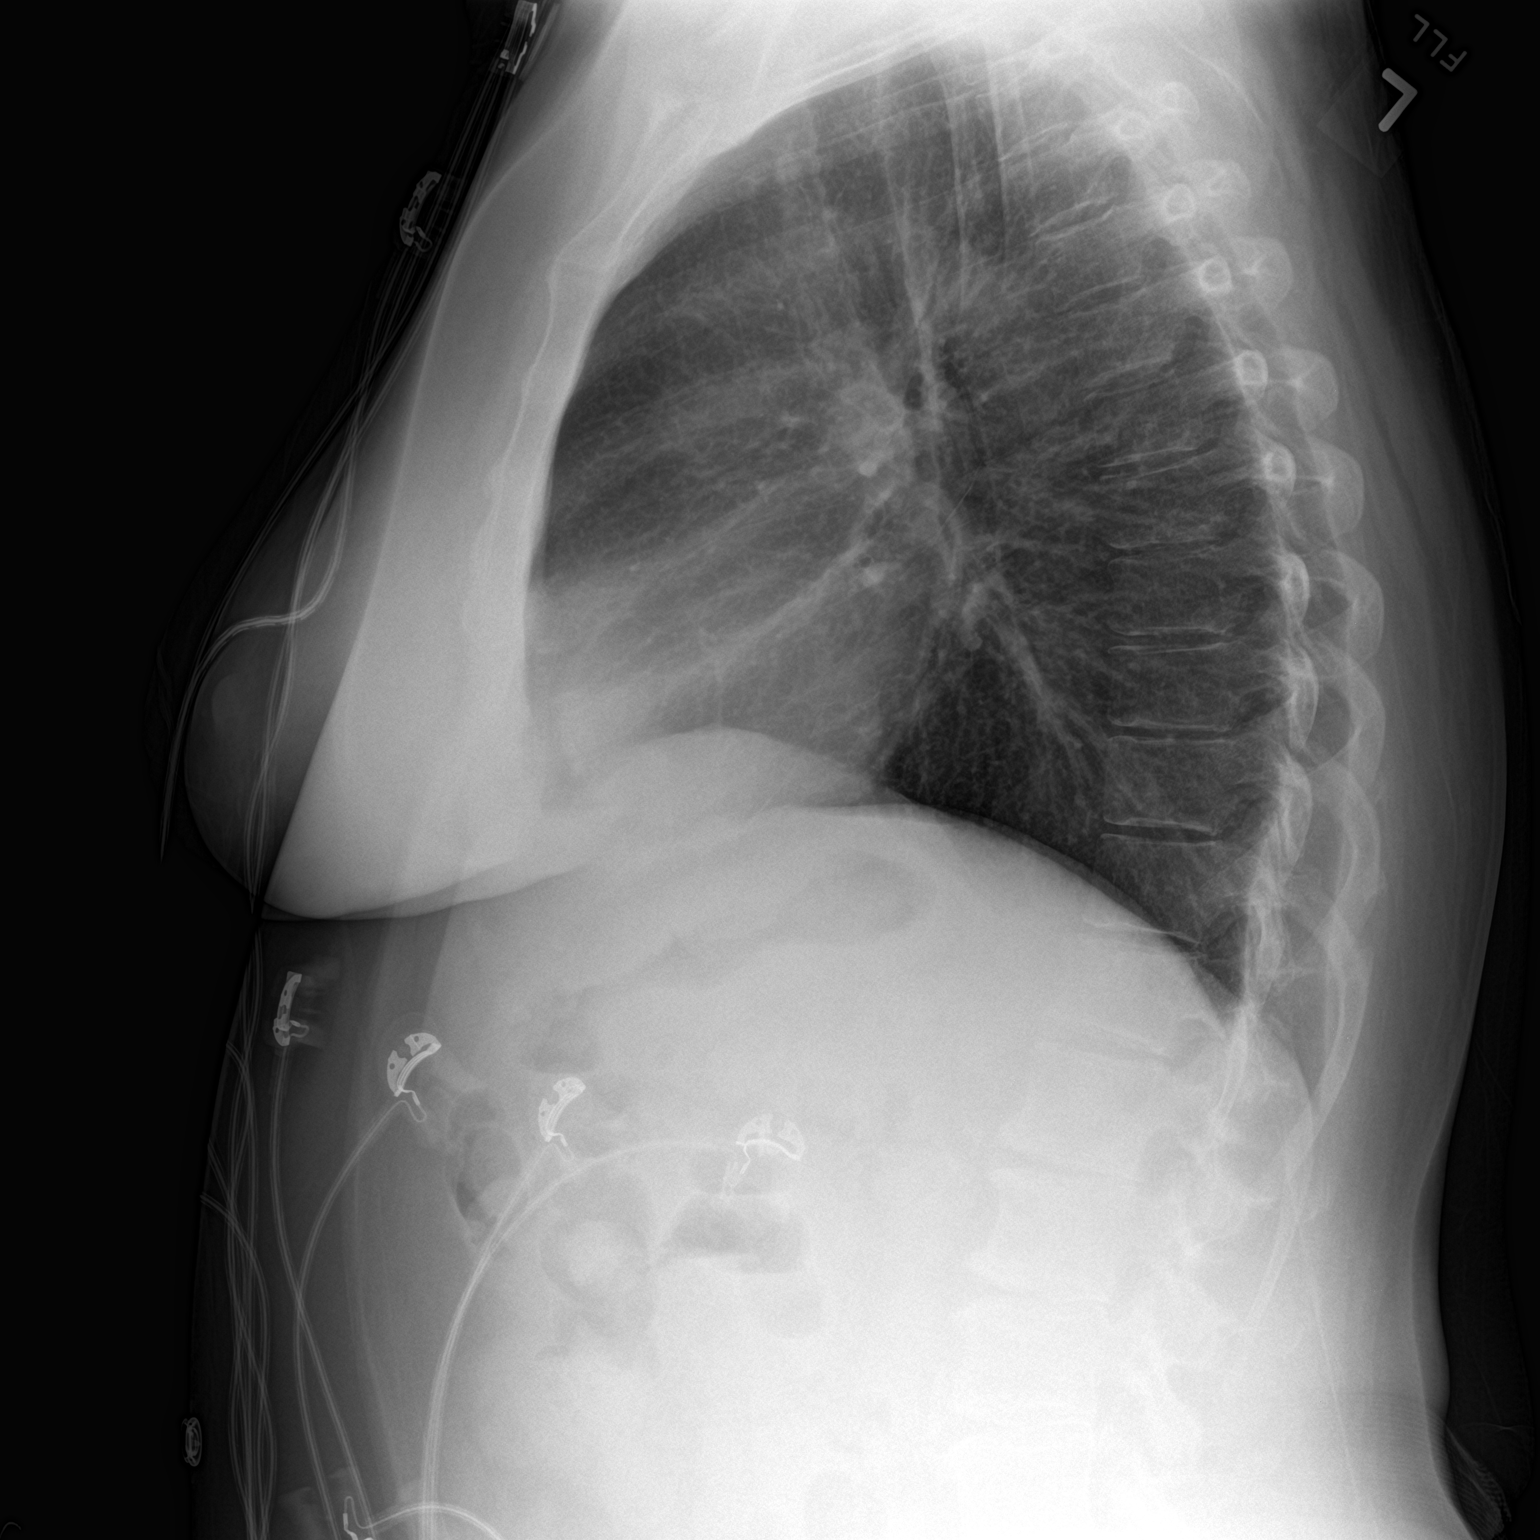

[2 of 2 positions shown; findings below may reference images not displayed]

FINDINGS: There is no focal parenchymal opacity. There is no pleural effusion
or pneumothorax. The heart and mediastinal contours are
unremarkable.

Old posttraumatic injury of the right posterior lower rib.
IMPRESSION: No active cardiopulmonary disease.

## 2017-02-10 ENCOUNTER — Encounter: Payer: Self-pay | Admitting: General Practice

## 2017-02-10 ENCOUNTER — Ambulatory Visit: Payer: Self-pay | Admitting: Pediatrics

## 2017-03-11 ENCOUNTER — Emergency Department (HOSPITAL_COMMUNITY)
Admission: EM | Admit: 2017-03-11 | Discharge: 2017-03-11 | Disposition: A | Discharge status: Home / Self Care | Payer: Medicaid Other | Attending: Emergency Medicine | Admitting: Emergency Medicine

## 2017-03-11 ENCOUNTER — Emergency Department (HOSPITAL_COMMUNITY): Payer: Medicaid Other

## 2017-03-11 ENCOUNTER — Encounter (HOSPITAL_COMMUNITY): Payer: Self-pay | Admitting: Emergency Medicine

## 2017-03-11 ENCOUNTER — Other Ambulatory Visit: Payer: Self-pay

## 2017-03-11 ENCOUNTER — Ambulatory Visit: Payer: Self-pay | Admitting: Family Medicine

## 2017-03-11 DIAGNOSIS — N189 Chronic kidney disease, unspecified: Secondary | ICD-10-CM | POA: Diagnosis not present

## 2017-03-11 DIAGNOSIS — Z79899 Other long term (current) drug therapy: Secondary | ICD-10-CM | POA: Diagnosis not present

## 2017-03-11 DIAGNOSIS — I129 Hypertensive chronic kidney disease with stage 1 through stage 4 chronic kidney disease, or unspecified chronic kidney disease: Secondary | ICD-10-CM | POA: Insufficient documentation

## 2017-03-11 DIAGNOSIS — Y999 Unspecified external cause status: Secondary | ICD-10-CM | POA: Insufficient documentation

## 2017-03-11 DIAGNOSIS — F17210 Nicotine dependence, cigarettes, uncomplicated: Secondary | ICD-10-CM | POA: Diagnosis not present

## 2017-03-11 DIAGNOSIS — Y929 Unspecified place or not applicable: Secondary | ICD-10-CM | POA: Diagnosis not present

## 2017-03-11 DIAGNOSIS — S92302A Fracture of unspecified metatarsal bone(s), left foot, initial encounter for closed fracture: Secondary | ICD-10-CM

## 2017-03-11 DIAGNOSIS — W19XXXA Unspecified fall, initial encounter: Secondary | ICD-10-CM | POA: Insufficient documentation

## 2017-03-11 DIAGNOSIS — Y939 Activity, unspecified: Secondary | ICD-10-CM | POA: Diagnosis not present

## 2017-03-11 DIAGNOSIS — S99912A Unspecified injury of left ankle, initial encounter: Secondary | ICD-10-CM | POA: Diagnosis present

## 2017-03-11 DIAGNOSIS — J449 Chronic obstructive pulmonary disease, unspecified: Secondary | ICD-10-CM | POA: Diagnosis not present

## 2017-03-11 MED ORDER — OXYCODONE-ACETAMINOPHEN 5-325 MG PO TABS: 1.0000 | ORAL_TABLET | ORAL | 0 refills | Status: DC | PRN

## 2017-03-11 MED ORDER — OXYCODONE-ACETAMINOPHEN 5-325 MG PO TABS
1.0000 | ORAL_TABLET | Freq: Once | ORAL | Status: AC
  Administered 2017-03-11: 1 via ORAL
  Filled 2017-03-11: qty 1

## 2017-03-11 NOTE — Discharge Instructions (Signed)
Elevate and apply ice packs on/off to your foot.  Boot until you follow-up with Dr. Hilda LiasKeeling.  Call his office to arrange a follow-appt

## 2017-03-11 NOTE — ED Provider Notes (Signed)
[facility] EMERGENCY DEPARTMENT Provider Note   CSN: [id number] Arrival date & time: 03/11/17  1533     History   Chief Complaint Chief Complaint  Patient presents with  . Ankle Pain    HPI Carilyn GoodpastureVicky G Magley is a 51 y.o. female.  HPI   Carilyn GoodpastureVicky G Russey is a 51 y.o. female who presents to the Emergency Department complaining of left ankle pain for one day.  States that she had a mechanical fall that resulted in an eversion injury to the left ankle.  She describes a constant, throbbing pain to her ankle that is worse with weight bearing and flexion.  She has tried elevation and tylenol without relief.  She denies other injuries, numbness, open wounds, calf or knee pain.    Past Medical History:  Diagnosis Date  . Anxiety and depression   . Chronic kidney disease    small rt kidney stone  . COPD (chronic obstructive pulmonary disease) (HCC)   . DDD (degenerative disc disease)   . GERD (gastroesophageal reflux disease)   . Helicobacter pylori gastritis June 2011   treated with Pylera  . HTN (hypertension)   . IBS (irritable bowel syndrome)   . Peptic stricture of esophagus    dilated twice 10/2009  . PUD (peptic ulcer disease)   . Renal insufficiency   . Seizures (HCC)    had 1 episode of it about 5 years ago, no other hx of seizures  . Shortness of breath    continuous  . Shoulder pain     Patient Active Problem List   Diagnosis Date Noted  . Melena 03/20/2011  . Right sided abdominal pain 03/20/2011  . DIARRHEA, CHRONIC 06/05/2010  . ABDOMINAL PAIN, UNSPECIFIED SITE 03/13/2010  . GERD 10/09/2009  . Esophageal dysphagia 10/09/2009  . PUD, HX OF 10/09/2009  . IRRITABLE BOWEL SYNDROME, HX OF 10/09/2009  . CERVICAL SPASM 09/07/2009  . IMPINGEMENT SYNDROME 06/20/2009  . HIGH BLOOD PRESSURE 06/20/2009    Past Surgical History:  Procedure Laterality Date  . ABDOMINAL HYSTERECTOMY    . BLADDER SURGERY     x 3 in [place]-strected twice and had bladder sling placed  .  CHOLECYSTECTOMY    . ESOPHAGOGASTRODUODENOSCOPY  10/26/09 GASTRITIS/DUODENITIS   Distal esol stricture/dil 15-16 mm. A 17 mm dilator would not pass  . ILEOColonoscopy  04/15/2011   SLF: Colitis in the sigmoid colon/Internal hemorrhoids  . OOPHORECTOMY     Right  . steroid shot in back    . UPPER GASTROINTESTINAL ENDOSCOPY  JUN 14, 2011DYSPHAGIA, EPIG PAIN   H PYLORI GASTRITIS    OB History    Gravida Para Term Preterm AB Living   3 1 1   2      SAB TAB Ectopic Multiple Live Births   1 1             Home Medications    Prior to Admission medications   Medication Sig Start Date End Date Taking? Authorizing Provider  acetaminophen (TYLENOL EX ST ARTHRITIS PAIN) 500 MG tablet Take 500 mg by mouth every 6 (six) hours as needed for moderate pain.     [provider]  acetaminophen (TYLENOL) 500 MG tablet Take 2 tablets (1,000 mg total) by mouth every 6 (six) hours as needed. Patient taking differently: Take 1,000 mg by mouth every 6 (six) hours as needed for mild pain or moderate pain.  11/28/14   Arby BarrettePfeiffer, Marcy, MD  albuterol (PROVENTIL HFA;VENTOLIN HFA) 108 (90 BASE) MCG/ACT inhaler  Inhale 2 puffs into the lungs 4 (four) times daily.     [provider]  citalopram (CELEXA) 40 MG tablet Take 40 mg by mouth daily.    [provider]  diazepam (VALIUM) 10 MG tablet Take 10 mg by mouth 3 (three) times daily.    [provider]  doxycycline (VIBRAMYCIN) 50 MG capsule Take 2 capsules (100 mg total) by mouth 2 (two) times daily. 12/12/14   Rumley, Linthicum N, DO  gabapentin (NEURONTIN) 400 MG capsule Take 400 mg by mouth 3 (three) times daily.    [provider]  ibuprofen (ADVIL,MOTRIN) 200 MG tablet Take 400 mg by mouth every 6 (six) hours as needed for moderate pain.     [provider]  orphenadrine (NORFLEX) 100 MG tablet Take 1 tablet (100 mg total) by mouth 2 (two) times daily. Patient not taking: Reported on 12/12/2014 11/28/14    Arby Barrette, MD  oxyCODONE-acetaminophen (PERCOCET/ROXICET) 5-325 MG per tablet Take 1 tablet by mouth every 6 (six) hours as needed. Patient not taking: Reported on 12/12/2014 10/17/14   Ivery Quale, PA-C  predniSONE (DELTASONE) 20 MG tablet Take 2 tablets (40 mg total) by mouth daily with breakfast. 12/12/14   Rumley, Lora Havens, DO  traZODone (DESYREL) 50 MG tablet Take 50 mg by mouth at bedtime as needed for sleep.    [provider]    Family History Family History  Problem Relation Age of Onset  . Prostate cancer Father        Partial gastrectomy  . Colon cancer Neg Hx   . Anesthesia problems Neg Hx   . Hypotension Neg Hx   . Malignant hyperthermia Neg Hx   . Pseudochol deficiency Neg Hx     Social History Social History   Tobacco Use  . Smoking status: Current Every Day Smoker    Packs/day: 1.00    Years: 31.00    Pack years: 31.00    Types: Cigarettes  . Smokeless tobacco: Never Used  Substance Use Topics  . Alcohol use: No  . Drug use: No     Allergies   Aspirin; Flagyl [metronidazole]; Sudafed [pseudoephedrine hcl]; and Toradol [ketorolac tromethamine]   Review of Systems Review of Systems  Constitutional: Negative for chills and fever.  Cardiovascular: Negative for chest pain.  Musculoskeletal: Positive for arthralgias (left ankle pain) and joint swelling.  Skin: Negative for color change and wound.  Neurological: Negative for dizziness, weakness and numbness.  All other systems reviewed and are negative.    Physical Exam Updated Vital Signs BP 116/62 (BP Location: Right Arm)   Pulse 90   Temp 97.6 F (36.4 C) (Oral)   Resp 19   LMP 03/30/2011   SpO2 98%   Physical Exam  Constitutional: She is oriented to person, place, and time. She appears well-developed and well-nourished. No distress.  HENT:  Head: Normocephalic and atraumatic.  Cardiovascular: Normal rate, regular rhythm, normal heart sounds and intact distal pulses.  DP  pulses intact bilaterally  Pulmonary/Chest: Effort normal and breath sounds normal. No respiratory distress.  Musculoskeletal: She exhibits edema and tenderness.  ttp of the lateral and anterior left ankle and lateral foot with mild edema laterally.  No erythema, open wounds,  No lower leg pain or edema.  Compartments soft.   Neurological: She is alert and oriented to person, place, and time. No sensory deficit. She exhibits normal muscle tone. Coordination normal.  Skin: Skin is warm and dry. Capillary refill takes less than  2 seconds.  Psychiatric: She has a normal mood and affect.  Nursing note and vitals reviewed.    ED Treatments / Results  Labs (all labs ordered are listed, but only abnormal results are displayed) Labs Reviewed - No data to display  EKG  EKG Interpretation None       Radiology Dg Ankle Complete Left  Result Date: 03/11/2017 CLINICAL DATA:  Patient fell yesterday twisting the ankle. Persistent pain and swelling radiating into the sole of the foot. EXAM: LEFT ANKLE COMPLETE - 3+ VIEW COMPARISON:  None in PACs FINDINGS: The bones are subjectively adequately mineralized. The ankle joint mortise is preserved. The talar dome is intact. There is irregularity of the medial malleolus consistent with previous injury or degenerative change. The talus and calcaneus exhibit no acute abnormalities. There are plantar and Achilles region calcaneal spurs. There is lucency through the base of the fifth metatarsal which appears acute consistent with a fracture. IMPRESSION: Probable fracture from the base of the fifth metatarsal. No acute ankle fracture or dislocation. Electronically Signed   By: David  [place]az[place] M.D.   On: 03/11/2017 16:23    Procedures Procedures (including critical care time)  Medications Ordered in ED Medications  oxyCODONE-acetaminophen (PERCOCET/ROXICET) 5-325 MG per tablet 1 tablet (not administered)     Initial Impression / Assessment and Plan / ED  Course  I have reviewed the triage vital signs and the nursing notes.  Pertinent labs & imaging results that were available during my care of the patient were reviewed by me and considered in my medical decision making (see chart for details).     Extremity is NV intact.  Compartments are soft.   XR results discussed.  Cam walker boot applied and crutches given.  Pt prefers to arrange f/u with Dr. Hilda LiasKeeling.      Final Clinical Impressions(s) / ED Diagnoses   Final diagnoses:  Closed fracture of base of metatarsal bone of left foot, initial encounter    ED Discharge Orders    None       Pauline Ausriplett, Jakaiden Fill, PA-C 03/11/17 1650    Donnetta Hutchingook, Brian, MD 03/14/17 1320

## 2017-03-11 NOTE — ED Triage Notes (Signed)
Pt tripped yesterday and c/o left ankle pain. sweliling noted.

## 2017-03-15 ENCOUNTER — Encounter (HOSPITAL_COMMUNITY): Payer: Self-pay | Admitting: Emergency Medicine

## 2017-03-15 ENCOUNTER — Emergency Department (HOSPITAL_COMMUNITY)
Admission: EM | Admit: 2017-03-15 | Discharge: 2017-03-15 | Disposition: A | Discharge status: Home / Self Care | Payer: Medicaid Other | Attending: Emergency Medicine | Admitting: Emergency Medicine

## 2017-03-15 DIAGNOSIS — J449 Chronic obstructive pulmonary disease, unspecified: Secondary | ICD-10-CM | POA: Diagnosis not present

## 2017-03-15 DIAGNOSIS — I129 Hypertensive chronic kidney disease with stage 1 through stage 4 chronic kidney disease, or unspecified chronic kidney disease: Secondary | ICD-10-CM | POA: Insufficient documentation

## 2017-03-15 DIAGNOSIS — N189 Chronic kidney disease, unspecified: Secondary | ICD-10-CM | POA: Insufficient documentation

## 2017-03-15 DIAGNOSIS — S92355D Nondisplaced fracture of fifth metatarsal bone, left foot, subsequent encounter for fracture with routine healing: Secondary | ICD-10-CM

## 2017-03-15 DIAGNOSIS — F17210 Nicotine dependence, cigarettes, uncomplicated: Secondary | ICD-10-CM | POA: Diagnosis not present

## 2017-03-15 DIAGNOSIS — M79672 Pain in left foot: Secondary | ICD-10-CM | POA: Diagnosis present

## 2017-03-15 DIAGNOSIS — Z79899 Other long term (current) drug therapy: Secondary | ICD-10-CM | POA: Insufficient documentation

## 2017-03-15 DIAGNOSIS — W19XXXD Unspecified fall, subsequent encounter: Secondary | ICD-10-CM | POA: Diagnosis not present

## 2017-03-15 MED ORDER — IBUPROFEN 600 MG PO TABS: 600.0000 mg | ORAL_TABLET | Freq: Four times a day (QID) | ORAL | 0 refills | Status: DC | PRN

## 2017-03-15 MED ORDER — OXYCODONE-ACETAMINOPHEN 5-325 MG PO TABS: 1.0000 | ORAL_TABLET | ORAL | 0 refills | Status: DC | PRN

## 2017-03-15 MED ORDER — OXYCODONE-ACETAMINOPHEN 5-325 MG PO TABS
2.0000 | ORAL_TABLET | Freq: Once | ORAL | Status: AC
  Administered 2017-03-15: 2 via ORAL
  Filled 2017-03-15: qty 2

## 2017-03-15 NOTE — ED Notes (Signed)
Pt here for pain control and being out of meds after being denied office visit with Dr Mort SawyersHarrison's office then going to the health department and getting a referral to the health department

## 2017-03-15 NOTE — Discharge Instructions (Signed)
Please continue to wear cam walker boot and follow-up with Dr. Hilda LiasKeeling on Tuesday.  You may use ibuprofen 600 mg every 6 hours for pain, with Percocet for breakthrough pain.  You will have to refer to Dr. Hilda LiasKeeling for any additional pain medication needs.  If you have significantly worsened pain, numbness, tingling or discoloration in the foot please return for sooner evaluation.

## 2017-03-15 NOTE — ED Notes (Signed)
Pt taken to car  Where another FT pt was in the back seat having been recently discharged  Pt SO driving

## 2017-03-15 NOTE — ED Provider Notes (Signed)
[facility] EMERGENCY DEPARTMENT Provider Note   CSN: [id number] Arrival date & time: 03/15/17  1747     History   Chief Complaint Chief Complaint  Patient presents with  . Foot Pain    HPI Yolanda Adams is a 51 y.o. female.  HPI Patient presents complaining of persistent pain in the left foot.  Patient was seen on 10/6 after a fall and diagnosed with a closed fracture of the base of the fifth metatarsal.  Patient was discharged with follow-up with Dr. Hilda LiasKeeling.  Patient reports she has an appointment with Dr. Hilda LiasKeeling on Tuesday, follow-up was delayed because she had to go to the health department first in order to get a referral.  Patient reports she has been out of pain medication since Thursday.  Patient presents today reporting that she is having severe pain in the left foot.  Patient has taken ibuprofen and Tylenol with no relief.  Patient denies any numbness, tingling, or discoloration to the foot.  Patient has on her cam walker boot today and says she has been wearing it regularly.  Reports the Percocet she was discharged with has been helping with her pain.  Patient reports swelling has improved, she has been trying to keep the foot elevated as much as possible.  Past Medical History:  Diagnosis Date  . Anxiety and depression   . Chronic kidney disease    small rt kidney stone  . COPD (chronic obstructive pulmonary disease) (HCC)   . DDD (degenerative disc disease)   . GERD (gastroesophageal reflux disease)   . Helicobacter pylori gastritis June 2011   treated with Pylera  . HTN (hypertension)   . IBS (irritable bowel syndrome)   . Peptic stricture of esophagus    dilated twice 10/2009  . PUD (peptic ulcer disease)   . Renal insufficiency   . Seizures (HCC)    had 1 episode of it about 5 years ago, no other hx of seizures  . Shortness of breath    continuous  . Shoulder pain     Patient Active Problem List   Diagnosis Date Noted  . Melena 03/20/2011  . Right  sided abdominal pain 03/20/2011  . DIARRHEA, CHRONIC 06/05/2010  . ABDOMINAL PAIN, UNSPECIFIED SITE 03/13/2010  . GERD 10/09/2009  . Esophageal dysphagia 10/09/2009  . PUD, HX OF 10/09/2009  . IRRITABLE BOWEL SYNDROME, HX OF 10/09/2009  . CERVICAL SPASM 09/07/2009  . IMPINGEMENT SYNDROME 06/20/2009  . HIGH BLOOD PRESSURE 06/20/2009    Past Surgical History:  Procedure Laterality Date  . ABDOMINAL HYSTERECTOMY    . BLADDER SURGERY     x 3 in [place]-strected twice and had bladder sling placed  . CHOLECYSTECTOMY    . ESOPHAGOGASTRODUODENOSCOPY  10/26/09 GASTRITIS/DUODENITIS   Distal esol stricture/dil 15-16 mm. A 17 mm dilator would not pass  . ILEOColonoscopy  04/15/2011   SLF: Colitis in the sigmoid colon/Internal hemorrhoids  . OOPHORECTOMY     Right  . steroid shot in back    . UPPER GASTROINTESTINAL ENDOSCOPY  JUN 14, 2011DYSPHAGIA, EPIG PAIN   H PYLORI GASTRITIS    OB History    Gravida Para Term Preterm AB Living   3 1 1   2 1    SAB TAB Ectopic Multiple Live Births   1 1             Home Medications    Prior to Admission medications   Medication Sig Start Date End Date Taking? Authorizing Provider  acetaminophen (  TYLENOL EX ST ARTHRITIS PAIN) 500 MG tablet Take 500 mg by mouth every 6 (six) hours as needed for moderate pain.     [provider]  acetaminophen (TYLENOL) 500 MG tablet Take 2 tablets (1,000 mg total) by mouth every 6 (six) hours as needed. Patient taking differently: Take 1,000 mg by mouth every 6 (six) hours as needed for mild pain or moderate pain.  11/28/14   Arby BarrettePfeiffer, Marcy, MD  albuterol (PROVENTIL HFA;VENTOLIN HFA) 108 (90 BASE) MCG/ACT inhaler Inhale 2 puffs into the lungs 4 (four) times daily.     [provider]  citalopram (CELEXA) 40 MG tablet Take 40 mg by mouth daily.    [provider]  diazepam (VALIUM) 10 MG tablet Take 10 mg by mouth 3 (three) times daily.    [provider]  doxycycline (VIBRAMYCIN) 50  MG capsule Take 2 capsules (100 mg total) by mouth 2 (two) times daily. 12/12/14   Rumley, [place] N, DO  gabapentin (NEURONTIN) 400 MG capsule Take 400 mg by mouth 3 (three) times daily.    [provider]  ibuprofen (ADVIL,MOTRIN) 200 MG tablet Take 400 mg by mouth every 6 (six) hours as needed for moderate pain.     [provider]  ibuprofen (ADVIL,MOTRIN) 600 MG tablet Take 1 tablet (600 mg total) every 6 (six) hours as needed by mouth. 03/15/17   Dartha LodgeFord, Shamira Toutant N, PA-C  orphenadrine (NORFLEX) 100 MG tablet Take 1 tablet (100 mg total) by mouth 2 (two) times daily. Patient not taking: Reported on 12/12/2014 11/28/14   Arby BarrettePfeiffer, Marcy, MD  oxyCODONE-acetaminophen (PERCOCET) 5-325 MG tablet Take 1 tablet every 4 (four) hours as needed by mouth. 03/15/17   Dartha LodgeFord, Courtenay Creger N, PA-C  oxyCODONE-acetaminophen (PERCOCET/ROXICET) 5-325 MG tablet Take 1 tablet every 4 (four) hours as needed by mouth. 03/11/17   Triplett, Tammy, PA-C  predniSONE (DELTASONE) 20 MG tablet Take 2 tablets (40 mg total) by mouth daily with breakfast. 12/12/14   Rumley, [place] N, DO  traZODone (DESYREL) 50 MG tablet Take 50 mg by mouth at bedtime as needed for sleep.    [provider]    Family History Family History  Problem Relation Age of Onset  . Prostate cancer Father        Partial gastrectomy  . Colon cancer Neg Hx   . Anesthesia problems Neg Hx   . Hypotension Neg Hx   . Malignant hyperthermia Neg Hx   . Pseudochol deficiency Neg Hx     Social History Social History   Tobacco Use  . Smoking status: Current Every Day Smoker    Packs/day: 1.00    Years: 41.00    Pack years: 41.00    Types: Cigarettes  . Smokeless tobacco: Never Used  Substance Use Topics  . Alcohol use: No  . Drug use: No     Allergies   Aspirin; Flagyl [metronidazole]; Sudafed [pseudoephedrine hcl]; and Toradol [ketorolac tromethamine]   Review of Systems Review of Systems  Constitutional: Negative for chills  and fever.  Musculoskeletal: Positive for arthralgias (L foot) and joint swelling. Negative for gait problem.  Skin: Negative for color change and wound.  Neurological: Negative for weakness and numbness.     Physical Exam Updated Vital Signs BP (!) 147/98 (BP Location: Right Arm)   Pulse 80   Temp 97.8 F (36.6 C) (Oral)   Resp 16   Ht 5\' 2"  (1.575 m)   Wt 81.6 kg (180 lb)   LMP 03/30/2011  SpO2 96%   BMI 32.92 kg/m   Physical Exam  Constitutional: She appears well-developed and well-nourished. No distress.  HENT:  Head: Normocephalic and atraumatic.  Eyes: Right eye exhibits no discharge. Left eye exhibits no discharge.  Pulmonary/Chest: Effort normal. No respiratory distress.  Musculoskeletal:  Tenderness to palpation over the base of fifth metatarsal of left foot, minimal swelling, no erythema or warmth, DP and TP pulses are 2+, good capillary refill, sensation is intact, all compartments are soft, ROM limited by pain but pt able to dorsiflex and plantarflex  Neurological: She is alert. Coordination normal.  Skin: Skin is warm and dry. Capillary refill takes less than 2 seconds. She is not diaphoretic.  Psychiatric: She has a normal mood and affect. Her behavior is normal.  Nursing note and vitals reviewed.    ED Treatments / Results  Labs (all labs ordered are listed, but only abnormal results are displayed) Labs Reviewed - No data to display  EKG  EKG Interpretation None       Radiology No results found.  Procedures Procedures (including critical care time)  Medications Ordered in ED Medications  oxyCODONE-acetaminophen (PERCOCET/ROXICET) 5-325 MG per tablet 2 tablet (2 tablets Oral Given 03/15/17 1903)     Initial Impression / Assessment and Plan / ED Course  I have reviewed the triage vital signs and the nursing notes.  Pertinent labs & imaging results that were available during my care of the patient were reviewed by me and considered in my  medical decision making (see chart for details).  Presents with pain in the left foot, was diagnosed with a fracture of fifth metatarsal last week.  Patient has follow-up appointment with Dr. Hilda Lias with orthopedics in 2 days but ran out of pain medication, pt remains in cam walker boot. Patient has minimal swelling at the base of the fifth metatarsal, no bruising or discoloration, foot is neurovascularly intact in all compartments are soft.  Will treat pain here in the ED and provide a few Percocet to bridge patient to seeing Dr. Hilda Lias on Tuesday.  On re-eval patient reports pain is starting to ease off and improve.  Discussed with patient taking NSAIDs scheduled every 6 hours, with Percocet as needed for breakthrough pain.  Discussed with patient that she will not be provided any additional pain medication through the emergency department, and she will have to see Dr. Hilda Lias for continued pain management.   Final Clinical Impressions(s) / ED Diagnoses   Final diagnoses:  Closed nondisplaced fracture of fifth metatarsal bone of left foot with routine healing, subsequent encounter  Foot pain, left    ED Discharge Orders        Ordered    oxyCODONE-acetaminophen (PERCOCET) 5-325 MG tablet  Every 4 hours PRN     03/15/17 1944    ibuprofen (ADVIL,MOTRIN) 600 MG tablet  Every 6 hours PRN     03/15/17 1944       Dartha Lodge, PA-C 03/16/17 0306    Samuel Jester, DO 03/18/17 1005

## 2017-03-15 NOTE — ED Notes (Signed)
Pt sitting on side of bed texting on phone  Reports no pain relief

## 2017-03-15 NOTE — ED Notes (Signed)
Pt spouse out of room with demand for shot or something for pain

## 2017-03-15 NOTE — ED Triage Notes (Signed)
Patient c/o left foot pain. Patient seen here in ED on Tuesday after injuring foot. Patient diagnosed with fracture. Patient has cam walker, crutches, and percocet. Patient took last dose of percocet on Thursday in which she denied any improvement. Patient given referral to Dr Romeo AppleHarrison. Per patient orthopedic would not honor referral due to insurance. Patient had to be seen at health department and given new referral to orthopedic. Patient has appointment with Dr Romeo AppleHarrison on Tuesday.

## 2017-03-15 NOTE — ED Notes (Signed)
KF in to assess 

## 2017-03-18 ENCOUNTER — Encounter: Payer: Self-pay | Admitting: Orthopaedic Surgery

## 2017-03-18 ENCOUNTER — Ambulatory Visit (INDEPENDENT_AMBULATORY_CARE_PROVIDER_SITE_OTHER): Payer: Medicaid Other

## 2017-03-18 ENCOUNTER — Ambulatory Visit: Payer: Medicaid Other | Admitting: Orthopaedic Surgery

## 2017-03-18 VITALS — BP 146/77 | HR 82 | Temp 96.8°F | Ht 62.0 in | Wt 181.0 lb

## 2017-03-18 DIAGNOSIS — S92355B Nondisplaced fracture of fifth metatarsal bone, left foot, initial encounter for open fracture: Secondary | ICD-10-CM | POA: Diagnosis not present

## 2017-03-18 DIAGNOSIS — S92355A Nondisplaced fracture of fifth metatarsal bone, left foot, initial encounter for closed fracture: Secondary | ICD-10-CM | POA: Diagnosis not present

## 2017-03-18 DIAGNOSIS — F17210 Nicotine dependence, cigarettes, uncomplicated: Secondary | ICD-10-CM

## 2017-03-18 NOTE — Progress Notes (Signed)
Subjective:    Patient ID: Yolanda Adams, female    DOB: 29-Aug-1965, 51 y.o.   MRN: [id number]  HPI She fell and twisted her left foot and ankle on 03-10-17.  She went to the ER the next day and had x-rays.  She was noted to have a fracture of the base of the left fifth metatarsal.  She was given crutches and CAM walker.  She had no other injury.  She continues to have pain of the left foot.  She has no redness.  She has taken Tylenol, Advil and her pain medicine.  She smokes and is not willing to stop.  Review of Systems  HENT: Negative for congestion.   Respiratory: Positive for shortness of breath. Negative for cough.   Cardiovascular: Negative for chest pain and leg swelling.  Endocrine: Positive for cold intolerance.  Musculoskeletal: Positive for arthralgias, gait problem and joint swelling.  Allergic/Immunologic: Positive for environmental allergies.  Psychiatric/Behavioral: The patient is nervous/anxious.    Past Medical History:  Diagnosis Date  . Anxiety and depression   . Chronic kidney disease    small rt kidney stone  . COPD (chronic obstructive pulmonary disease) (HCC)   . DDD (degenerative disc disease)   . GERD (gastroesophageal reflux disease)   . Helicobacter pylori gastritis June 2011   treated with Pylera  . HTN (hypertension)   . IBS (irritable bowel syndrome)   . Peptic stricture of esophagus    dilated twice 10/2009  . PUD (peptic ulcer disease)   . Renal insufficiency   . Seizures (HCC)    had 1 episode of it about 5 years ago, no other hx of seizures  . Shortness of breath    continuous  . Shoulder pain     Past Surgical History:  Procedure Laterality Date  . ABDOMINAL HYSTERECTOMY    . BLADDER SURGERY     x 3 in [place]-strected twice and had bladder sling placed  . CHOLECYSTECTOMY    . ESOPHAGOGASTRODUODENOSCOPY  10/26/09 GASTRITIS/DUODENITIS   Distal esol stricture/dil 15-16 mm. A 17 mm dilator would not pass  . ILEOColonoscopy  04/15/2011   SLF: Colitis in the sigmoid colon/Internal hemorrhoids  . OOPHORECTOMY     Right  . steroid shot in back    . UPPER GASTROINTESTINAL ENDOSCOPY  JUN 14, 2011DYSPHAGIA, EPIG PAIN   H PYLORI GASTRITIS    Current Outpatient Medications on File Prior to Visit  Medication Sig Dispense Refill  . acetaminophen (TYLENOL EX ST ARTHRITIS PAIN) 500 MG tablet Take 500 mg by mouth every 6 (six) hours as needed for moderate pain.     Marland Kitchen. acetaminophen (TYLENOL) 500 MG tablet Take 2 tablets (1,000 mg total) by mouth every 6 (six) hours as needed. (Patient taking differently: Take 1,000 mg by mouth every 6 (six) hours as needed for mild pain or moderate pain. ) 30 tablet 0  . albuterol (PROVENTIL HFA;VENTOLIN HFA) 108 (90 BASE) MCG/ACT inhaler Inhale 2 puffs into the lungs 4 (four) times daily.     . citalopram (CELEXA) 40 MG tablet Take 40 mg by mouth daily.    . diazepam (VALIUM) 10 MG tablet Take 10 mg by mouth 3 (three) times daily.    Marland Kitchen. doxycycline (VIBRAMYCIN) 50 MG capsule Take 2 capsules (100 mg total) by mouth 2 (two) times daily. 28 capsule 0  . gabapentin (NEURONTIN) 400 MG capsule Take 400 mg by mouth 3 (three) times daily.    Marland Kitchen. ibuprofen (ADVIL,MOTRIN) 200 MG tablet  Take 400 mg by mouth every 6 (six) hours as needed for moderate pain.     Marland Kitchen ibuprofen (ADVIL,MOTRIN) 600 MG tablet Take 1 tablet (600 mg total) every 6 (six) hours as needed by mouth. 30 tablet 0  . orphenadrine (NORFLEX) 100 MG tablet Take 1 tablet (100 mg total) by mouth 2 (two) times daily. (Patient not taking: Reported on 12/12/2014) 30 tablet 0  . oxyCODONE-acetaminophen (PERCOCET) 5-325 MG tablet Take 1 tablet every 4 (four) hours as needed by mouth. 10 tablet 0  . oxyCODONE-acetaminophen (PERCOCET/ROXICET) 5-325 MG tablet Take 1 tablet every 4 (four) hours as needed by mouth. 15 tablet 0  . predniSONE (DELTASONE) 20 MG tablet Take 2 tablets (40 mg total) by mouth daily with breakfast. 4 tablet 0  . traZODone (DESYREL) 50 MG tablet  Take 50 mg by mouth at bedtime as needed for sleep.     No current facility-administered medications on file prior to visit.     Social History   Socioeconomic History  . Marital status: Widowed    Spouse name: Not on file  . Number of children: 1  . Years of education: Not on file  . Highest education level: Not on file  Social Needs  . Financial resource strain: Not on file  . Food insecurity - worry: Not on file  . Food insecurity - inability: Not on file  . Transportation needs - medical: Not on file  . Transportation needs - non-medical: Not on file  Occupational History    Employer: UNEMPLOYED  Tobacco Use  . Smoking status: Current Every Day Smoker    Packs/day: 1.00    Years: 41.00    Pack years: 41.00    Types: Cigarettes  . Smokeless tobacco: Never Used  Substance and Sexual Activity  . Alcohol use: No  . Drug use: No  . Sexual activity: No    Birth control/protection: Surgical  Other Topics Concern  . Not on file  Social History Narrative  . Not on file    Family History  Problem Relation Age of Onset  . Prostate cancer Father        Partial gastrectomy  . Hypertension Father   . Stroke Father   . Colon cancer Neg Hx   . Anesthesia problems Neg Hx   . Hypotension Neg Hx   . Malignant hyperthermia Neg Hx   . Pseudochol deficiency Neg Hx     BP (!) 146/77   Pulse 82   Temp (!) 96.8 F (36 C)   Ht 5\' 2"  (1.575 m)   Wt 181 lb (82.1 kg)   LMP 03/30/2011   BMI 33.11 kg/m       Objective:   Physical Exam  Constitutional: She is oriented to person, place, and time. She appears well-developed and well-nourished.  HENT:  Head: Normocephalic and atraumatic.  Eyes: Conjunctivae and EOM are normal. Pupils are equal, round, and reactive to light.  Neck: Normal range of motion. Neck supple.  Cardiovascular: Normal rate, regular rhythm and intact distal pulses.  Pulmonary/Chest: Effort normal.  Abdominal: Soft.  Musculoskeletal: She exhibits  tenderness (left foot tender base of the fifth metatarsal, no redness, some swelling of lateral ankle, no redness.  NV intact.).  Neurological: She is alert and oriented to person, place, and time. She displays normal reflexes. No cranial nerve deficit. She exhibits normal muscle tone. Coordination normal.  Skin: Skin is warm and dry.  Psychiatric: She has a normal mood and affect. Her behavior  is normal. Judgment and thought content normal.  Vitals reviewed.   X-rays were done of the left foot, reported separately.      Assessment & Plan:   Encounter Diagnoses  Name Primary?  . Closed nondisplaced fracture of fifth metatarsal bone of left foot, initial encounter Yes  . Cigarette nicotine dependence without complication    Continue CAM walker, crutches.  Tylenol and Advil for pain.  Contrast bath sheet of instructions given.  Return in two weeks.  Call if any problem.  Precautions discussed.   X-rays on return.  Electronically Signed Darreld McleanWayne Aryani Daffern, MD 11/13/20183:20 PM

## 2017-03-18 NOTE — Patient Instructions (Signed)
Steps to Quit Smoking Smoking tobacco can be bad for your health. It can also affect almost every organ in your body. Smoking puts you and people around you at risk for many serious Rontavious Albright-lasting (chronic) diseases. Quitting smoking is hard, but it is one of the best things that you can do for your health. It is never too late to quit. What are the benefits of quitting smoking? When you quit smoking, you lower your risk for getting serious diseases and conditions. They can include:  Lung cancer or lung disease.  Heart disease.  Stroke.  Heart attack.  Not being able to have children (infertility).  Weak bones (osteoporosis) and broken bones (fractures).  If you have coughing, wheezing, and shortness of breath, those symptoms may get better when you quit. You may also get sick less often. If you are pregnant, quitting smoking can help to lower your chances of having a baby of low birth weight. What can I do to help me quit smoking? Talk with your doctor about what can help you quit smoking. Some things you can do (strategies) include:  Quitting smoking totally, instead of slowly cutting back how much you smoke over a period of time.  Going to in-person counseling. You are more likely to quit if you go to many counseling sessions.  Using resources and support systems, such as: ? Online chats with a counselor. ? Phone quitlines. ? Printed self-help materials. ? Support groups or group counseling. ? Text messaging programs. ? Mobile phone apps or applications.  Taking medicines. Some of these medicines may have nicotine in them. If you are pregnant or breastfeeding, do not take any medicines to quit smoking unless your doctor says it is okay. Talk with your doctor about counseling or other things that can help you.  Talk with your doctor about using more than one strategy at the same time, such as taking medicines while you are also going to in-person counseling. This can help make  quitting easier. What things can I do to make it easier to quit? Quitting smoking might feel very hard at first, but there is a lot that you can do to make it easier. Take these steps:  Talk to your family and friends. Ask them to support and encourage you.  Call phone quitlines, reach out to support groups, or work with a counselor.  Ask people who smoke to not smoke around you.  Avoid places that make you want (trigger) to smoke, such as: ? Bars. ? Parties. ? Smoke-break areas at work.  Spend time with people who do not smoke.  Lower the stress in your life. Stress can make you want to smoke. Try these things to help your stress: ? Getting regular exercise. ? Deep-breathing exercises. ? Yoga. ? Meditating. ? Doing a body scan. To do this, close your eyes, focus on one area of your body at a time from head to toe, and notice which parts of your body are tense. Try to relax the muscles in those areas.  Download or buy apps on your mobile phone or tablet that can help you stick to your quit plan. There are many free apps, such as QuitGuide from the CDC (Centers for Disease Control and Prevention). You can find more support from smokefree.gov and other websites.  This information is not intended to replace advice given to you by your health care provider. Make sure you discuss any questions you have with your health care provider. Document Released: 02/16/2009 Document   Revised: 12/19/2015 Document Reviewed: 09/06/2014 Elsevier Interactive Patient Education  2018 [employer] Inc.  

## 2017-03-24 ENCOUNTER — Telehealth: Payer: Self-pay | Admitting: Orthopaedic Surgery

## 2017-03-24 ENCOUNTER — Ambulatory Visit: Payer: Self-pay | Admitting: Family Medicine

## 2017-03-24 NOTE — Telephone Encounter (Signed)
Patient called, states has no more pain medication, said what Dr Hilda LiasKeeling asked her to use is not helping her foot - said "it is killing her." Please advise.

## 2017-03-25 NOTE — Telephone Encounter (Signed)
Relayed to patient.

## 2017-03-25 NOTE — Telephone Encounter (Signed)
Tylenol, Advil or Aleve.  Try one Tylenol plus one Advil at a time for pain up to four times a day.

## 2017-04-01 ENCOUNTER — Ambulatory Visit: Payer: Medicaid Other | Admitting: Orthopaedic Surgery

## 2017-04-01 ENCOUNTER — Encounter: Payer: Self-pay | Admitting: Orthopaedic Surgery

## 2017-04-15 ENCOUNTER — Emergency Department (HOSPITAL_COMMUNITY): Payer: Medicaid Other

## 2017-04-15 ENCOUNTER — Encounter (HOSPITAL_COMMUNITY): Payer: Self-pay | Admitting: *Deleted

## 2017-04-15 ENCOUNTER — Emergency Department (HOSPITAL_COMMUNITY)
Admission: EM | Admit: 2017-04-15 | Discharge: 2017-04-15 | Disposition: A | Discharge status: Home / Self Care | Payer: Medicaid Other | Attending: Emergency Medicine | Admitting: Emergency Medicine

## 2017-04-15 ENCOUNTER — Other Ambulatory Visit: Payer: Self-pay

## 2017-04-15 DIAGNOSIS — Y999 Unspecified external cause status: Secondary | ICD-10-CM | POA: Diagnosis not present

## 2017-04-15 DIAGNOSIS — Y929 Unspecified place or not applicable: Secondary | ICD-10-CM | POA: Diagnosis not present

## 2017-04-15 DIAGNOSIS — F17210 Nicotine dependence, cigarettes, uncomplicated: Secondary | ICD-10-CM | POA: Insufficient documentation

## 2017-04-15 DIAGNOSIS — Y939 Activity, unspecified: Secondary | ICD-10-CM | POA: Insufficient documentation

## 2017-04-15 DIAGNOSIS — S6290XA Unspecified fracture of unspecified wrist and hand, initial encounter for closed fracture: Secondary | ICD-10-CM | POA: Insufficient documentation

## 2017-04-15 DIAGNOSIS — W000XXA Fall on same level due to ice and snow, initial encounter: Secondary | ICD-10-CM | POA: Insufficient documentation

## 2017-04-15 DIAGNOSIS — J449 Chronic obstructive pulmonary disease, unspecified: Secondary | ICD-10-CM | POA: Diagnosis not present

## 2017-04-15 DIAGNOSIS — Z79899 Other long term (current) drug therapy: Secondary | ICD-10-CM | POA: Diagnosis not present

## 2017-04-15 DIAGNOSIS — S62102A Fracture of unspecified carpal bone, left wrist, initial encounter for closed fracture: Secondary | ICD-10-CM

## 2017-04-15 MED ORDER — HYDROCODONE-ACETAMINOPHEN 5-325 MG PO TABS
2.0000 | ORAL_TABLET | Freq: Once | ORAL | Status: AC
  Administered 2017-04-15: 2 via ORAL
  Filled 2017-04-15: qty 2

## 2017-04-15 MED ORDER — HYDROCODONE-ACETAMINOPHEN 5-325 MG PO TABS: 2.0000 | ORAL_TABLET | ORAL | 0 refills | Status: DC | PRN

## 2017-04-15 NOTE — ED Notes (Signed)
Pt alert & oriented x4, stable gait. Patient given discharge instructions, paperwork & prescription(s). Patient informed not to drive, operate any equipment & handel any important documents 4 hours after taking pain medication. Patient  instructed to stop at the registration desk to finish any additional paperwork. Patient  verbalized understanding. Pt left department w/ no further questions. 

## 2017-04-15 NOTE — ED Provider Notes (Signed)
[facility] EMERGENCY DEPARTMENT Provider Note   CSN: [id number] Arrival date & time: 04/15/17  1902     History   Chief Complaint Chief Complaint  Patient presents with  . Fall    HPI Yolanda Adams is a 51 y.o. female.  The history is provided by the patient. No language interpreter was used.  Fall  This is a new problem. The problem occurs constantly. The problem has been gradually worsening. Pertinent negatives include no chest pain and no shortness of breath. Nothing aggravates the symptoms. Nothing relieves the symptoms. She has tried nothing for the symptoms. The treatment provided no relief.  Pt complains of pain in her wrist, forearm and upper arm after falling.   Pt reports she slipped in the snow.   Pt is wearing a cam walker for a foot fracture.  She is seeing Dr. Hilda Lias Past Medical History:  Diagnosis Date  . Anxiety and depression   . Chronic kidney disease    small rt kidney stone  . COPD (chronic obstructive pulmonary disease) (HCC)   . DDD (degenerative disc disease)   . GERD (gastroesophageal reflux disease)   . Helicobacter pylori gastritis June 2011   treated with Pylera  . HTN (hypertension)   . IBS (irritable bowel syndrome)   . Peptic stricture of esophagus    dilated twice 10/2009  . PUD (peptic ulcer disease)   . Renal insufficiency   . Seizures (HCC)    had 1 episode of it about 5 years ago, no other hx of seizures  . Shortness of breath    continuous  . Shoulder pain     Patient Active Problem List   Diagnosis Date Noted  . Melena 03/20/2011  . Right sided abdominal pain 03/20/2011  . DIARRHEA, CHRONIC 06/05/2010  . ABDOMINAL PAIN, UNSPECIFIED SITE 03/13/2010  . GERD 10/09/2009  . Esophageal dysphagia 10/09/2009  . PUD, HX OF 10/09/2009  . IRRITABLE BOWEL SYNDROME, HX OF 10/09/2009  . CERVICAL SPASM 09/07/2009  . IMPINGEMENT SYNDROME 06/20/2009  . HIGH BLOOD PRESSURE 06/20/2009    Past Surgical History:  Procedure Laterality  Date  . ABDOMINAL HYSTERECTOMY    . BLADDER SURGERY     x 3 in [place]-strected twice and had bladder sling placed  . CHOLECYSTECTOMY    . ESOPHAGOGASTRODUODENOSCOPY  10/26/09 GASTRITIS/DUODENITIS   Distal esol stricture/dil 15-16 mm. A 17 mm dilator would not pass  . ILEOColonoscopy  04/15/2011   SLF: Colitis in the sigmoid colon/Internal hemorrhoids  . OOPHORECTOMY     Right  . SAVORY DILATION  04/15/2011   WUJ:[id number] gastritis/Duodenitis  . steroid shot in back    . UPPER GASTROINTESTINAL ENDOSCOPY  JUN 14, 2011DYSPHAGIA, EPIG PAIN   H PYLORI GASTRITIS    OB History    Gravida Para Term Preterm AB Living   3 1 1   2 1    SAB TAB Ectopic Multiple Live Births   1 1             Home Medications    Prior to Admission medications   Medication Sig Start Date End Date Taking? Authorizing Provider  acetaminophen (TYLENOL EX ST ARTHRITIS PAIN) 500 MG tablet Take 500 mg by mouth every 6 (six) hours as needed for moderate pain.     [provider]  acetaminophen (TYLENOL) 500 MG tablet Take 2 tablets (1,000 mg total) by mouth every 6 (six) hours as needed. Patient taking differently: Take 1,000 mg by mouth every 6 (six) hours  as needed for mild pain or moderate pain.  11/28/14   Arby BarrettePfeiffer, Marcy, MD  albuterol (PROVENTIL HFA;VENTOLIN HFA) 108 (90 BASE) MCG/ACT inhaler Inhale 2 puffs into the lungs 4 (four) times daily.     [provider]  citalopram (CELEXA) 40 MG tablet Take 40 mg by mouth daily.    [provider]  diazepam (VALIUM) 10 MG tablet Take 10 mg by mouth 3 (three) times daily.    [provider]  doxycycline (VIBRAMYCIN) 50 MG capsule Take 2 capsules (100 mg total) by mouth 2 (two) times daily. 12/12/14   Rumley, [place] N, DO  gabapentin (NEURONTIN) 400 MG capsule Take 400 mg by mouth 3 (three) times daily.    [provider]  HYDROcodone-acetaminophen (NORCO/VICODIN) 5-325 MG tablet Take 2 tablets by mouth every 4 (four) hours as  needed. 04/15/17   Elson AreasSofia, Yaeko Fazekas K, PA-C  ibuprofen (ADVIL,MOTRIN) 200 MG tablet Take 400 mg by mouth every 6 (six) hours as needed for moderate pain.     [provider]  ibuprofen (ADVIL,MOTRIN) 600 MG tablet Take 1 tablet (600 mg total) every 6 (six) hours as needed by mouth. 03/15/17   Dartha LodgeFord, Kelsey N, PA-C  orphenadrine (NORFLEX) 100 MG tablet Take 1 tablet (100 mg total) by mouth 2 (two) times daily. Patient not taking: Reported on 12/12/2014 11/28/14   Arby BarrettePfeiffer, Marcy, MD  oxyCODONE-acetaminophen (PERCOCET) 5-325 MG tablet Take 1 tablet every 4 (four) hours as needed by mouth. 03/15/17   Dartha LodgeFord, Kelsey N, PA-C  oxyCODONE-acetaminophen (PERCOCET/ROXICET) 5-325 MG tablet Take 1 tablet every 4 (four) hours as needed by mouth. 03/11/17   Triplett, Tammy, PA-C  predniSONE (DELTASONE) 20 MG tablet Take 2 tablets (40 mg total) by mouth daily with breakfast. 12/12/14   Rumley, [place] N, DO  traZODone (DESYREL) 50 MG tablet Take 50 mg by mouth at bedtime as needed for sleep.    [provider]    Family History Family History  Problem Relation Age of Onset  . Prostate cancer Father        Partial gastrectomy  . Hypertension Father   . Stroke Father   . Colon cancer Neg Hx   . Anesthesia problems Neg Hx   . Hypotension Neg Hx   . Malignant hyperthermia Neg Hx   . Pseudochol deficiency Neg Hx     Social History Social History   Tobacco Use  . Smoking status: Current Every Day Smoker    Packs/day: 1.00    Years: 41.00    Pack years: 41.00    Types: Cigarettes  . Smokeless tobacco: Never Used  Substance Use Topics  . Alcohol use: No  . Drug use: No     Allergies   Aspirin; Flagyl [metronidazole]; Sudafed [pseudoephedrine hcl]; and Toradol [ketorolac tromethamine]   Review of Systems Review of Systems  Respiratory: Negative for shortness of breath.   Cardiovascular: Negative for chest pain.  Musculoskeletal: Positive for joint swelling and myalgias.  All other  systems reviewed and are negative.    Physical Exam Updated Vital Signs BP 140/68 (BP Location: Left Arm)   Pulse 88   Temp 97.7 F (36.5 C) (Oral)   Resp 20   Ht 5\' 2"  (1.575 m)   Wt 77.1 kg (170 lb)   LMP 03/30/2011   SpO2 96%   BMI 31.09 kg/m   Physical Exam  Constitutional: She appears well-developed and well-nourished.  HENT:  Head: Normocephalic.  Musculoskeletal: She exhibits tenderness.  Tender right wrist, pain  with movement,  Tender humerus mid shaft to shoulder,   Neurological: She is alert.  Skin: Skin is warm.  Psychiatric: She has a normal mood and affect.  Nursing note and vitals reviewed.    ED Treatments / Results  Labs (all labs ordered are listed, but only abnormal results are displayed) Labs Reviewed - No data to display  EKG  EKG Interpretation None       Radiology Dg Forearm Right  Result Date: 04/15/2017 CLINICAL DATA:  51 year old female status post fall on ice landing on right side. Pain. EXAM: RIGHT FOREARM - 2 VIEW COMPARISON:  Right wrist and right humerus series today. FINDINGS: Alignment at the right elbow appears normal and no right elbow joint effusion is identified. The proximal radius and ulna appear intact. Right radius and ulna midshaft intact. Ulnar styloid fracture re - demonstrated. Once again no definite distal radius fracture is identified, but there is a small ossific fragment dorsal to the carpal bones. See wrist series today. IMPRESSION: 1. See right wrist series today regarding fractures of the ulnar styloid and carpal bone. 2. No superimposed acute fracture or dislocation in the right forearm. Electronically Signed   By: Odessa FlemingH  Hall M.D.   On: 04/15/2017 20:30   Dg Wrist Complete Right  Result Date: 04/15/2017 CLINICAL DATA:  51 year old female status post fall on ice landing on right side. Pain. EXAM: RIGHT WRIST - COMPLETE 3+ VIEW COMPARISON:  None. FINDINGS: Soft tissue swelling about the right wrist. Mildly displaced  ulnar styloid fracture. There is also a small ossific fragment dorsal to the carpal bones on the lateral view. No fracture of the distal right radius is identified. Carpal bone alignment is maintained. The scaphoid appears intact with mild degenerative spurring. Visible metacarpals appear intact. IMPRESSION: 1. Mildly displaced right ulnar styloid fracture. 2. Small ossific fragment dorsal to the carpal bones suspicious for acute triquetrum fracture. 3. Generalized soft tissue swelling. No definite distal radius or other acute fracture about the right wrist. Electronically Signed   By: Odessa FlemingH  Hall M.D.   On: 04/15/2017 20:28   Dg Humerus Right  Result Date: 04/15/2017 CLINICAL DATA:  51 year old female status post fall on ice landing on right side. Pain. EXAM: RIGHT HUMERUS - 2+ VIEW COMPARISON:  Right shoulder series [id number]. FINDINGS: Bone mineralization is within normal limits. Ossific fragment along the superior right acromioclavicular joint is chronic. Alignment at the right shoulder and elbow appears maintained. The right humerus appears intact. Visible right lateral ribs appear intact. IMPRESSION: No acute fracture or dislocation identified about the right humerus. Electronically Signed   By: Odessa FlemingH  Hall M.D.   On: 04/15/2017 20:25    Procedures Procedures (including critical care time)  Medications Ordered in ED Medications  HYDROcodone-acetaminophen (NORCO/VICODIN) 5-325 MG per tablet 2 tablet (2 tablets Oral Given 04/15/17 2020)     Initial Impression / Assessment and Plan / ED Course  I have reviewed the triage vital signs and the nursing notes.  Pertinent labs & imaging results that were available during my care of the patient were reviewed by me and considered in my medical decision making (see chart for details).     Pt placed in a wrist splint, hydrocodone for pain.   Pt advised to call Dr. Hilda LiasKeeling to be seen for evaluation   Final Clinical Impressions(s) / ED Diagnoses   Final  diagnoses:  Closed fracture of left wrist, initial encounter    ED Discharge Orders  Ordered    HYDROcodone-acetaminophen (NORCO/VICODIN) 5-325 MG tablet  Every 4 hours PRN     04/15/17 2048    An After Visit Summary was printed and given to the patient.   Osie Cheeks 04/15/17 2124    Maia Plan, MD 04/16/17 [phone]

## 2017-04-15 NOTE — ED Triage Notes (Signed)
Pt states slipped on ice landing on her right arm.

## 2017-04-21 ENCOUNTER — Telehealth: Payer: Self-pay | Admitting: Orthopaedic Surgery

## 2017-04-21 NOTE — Telephone Encounter (Signed)
Patient called, had left message regarding new injury "fracture of wrist" [facility]; said had fallen on ice. Offered appointment - [facility], per [facility]. Patient to call back to confirm all information and appointment.

## 2017-04-24 ENCOUNTER — Encounter: Payer: Self-pay | Admitting: Orthopaedic Surgery

## 2017-04-24 ENCOUNTER — Ambulatory Visit: Payer: Medicaid Other | Admitting: Orthopaedic Surgery

## 2017-04-24 ENCOUNTER — Ambulatory Visit (INDEPENDENT_AMBULATORY_CARE_PROVIDER_SITE_OTHER): Payer: Medicaid Other

## 2017-04-24 VITALS — BP 114/73 | HR 92 | Temp 96.9°F | Ht 62.0 in | Wt 180.0 lb

## 2017-04-24 DIAGNOSIS — S92355D Nondisplaced fracture of fifth metatarsal bone, left foot, subsequent encounter for fracture with routine healing: Secondary | ICD-10-CM

## 2017-04-24 DIAGNOSIS — S52611A Displaced fracture of right ulna styloid process, initial encounter for closed fracture: Secondary | ICD-10-CM | POA: Diagnosis not present

## 2017-04-24 DIAGNOSIS — F17210 Nicotine dependence, cigarettes, uncomplicated: Secondary | ICD-10-CM

## 2017-04-24 DIAGNOSIS — S62114A Nondisplaced fracture of triquetrum [cuneiform] bone, right wrist, initial encounter for closed fracture: Secondary | ICD-10-CM | POA: Diagnosis not present

## 2017-04-24 MED ORDER — HYDROCODONE-ACETAMINOPHEN 5-325 MG PO TABS: ORAL_TABLET | ORAL | 0 refills | Status: DC

## 2017-04-24 NOTE — Patient Instructions (Addendum)
Steps to Quit Smoking Smoking tobacco can be bad for your health. It can also affect almost every organ in your body. Smoking puts you and people around you at risk for many serious Yolanda Adams-lasting (chronic) diseases. Quitting smoking is hard, but it is one of the best things that you can do for your health. It is never too late to quit. What are the benefits of quitting smoking? When you quit smoking, you lower your risk for getting serious diseases and conditions. They can include:  Lung cancer or lung disease.  Heart disease.  Stroke.  Heart attack.  Not being able to have children (infertility).  Weak bones (osteoporosis) and broken bones (fractures).  If you have coughing, wheezing, and shortness of breath, those symptoms may get better when you quit. You may also get sick less often. If you are pregnant, quitting smoking can help to lower your chances of having a baby of low birth weight. What can I do to help me quit smoking? Talk with your doctor about what can help you quit smoking. Some things you can do (strategies) include:  Quitting smoking totally, instead of slowly cutting back how much you smoke over a period of time.  Going to in-person counseling. You are more likely to quit if you go to many counseling sessions.  Using resources and support systems, such as: ? Online chats with a counselor. ? Phone quitlines. ? Printed self-help materials. ? Support groups or group counseling. ? Text messaging programs. ? Mobile phone apps or applications.  Taking medicines. Some of these medicines may have nicotine in them. If you are pregnant or breastfeeding, do not take any medicines to quit smoking unless your doctor says it is okay. Talk with your doctor about counseling or other things that can help you.  Talk with your doctor about using more than one strategy at the same time, such as taking medicines while you are also going to in-person counseling. This can help make  quitting easier. What things can I do to make it easier to quit? Quitting smoking might feel very hard at first, but there is a lot that you can do to make it easier. Take these steps:  Talk to your family and friends. Ask them to support and encourage you.  Call phone quitlines, reach out to support groups, or work with a counselor.  Ask people who smoke to not smoke around you.  Avoid places that make you want (trigger) to smoke, such as: ? Bars. ? Parties. ? Smoke-break areas at work.  Spend time with people who do not smoke.  Lower the stress in your life. Stress can make you want to smoke. Try these things to help your stress: ? Getting regular exercise. ? Deep-breathing exercises. ? Yoga. ? Meditating. ? Doing a body scan. To do this, close your eyes, focus on one area of your body at a time from head to toe, and notice which parts of your body are tense. Try to relax the muscles in those areas.  Download or buy apps on your mobile phone or tablet that can help you stick to your quit plan. There are many free apps, such as QuitGuide from the CDC (Centers for Disease Control and Prevention). You can find more support from smokefree.gov and other websites.  This information is not intended to replace advice given to you by your health care provider. Make sure you discuss any questions you have with your health care provider. Document Released: 02/16/2009 Document   Revised: 12/19/2015 Document Reviewed: 09/06/2014 Elsevier Interactive Patient Education  2018 [employer].  Cast or Splint Care, Adult Casts and splints are supports that are worn to protect broken bones and other injuries. A cast or splint may hold a bone still and in the correct position while it heals. Casts and splints may also help to ease pain, swelling, and muscle spasms. How to care for your cast  Do not stick anything inside the cast to scratch your skin.  Check the skin around the cast every day. Tell  your doctor about any concerns.  You may put lotion on dry skin around the edges of the cast. Do not put lotion on the skin under the cast.  Keep the cast clean.  If the cast is not waterproof: ? Do not let it get wet. ? Cover it with a watertight covering when you take a bath or a shower. How to care for your splint  Wear it as told by your doctor. Take it off only as told by your doctor.  Loosen the splint if your fingers or toes tingle, get numb, or turn cold and blue.  Keep the splint clean.  If the splint is not waterproof: ? Do not let it get wet. ? Cover it with a watertight covering when you take a bath or a shower. Follow these instructions at home: Bathing  Do not take baths or swim until your doctor says it is okay. Ask your doctor if you can take showers. You may only be allowed to take sponge baths for bathing.  If your cast or splint is not waterproof, cover it with a watertight covering when you take a bath or shower. Managing pain, stiffness, and swelling  Move your fingers or toes often to avoid stiffness and to lessen swelling.  Raise (elevate) the injured area above the level of your heart while sitting or lying down. Safety  Do not use the injured limb to support your body weight until your doctor says that it is okay.  Use crutches or other assistive devices as told by your doctor. General instructions  Do not put pressure on any part of the cast or splint until it is fully hardened. This may take many hours.  Return to your normal activities as told by your doctor. Ask your doctor what activities are safe for you.  Keep all follow-up visits as told by your doctor. This is important. Contact a doctor if:  Your cast or splint gets damaged.  The skin around the cast gets red or raw.  The skin under the cast is very itchy or painful.  Your cast or splint feels very uncomfortable.  Your cast or splint is too tight or too loose.  Your cast  becomes wet or it starts to have a soft spot or area.  You get an object stuck under your cast. Get help right away if:  Your pain gets worse.  The injured area tingles, gets numb, or turns blue and cold.  The part of your body above or below the cast is swollen and it turns a different color (is discolored).  You cannot feel or move your fingers or toes.  There is fluid leaking through the cast.  You have very bad pain or pressure under the cast.  You have trouble breathing.  You have shortness of breath.  You have chest pain. This information is not intended to replace advice given to you by your health care provider. Make sure   you discuss any questions you have with your health care provider. Document Released: 08/22/2010 Document Revised: 04/12/2016 Document Reviewed: 04/12/2016 Elsevier Interactive Patient Education  2017 Elsevier Inc.  

## 2017-04-24 NOTE — Progress Notes (Signed)
Patient Yolanda Adams, female DOB:1966-04-20, 51 y.o. FAO:[id number]  Chief Complaint  Patient presents with  . New Problem    Fractured Right Wrist DOI 04/15/17     HPI  Yolanda Adams is a 51 y.o. female who has been seen for fracture of the left fifth metatarsal.  She has been using a CAM walker.  It is better.  She fell in the snow on 04-15-17 and hurt her right wrist.  She was seen in the ER.  X-rays showed: IMPRESSION: 1. Mildly displaced right ulnar styloid fracture. 2. Small ossific fragment dorsal to the carpal bones suspicious for acute triquetrum fracture. 3. Generalized soft tissue swelling. No definite distal radius or other acute fracture about the right wrist.  She was placed in a cock-up splint.  She also hurt her shoulder on the right but it is better.  Her right hand hurts, her wrist hurts. HPI  Body mass index is 32.92 kg/m.  ROS  Review of Systems  HENT: Negative for congestion.   Respiratory: Positive for shortness of breath. Negative for cough.   Cardiovascular: Negative for chest pain and leg swelling.  Endocrine: Positive for cold intolerance.  Musculoskeletal: Positive for arthralgias, gait problem and joint swelling.  Allergic/Immunologic: Positive for environmental allergies.  Psychiatric/Behavioral: The patient is nervous/anxious.   All other systems reviewed and are negative.   Past Medical History:  Diagnosis Date  . Anxiety and depression   . Chronic kidney disease    small rt kidney stone  . COPD (chronic obstructive pulmonary disease) (HCC)   . DDD (degenerative disc disease)   . GERD (gastroesophageal reflux disease)   . Helicobacter pylori gastritis June 2011   treated with Pylera  . HTN (hypertension)   . IBS (irritable bowel syndrome)   . Peptic stricture of esophagus    dilated twice 10/2009  . PUD (peptic ulcer disease)   . Renal insufficiency   . Seizures (HCC)    had 1 episode of it about 5 years ago, no other hx  of seizures  . Shortness of breath    continuous  . Shoulder pain     Past Surgical History:  Procedure Laterality Date  . ABDOMINAL HYSTERECTOMY    . BLADDER SURGERY     x 3 in [place]-strected twice and had bladder sling placed  . CHOLECYSTECTOMY    . ESOPHAGOGASTRODUODENOSCOPY  10/26/09 GASTRITIS/DUODENITIS   Distal esol stricture/dil 15-16 mm. A 17 mm dilator would not pass  . ILEOColonoscopy  04/15/2011   SLF: Colitis in the sigmoid colon/Internal hemorrhoids  . OOPHORECTOMY     Right  . SAVORY DILATION  04/15/2011   ONG:[id number]:Moderate gastritis/Duodenitis  . steroid shot in back    . UPPER GASTROINTESTINAL ENDOSCOPY  JUN 14, 2011DYSPHAGIA, EPIG PAIN   H PYLORI GASTRITIS    Family History  Problem Relation Age of Onset  . Prostate cancer Father        Partial gastrectomy  . Hypertension Father   . Stroke Father   . Colon cancer Neg Hx   . Anesthesia problems Neg Hx   . Hypotension Neg Hx   . Malignant hyperthermia Neg Hx   . Pseudochol deficiency Neg Hx     Social History Social History   Tobacco Use  . Smoking status: Current Every Day Smoker    Packs/day: 1.00    Years: 41.00    Pack years: 41.00    Types: Cigarettes  . Smokeless tobacco: Never Used  Substance Use Topics  .  Alcohol use: No  . Drug use: No    Allergies  Allergen Reactions  . Aspirin Nausea And Vomiting and Other (See Comments)    Avoid ASA due to history ulcers (PUD)  . Flagyl [Metronidazole] Nausea Only  . Sudafed [Pseudoephedrine Hcl] Nausea Only  . Toradol [Ketorolac Tromethamine] Other (See Comments)    MIGRAINE    Current Outpatient Medications  Medication Sig Dispense Refill  . acetaminophen (TYLENOL EX ST ARTHRITIS PAIN) 500 MG tablet Take 500 mg by mouth every 6 (six) hours as needed for moderate pain.     Marland Kitchen acetaminophen (TYLENOL) 500 MG tablet Take 2 tablets (1,000 mg total) by mouth every 6 (six) hours as needed. (Patient taking differently: Take 1,000 mg by mouth every 6 (six)  hours as needed for mild pain or moderate pain. ) 30 tablet 0  . albuterol (PROVENTIL HFA;VENTOLIN HFA) 108 (90 BASE) MCG/ACT inhaler Inhale 2 puffs into the lungs 4 (four) times daily.     . citalopram (CELEXA) 40 MG tablet Take 40 mg by mouth daily.    . diazepam (VALIUM) 10 MG tablet Take 10 mg by mouth 3 (three) times daily.    Marland Kitchen doxycycline (VIBRAMYCIN) 50 MG capsule Take 2 capsules (100 mg total) by mouth 2 (two) times daily. 28 capsule 0  . gabapentin (NEURONTIN) 400 MG capsule Take 400 mg by mouth 3 (three) times daily.    Marland Kitchen HYDROcodone-acetaminophen (NORCO/VICODIN) 5-325 MG tablet Take 2 tablets by mouth every 4 (four) hours as needed. 16 tablet 0  . ibuprofen (ADVIL,MOTRIN) 200 MG tablet Take 400 mg by mouth every 6 (six) hours as needed for moderate pain.     Marland Kitchen ibuprofen (ADVIL,MOTRIN) 600 MG tablet Take 1 tablet (600 mg total) every 6 (six) hours as needed by mouth. 30 tablet 0  . orphenadrine (NORFLEX) 100 MG tablet Take 1 tablet (100 mg total) by mouth 2 (two) times daily. (Patient not taking: Reported on 12/12/2014) 30 tablet 0  . oxyCODONE-acetaminophen (PERCOCET) 5-325 MG tablet Take 1 tablet every 4 (four) hours as needed by mouth. 10 tablet 0  . oxyCODONE-acetaminophen (PERCOCET/ROXICET) 5-325 MG tablet Take 1 tablet every 4 (four) hours as needed by mouth. 15 tablet 0  . predniSONE (DELTASONE) 20 MG tablet Take 2 tablets (40 mg total) by mouth daily with breakfast. 4 tablet 0  . traZODone (DESYREL) 50 MG tablet Take 50 mg by mouth at bedtime as needed for sleep.     No current facility-administered medications for this visit.      Physical Exam  Blood pressure 114/73, pulse 92, temperature (!) 96.9 F (36.1 C), height 5\' 2"  (1.575 m), weight 180 lb (81.6 kg), last menstrual period 03/30/2011.  Constitutional: overall normal hygiene, normal nutrition, well developed, normal grooming, normal body habitus. Assistive device:CAM walker left; cock-up splint right  wrist.  Musculoskeletal: gait and station Limp left, muscle tone and strength are normal, no tremors or atrophy is present.  .  Neurological: coordination overall normal.  Deep tendon reflex/nerve stretch intact.  Sensation normal.  Cranial nerves II-XII intact.   Skin:   Normal overall no scars, lesions, ulcers or rashes. No psoriasis.  Psychiatric: Alert and oriented x 3.  Recent memory intact, remote memory unclear.  Normal mood and affect. Well groomed.  Good eye contact.  Cardiovascular: overall no swelling, no varicosities, no edema bilaterally, normal temperatures of the legs and arms, no clubbing, cyanosis and good capillary refill.  Lymphatic: palpation is normal.  Left foot with  no swelling and only slight tenderness of the fifth metatarsal base.  Right wrist with swelling and pain and decreased motion. NV intact.  Slight swelling of the fingers.    All other systems reviewed and are negative   The patient has been educated about the nature of the problem(s) and counseled on treatment options.  The patient appeared to understand what I have discussed and is in agreement with it.  Encounter Diagnoses  Name Primary?  . Closed nondisplaced fracture of fifth metatarsal bone of left foot with routine healing, subsequent encounter Yes  . Closed nondisplaced fracture of triquetrum of right wrist, initial encounter   . Closed displaced fracture of styloid process of right ulna, initial encounter   . Cigarette nicotine dependence without complication    She continues to smoke and may consider cutting back.  PLAN Call if any problems.  Precautions discussed.  Continue current medications.   Return to clinic 2 weeks   A short arm cast applied.   Cast care instructions given.  I have reviewed the [place] Controlled Substance Reporting System web site prior to prescribing narcotic medicine for this patient.   The patient was counseled about smoking and smoking  cessation.  I spent about three to five minutes in doing this.  I, of course, determined the patient does smoke and frequency.  I have advised the patient of problems medically that can occur with continued smoking including poor wound and bone healing as well as the obvious respiratory problems.  I have assessed the patient's willingness to cut back and quit smoking.  The patient has stated she has cut back to a half pack and is willing to cut or stop.  I have told the patient to discuss with their family doctor also about smoking cessation.  I am willing to assist my patient in arranging this and to get additional help.I have suggested the patient have a set date to try to begin cutting back.  I have printed out a smoking cessation form about how to begin cutting back and suggestions.  I will discuss this further when I see the patient in the future.  I have stressed that although this will be very difficult to accomplish, the benefits are very substantial and will give long term health benefits from now on.  X-rays on return.  Electronically Signed Darreld McleanWayne Ashlen Kiger, MD 12/20/20182:58 PM

## 2017-05-08 ENCOUNTER — Encounter: Payer: Self-pay | Admitting: Orthopaedic Surgery

## 2017-05-08 ENCOUNTER — Ambulatory Visit (INDEPENDENT_AMBULATORY_CARE_PROVIDER_SITE_OTHER): Payer: Medicaid Other

## 2017-05-08 ENCOUNTER — Ambulatory Visit (INDEPENDENT_AMBULATORY_CARE_PROVIDER_SITE_OTHER): Payer: Self-pay | Admitting: Orthopaedic Surgery

## 2017-05-08 DIAGNOSIS — S62114D Nondisplaced fracture of triquetrum [cuneiform] bone, right wrist, subsequent encounter for fracture with routine healing: Secondary | ICD-10-CM | POA: Diagnosis not present

## 2017-05-08 MED ORDER — HYDROCODONE-ACETAMINOPHEN 5-325 MG PO TABS: ORAL_TABLET | ORAL | 0 refills | Status: DC

## 2017-05-08 NOTE — Progress Notes (Signed)
CC:  My wrist hurts  She has pain when taken out of the cast on the right.  NV intact. ROM fair of the right wrist.  She has slight swelling.  X-rays were done of the right wrist, three views, reported separately.  Encounter Diagnosis  Name Primary?  . Closed nondisplaced fracture of triquetrum of right wrist with routine healing, subsequent encounter Yes   Go back in cock-up splint she has already.  I have reviewed the [place] Controlled Substance Reporting System web site prior to prescribing narcotic medicine for this patient.  Return in three weeks.  X-rays of wrist then.  Call if any problem.  Precautions discussed.   Electronically Signed Darreld McleanWayne Burnette Sautter, MD 1/3/20193:03 PM

## 2017-05-15 ENCOUNTER — Telehealth: Payer: Self-pay | Admitting: Orthopaedic Surgery

## 2017-05-15 NOTE — Telephone Encounter (Signed)
No more narcotics. Take Tylenol, Advil or Aleve. 

## 2017-05-15 NOTE — Telephone Encounter (Signed)
Patient request refill on Hydrocodone/Acetaminophen 5-325  Mgs.   Qty  28  Sig: One tablet by mouth every six hours as needed for pain. Seven day limit per Medicaid guidelines.  Patient uses [facility] as her pharmacy

## 2017-05-29 ENCOUNTER — Encounter: Payer: Self-pay | Admitting: Orthopaedic Surgery

## 2017-05-29 ENCOUNTER — Ambulatory Visit: Payer: Medicaid Other | Admitting: Orthopaedic Surgery

## 2017-06-04 ENCOUNTER — Encounter: Payer: Self-pay | Admitting: Orthopaedic Surgery

## 2017-06-04 ENCOUNTER — Ambulatory Visit (INDEPENDENT_AMBULATORY_CARE_PROVIDER_SITE_OTHER): Payer: Medicaid Other

## 2017-06-04 ENCOUNTER — Ambulatory Visit: Payer: Medicaid Other | Admitting: Orthopaedic Surgery

## 2017-06-04 VITALS — BP 130/81 | HR 103 | Ht 62.0 in | Wt 177.0 lb

## 2017-06-04 DIAGNOSIS — M25511 Pain in right shoulder: Secondary | ICD-10-CM | POA: Diagnosis not present

## 2017-06-04 DIAGNOSIS — F17210 Nicotine dependence, cigarettes, uncomplicated: Secondary | ICD-10-CM

## 2017-06-04 MED ORDER — HYDROCODONE-ACETAMINOPHEN 5-325 MG PO TABS: ORAL_TABLET | ORAL | 0 refills | Status: DC

## 2017-06-04 NOTE — Progress Notes (Signed)
Patient WU:JWJXB:Yolanda Adams, female DOB:[birth year], 52 y.o. JYN:[id number]  Chief Complaint  Patient presents with  . New Problem    Right Shoulder     HPI  Yolanda Adams is a 52 y.o. female who complains of increasing pain of the right shoulder over the last few weeks.  She has no trauma.  She has no swelling or redness or numbness.  It hurts to raise her hand over her head.  She has pain with rolling over on it at night.  She has tried ice, heat, rubs, medicine with no help. HPI  Body mass index is 32.37 kg/m.  ROS  Review of Systems  HENT: Negative for congestion.   Respiratory: Positive for shortness of breath. Negative for cough.   Cardiovascular: Negative for chest pain and leg swelling.  Endocrine: Positive for cold intolerance.  Musculoskeletal: Positive for arthralgias, gait problem and joint swelling.  Allergic/Immunologic: Positive for environmental allergies.  Psychiatric/Behavioral: The patient is nervous/anxious.   All other systems reviewed and are negative.   Past Medical History:  Diagnosis Date  . Anxiety and depression   . Chronic kidney disease    small rt kidney stone  . COPD (chronic obstructive pulmonary disease) (HCC)   . DDD (degenerative disc disease)   . GERD (gastroesophageal reflux disease)   . Helicobacter pylori gastritis June 2011   treated with Pylera  . HTN (hypertension)   . IBS (irritable bowel syndrome)   . Peptic stricture of esophagus    dilated twice 10/2009  . PUD (peptic ulcer disease)   . Renal insufficiency   . Seizures (HCC)    had 1 episode of it about 5 years ago, no other hx of seizures  . Shortness of breath    continuous  . Shoulder pain     Past Surgical History:  Procedure Laterality Date  . ABDOMINAL HYSTERECTOMY    . BLADDER SURGERY     x 3 in [place]-strected twice and had bladder sling placed  . CHOLECYSTECTOMY    . ESOPHAGOGASTRODUODENOSCOPY  10/26/09 GASTRITIS/DUODENITIS   Distal esol stricture/dil 15-16  mm. A 17 mm dilator would not pass  . ILEOColonoscopy  04/15/2011   SLF: Colitis in the sigmoid colon/Internal hemorrhoids  . OOPHORECTOMY     Right  . SAVORY DILATION  04/15/2011   QMV:[id number]:Moderate gastritis/Duodenitis  . steroid shot in back    . UPPER GASTROINTESTINAL ENDOSCOPY  JUN 14, 2011DYSPHAGIA, EPIG PAIN   H PYLORI GASTRITIS    Family History  Problem Relation Age of Onset  . Prostate cancer Father        Partial gastrectomy  . Hypertension Father   . Stroke Father   . Colon cancer Neg Hx   . Anesthesia problems Neg Hx   . Hypotension Neg Hx   . Malignant hyperthermia Neg Hx   . Pseudochol deficiency Neg Hx     Social History Social History   Tobacco Use  . Smoking status: Current Every Day Smoker    Packs/day: 1.00    Years: 41.00    Pack years: 41.00    Types: Cigarettes  . Smokeless tobacco: Never Used  Substance Use Topics  . Alcohol use: No  . Drug use: No    Allergies  Allergen Reactions  . Aspirin Nausea And Vomiting and Other (See Comments)    Avoid ASA due to history ulcers (PUD)  . Flagyl [Metronidazole] Nausea Only  . Sudafed [Pseudoephedrine Hcl] Nausea Only  . Toradol [Ketorolac Tromethamine] Other (See  Comments)    MIGRAINE    Current Outpatient Medications  Medication Sig Dispense Refill  . acetaminophen (TYLENOL EX ST ARTHRITIS PAIN) 500 MG tablet Take 500 mg by mouth every 6 (six) hours as needed for moderate pain.     Marland Kitchen acetaminophen (TYLENOL) 500 MG tablet Take 2 tablets (1,000 mg total) by mouth every 6 (six) hours as needed. (Patient taking differently: Take 1,000 mg by mouth every 6 (six) hours as needed for mild pain or moderate pain. ) 30 tablet 0  . albuterol (PROVENTIL HFA;VENTOLIN HFA) 108 (90 BASE) MCG/ACT inhaler Inhale 2 puffs into the lungs 4 (four) times daily.     . citalopram (CELEXA) 40 MG tablet Take 40 mg by mouth daily.    . diazepam (VALIUM) 10 MG tablet Take 10 mg by mouth 3 (three) times daily.    Marland Kitchen doxycycline  (VIBRAMYCIN) 50 MG capsule Take 2 capsules (100 mg total) by mouth 2 (two) times daily. 28 capsule 0  . gabapentin (NEURONTIN) 400 MG capsule Take 400 mg by mouth 3 (three) times daily.    Marland Kitchen HYDROcodone-acetaminophen (NORCO/VICODIN) 5-325 MG tablet One tablet by mouth every six hours as needed for pain.  Seven day limit per Medicaid guidelines. 28 tablet 0  . ibuprofen (ADVIL,MOTRIN) 200 MG tablet Take 400 mg by mouth every 6 (six) hours as needed for moderate pain.     Marland Kitchen ibuprofen (ADVIL,MOTRIN) 600 MG tablet Take 1 tablet (600 mg total) every 6 (six) hours as needed by mouth. 30 tablet 0  . orphenadrine (NORFLEX) 100 MG tablet Take 1 tablet (100 mg total) by mouth 2 (two) times daily. (Patient not taking: Reported on 12/12/2014) 30 tablet 0  . predniSONE (DELTASONE) 20 MG tablet Take 2 tablets (40 mg total) by mouth daily with breakfast. 4 tablet 0  . traZODone (DESYREL) 50 MG tablet Take 50 mg by mouth at bedtime as needed for sleep.     No current facility-administered medications for this visit.      Physical Exam  Blood pressure 130/81, pulse (!) 103, height 5\' 2"  (1.575 m), weight 177 lb (80.3 kg), last menstrual period 03/30/2011.  Constitutional: overall normal hygiene, normal nutrition, well developed, normal grooming, normal body habitus. Assistive device:none  Musculoskeletal: gait and station Limp none, muscle tone and strength are normal, no tremors or atrophy is present.  .  Neurological: coordination overall normal.  Deep tendon reflex/nerve stretch intact.  Sensation normal.  Cranial nerves II-XII intact.   Skin:   Normal overall no scars, lesions, ulcers or rashes. No psoriasis.  Psychiatric: Alert and oriented x 3.  Recent memory intact, remote memory unclear.  Normal mood and affect. Well groomed.  Good eye contact.  Cardiovascular: overall no swelling, no varicosities, no edema bilaterally, normal temperatures of the legs and arms, no clubbing, cyanosis and good  capillary refill.  Lymphatic: palpation is normal.  Examination of right Upper Extremity is done.  Inspection:   Overall:  Elbow non-tender without crepitus or defects, forearm non-tender without crepitus or defects, wrist non-tender without crepitus or defects, hand non-tender.    Shoulder: with glenohumeral joint tenderness, without effusion.   Upper arm: without swelling and tenderness   Range of motion:   Overall:  Full range of motion of the elbow, full range of motion of wrist and full range of motion in fingers.   Shoulder:  right  150 degrees forward flexion; 100 degrees abduction; 25 degrees internal rotation, 25 degrees external rotation, 10 degrees extension,  35 degrees adduction.   Stability:   Overall:  Shoulder, elbow and wrist stable   Strength and Tone:   Overall full shoulder muscles strength, full upper arm strength and normal upper arm bulk and tone. All other systems reviewed and are negative   The patient has been educated about the nature of the problem(s) and counseled on treatment options.  The patient appeared to understand what I have discussed and is in agreement with it.  Encounter Diagnosis  Name Primary?  . Pain in joint of right shoulder Yes    X-rays were done of the right shoulder, reported separately.  PLAN Call if any problems.  Precautions discussed.  Continue current medications.   Return to clinic 2 weeks  Procedure note: After permission from the patient, the area of the distal clavicle on the right was prepped.  This area was injected with 1% Xylocaine and 1 cc DepoMedrol by sterile technique well tolerated.  I have reviewed the [place] Controlled Substance Reporting System web site prior to prescribing narcotic medicine for this patient.  Electronically Signed Darreld Mclean, MD 1/30/20193:24 PM

## 2017-06-04 NOTE — Patient Instructions (Addendum)
Steps to Quit Smoking Smoking tobacco can be bad for your health. It can also affect almost every organ in your body. Smoking puts you and people around you at risk for many serious Yolanda Adams-lasting (chronic) diseases. Quitting smoking is hard, but it is one of the best things that you can do for your health. It is never too late to quit. What are the benefits of quitting smoking? When you quit smoking, you lower your risk for getting serious diseases and conditions. They can include:  Lung cancer or lung disease.  Heart disease.  Stroke.  Heart attack.  Not being able to have children (infertility).  Weak bones (osteoporosis) and broken bones (fractures).  If you have coughing, wheezing, and shortness of breath, those symptoms may get better when you quit. You may also get sick less often. If you are pregnant, quitting smoking can help to lower your chances of having a baby of low birth weight. What can I do to help me quit smoking? Talk with your doctor about what can help you quit smoking. Some things you can do (strategies) include:  Quitting smoking totally, instead of slowly cutting back how much you smoke over a period of time.  Going to in-person counseling. You are more likely to quit if you go to many counseling sessions.  Using resources and support systems, such as: ? Online chats with a counselor. ? Phone quitlines. ? Printed self-help materials. ? Support groups or group counseling. ? Text messaging programs. ? Mobile phone apps or applications.  Taking medicines. Some of these medicines may have nicotine in them. If you are pregnant or breastfeeding, do not take any medicines to quit smoking unless your doctor says it is okay. Talk with your doctor about counseling or other things that can help you.  Talk with your doctor about using more than one strategy at the same time, such as taking medicines while you are also going to in-person counseling. This can help make  quitting easier. What things can I do to make it easier to quit? Quitting smoking might feel very hard at first, but there is a lot that you can do to make it easier. Take these steps:  Talk to your family and friends. Ask them to support and encourage you.  Call phone quitlines, reach out to support groups, or work with a counselor.  Ask people who smoke to not smoke around you.  Avoid places that make you want (trigger) to smoke, such as: ? Bars. ? Parties. ? Smoke-break areas at work.  Spend time with people who do not smoke.  Lower the stress in your life. Stress can make you want to smoke. Try these things to help your stress: ? Getting regular exercise. ? Deep-breathing exercises. ? Yoga. ? Meditating. ? Doing a body scan. To do this, close your eyes, focus on one area of your body at a time from head to toe, and notice which parts of your body are tense. Try to relax the muscles in those areas.  Download or buy apps on your mobile phone or tablet that can help you stick to your quit plan. There are many free apps, such as QuitGuide from the CDC (Centers for Disease Control and Prevention). You can find more support from smokefree.gov and other websites.  This information is not intended to replace advice given to you by your health care provider. Make sure you discuss any questions you have with your health care provider. Document Released: 02/16/2009 Document   Revised: 12/19/2015 Document Reviewed: 09/06/2014 Elsevier Interactive Patient Education  2018 Elsevier Inc.  

## 2017-06-05 ENCOUNTER — Ambulatory Visit: Payer: Medicaid Other | Admitting: Orthopaedic Surgery

## 2017-06-12 ENCOUNTER — Telehealth: Payer: Self-pay | Admitting: Orthopaedic Surgery

## 2017-06-12 NOTE — Telephone Encounter (Signed)
No narcotic.  Take Tylenol, Advil or Aleve

## 2017-06-12 NOTE — Telephone Encounter (Signed)
Patient called for refill:  HYDROcodone-acetaminophen (NORCO/VICODIN) 5-325 MG tablet 28 tablet  - [facility]

## 2017-06-12 NOTE — Telephone Encounter (Signed)
Relayed to patient.

## 2017-06-18 ENCOUNTER — Ambulatory Visit: Payer: Medicaid Other | Admitting: Orthopaedic Surgery

## 2017-06-19 ENCOUNTER — Ambulatory Visit: Payer: Medicaid Other | Admitting: Orthopaedic Surgery

## 2017-07-30 ENCOUNTER — Other Ambulatory Visit (HOSPITAL_COMMUNITY): Payer: Self-pay | Admitting: Family Medicine

## 2017-07-30 ENCOUNTER — Ambulatory Visit (HOSPITAL_COMMUNITY)
Admission: RE | Admit: 2017-07-30 | Discharge: 2017-07-30 | Disposition: A | Discharge status: Home / Self Care | Payer: Medicaid Other | Source: Ambulatory Visit | Attending: Family Medicine | Admitting: Family Medicine

## 2017-07-30 DIAGNOSIS — M25511 Pain in right shoulder: Secondary | ICD-10-CM

## 2017-07-30 DIAGNOSIS — G8929 Other chronic pain: Secondary | ICD-10-CM

## 2017-07-30 DIAGNOSIS — M47816 Spondylosis without myelopathy or radiculopathy, lumbar region: Secondary | ICD-10-CM | POA: Diagnosis not present

## 2017-07-30 DIAGNOSIS — M545 Low back pain: Secondary | ICD-10-CM | POA: Diagnosis present

## 2018-03-13 LAB — GLUCOSE, POCT (MANUAL RESULT ENTRY): POC Glucose: 178 mg/dl — AB (ref 70–99)

## 2018-05-22 ENCOUNTER — Other Ambulatory Visit: Payer: Self-pay

## 2018-05-22 ENCOUNTER — Emergency Department (HOSPITAL_COMMUNITY): Payer: Medicaid Other

## 2018-05-22 ENCOUNTER — Emergency Department (HOSPITAL_COMMUNITY)
Admission: EM | Admit: 2018-05-22 | Discharge: 2018-05-22 | Disposition: A | Discharge status: Home / Self Care | Payer: Medicaid Other | Attending: Emergency Medicine | Admitting: Emergency Medicine

## 2018-05-22 ENCOUNTER — Encounter (HOSPITAL_COMMUNITY): Payer: Self-pay

## 2018-05-22 DIAGNOSIS — J449 Chronic obstructive pulmonary disease, unspecified: Secondary | ICD-10-CM | POA: Diagnosis not present

## 2018-05-22 DIAGNOSIS — I10 Essential (primary) hypertension: Secondary | ICD-10-CM | POA: Diagnosis not present

## 2018-05-22 DIAGNOSIS — F17210 Nicotine dependence, cigarettes, uncomplicated: Secondary | ICD-10-CM | POA: Diagnosis not present

## 2018-05-22 DIAGNOSIS — F1123 Opioid dependence with withdrawal: Secondary | ICD-10-CM | POA: Diagnosis not present

## 2018-05-22 DIAGNOSIS — R112 Nausea with vomiting, unspecified: Secondary | ICD-10-CM | POA: Diagnosis present

## 2018-05-22 DIAGNOSIS — Z79899 Other long term (current) drug therapy: Secondary | ICD-10-CM | POA: Diagnosis not present

## 2018-05-22 DIAGNOSIS — K920 Hematemesis: Secondary | ICD-10-CM | POA: Diagnosis not present

## 2018-05-22 LAB — URINALYSIS, ROUTINE W REFLEX MICROSCOPIC
Bilirubin Urine: NEGATIVE
Glucose, UA: NEGATIVE mg/dL
Hgb urine dipstick: NEGATIVE
KETONES UR: NEGATIVE mg/dL
Nitrite: NEGATIVE
PROTEIN: 30 mg/dL — AB
Specific Gravity, Urine: 1.021 (ref 1.005–1.030)
pH: 5 (ref 5.0–8.0)

## 2018-05-22 LAB — CBC WITH DIFFERENTIAL/PLATELET
Abs Immature Granulocytes: 0.05 10*3/uL (ref 0.00–0.07)
Basophils Absolute: 0 10*3/uL (ref 0.0–0.1)
Basophils Relative: 0 %
EOS ABS: 0 10*3/uL (ref 0.0–0.5)
EOS PCT: 0 %
HEMATOCRIT: 50 % — AB (ref 36.0–46.0)
HEMOGLOBIN: 16.9 g/dL — AB (ref 12.0–15.0)
Immature Granulocytes: 0 %
LYMPHS ABS: 1.4 10*3/uL (ref 0.7–4.0)
LYMPHS PCT: 10 %
MCH: 31.5 pg (ref 26.0–34.0)
MCHC: 33.8 g/dL (ref 30.0–36.0)
MCV: 93.1 fL (ref 80.0–100.0)
MONO ABS: 0.8 10*3/uL (ref 0.1–1.0)
Monocytes Relative: 6 %
Neutro Abs: 12 10*3/uL — ABNORMAL HIGH (ref 1.7–7.7)
Neutrophils Relative %: 84 %
Platelets: 310 10*3/uL (ref 150–400)
RBC: 5.37 MIL/uL — ABNORMAL HIGH (ref 3.87–5.11)
RDW: 12.3 % (ref 11.5–15.5)
WBC: 14.3 10*3/uL — ABNORMAL HIGH (ref 4.0–10.5)
nRBC: 0 % (ref 0.0–0.2)

## 2018-05-22 LAB — PROTIME-INR
INR: 1.06
Prothrombin Time: 13.7 seconds (ref 11.4–15.2)

## 2018-05-22 LAB — COMPREHENSIVE METABOLIC PANEL
ALK PHOS: 80 U/L (ref 38–126)
ALT: 20 U/L (ref 0–44)
ANION GAP: 13 (ref 5–15)
AST: 18 U/L (ref 15–41)
Albumin: 4.5 g/dL (ref 3.5–5.0)
BILIRUBIN TOTAL: 1.2 mg/dL (ref 0.3–1.2)
BUN: 22 mg/dL — ABNORMAL HIGH (ref 6–20)
CALCIUM: 9.8 mg/dL (ref 8.9–10.3)
CO2: 26 mmol/L (ref 22–32)
CREATININE: 0.81 mg/dL (ref 0.44–1.00)
Chloride: 93 mmol/L — ABNORMAL LOW (ref 98–111)
GFR calc non Af Amer: >60 mL/min (ref >60)
Glucose, Bld: 142 mg/dL — ABNORMAL HIGH (ref 70–99)
POTASSIUM: 2.9 mmol/L — AB (ref 3.5–5.1)
Sodium: 132 mmol/L — ABNORMAL LOW (ref 135–145)
TOTAL PROTEIN: 8.5 g/dL — AB (ref 6.5–8.1)

## 2018-05-22 LAB — LIPASE, BLOOD: Lipase: 71 U/L — ABNORMAL HIGH (ref 11–51)

## 2018-05-22 LAB — POC OCCULT BLOOD, ED: Fecal Occult Bld: NEGATIVE

## 2018-05-22 MED ORDER — FAMOTIDINE IN NACL 20-0.9 MG/50ML-% IV SOLN
20.0000 mg | Freq: Once | INTRAVENOUS | Status: AC
  Administered 2018-05-22: 20 mg via INTRAVENOUS
  Filled 2018-05-22: qty 50

## 2018-05-22 MED ORDER — SODIUM CHLORIDE 0.9 % IV BOLUS
1000.0000 mL | Freq: Once | INTRAVENOUS | Status: AC
  Administered 2018-05-22: 1000 mL via INTRAVENOUS

## 2018-05-22 MED ORDER — POTASSIUM CHLORIDE CRYS ER 20 MEQ PO TBCR: 40.0000 meq | EXTENDED_RELEASE_TABLET | Freq: Once | ORAL | Status: DC

## 2018-05-22 MED ORDER — POTASSIUM CHLORIDE CRYS ER 20 MEQ PO TBCR
40.0000 meq | EXTENDED_RELEASE_TABLET | Freq: Once | ORAL | Status: AC
  Administered 2018-05-22: 40 meq via ORAL
  Filled 2018-05-22: qty 2

## 2018-05-22 MED ORDER — PROMETHAZINE HCL 25 MG/ML IJ SOLN
12.5000 mg | Freq: Once | INTRAMUSCULAR | Status: AC
  Administered 2018-05-22: 12.5 mg via INTRAVENOUS
  Filled 2018-05-22: qty 1

## 2018-05-22 MED ORDER — POTASSIUM CHLORIDE CRYS ER 20 MEQ PO TBCR
20.0000 meq | EXTENDED_RELEASE_TABLET | Freq: Once | ORAL | Status: AC
  Administered 2018-05-22: 20 meq via ORAL

## 2018-05-22 MED ORDER — PROMETHAZINE HCL 25 MG PO TABS: 25.0000 mg | ORAL_TABLET | Freq: Four times a day (QID) | ORAL | 0 refills | Status: DC | PRN

## 2018-05-22 MED ORDER — PANTOPRAZOLE SODIUM 40 MG IV SOLR
40.0000 mg | Freq: Once | INTRAVENOUS | Status: AC
  Administered 2018-05-22: 40 mg via INTRAVENOUS
  Filled 2018-05-22: qty 40

## 2018-05-22 NOTE — ED Triage Notes (Signed)
Pt is detoxing off of heroin, xanax, and benzos. [facility] Librium. Coffee ground emesis. Pt last took drugs Monday morning. Heroin, benzos, xanax, and a suboxone strip.

## 2018-05-22 NOTE — ED Notes (Signed)
PT GIVEN SPRITE TO DRINK. SHE DRANK IT WITHOUT ANY PROBLEMS

## 2018-05-22 NOTE — Discharge Instructions (Addendum)
Take the prescription as directed.  Increase your fluid intake (ie:  Gatoraide) for the next few days.  Eat a bland diet for the next several days. Advance to your regular diet slowly as you can tolerate it.  Call your regular medical doctor today to schedule a follow up appointment in the next 2 to 3 days.  Return to the Emergency Department immediately sooner if worsening.

## 2018-05-22 NOTE — ED Provider Notes (Signed)
[facility] EMERGENCY DEPARTMENT Provider Note   CSN: [id number] Arrival date & time: 05/22/18  [id number]     History   Chief Complaint Chief Complaint  Patient presents with  . Detox    HPI Yolanda Adams is a 53 y.o. female.  HPI  Pt was seen at 0740. Per pt and jail staff:  Pt c/o gradual onset and persistence of multiple intermittent episodes of N/V for the past 3 days. Pt states she is "detoxing" off benzos, heroin and suboxone. LD 4 days ago. States jail RN has been rx detox protocol. Pt has been taking phenergan without improvement of her N/V. States her emesis yesterday "turned dark." Has been associated with mid-epigastric "pain." Pt cannot recall when she had her last BM.  Denies diarrhea, no CP/SOB, no back pain, no fevers, no black or blood in stools, no red blood in emesis.    Past Medical History:  Diagnosis Date  . Anxiety and depression   . Chronic kidney disease    small rt kidney stone  . COPD (chronic obstructive pulmonary disease) (HCC)   . DDD (degenerative disc disease)   . GERD (gastroesophageal reflux disease)   . Helicobacter pylori gastritis June 2011   treated with Pylera  . HTN (hypertension)   . IBS (irritable bowel syndrome)   . Peptic stricture of esophagus    dilated twice 10/2009  . PUD (peptic ulcer disease)   . Renal insufficiency   . Seizures (HCC)    had 1 episode of it about 5 years ago, no other hx of seizures  . Shortness of breath    continuous  . Shoulder pain     Patient Active Problem List   Diagnosis Date Noted  . Melena 03/20/2011  . Right sided abdominal pain 03/20/2011  . DIARRHEA, CHRONIC 06/05/2010  . ABDOMINAL PAIN, UNSPECIFIED SITE 03/13/2010  . GERD 10/09/2009  . Esophageal dysphagia 10/09/2009  . PUD, HX OF 10/09/2009  . IRRITABLE BOWEL SYNDROME, HX OF 10/09/2009  . CERVICAL SPASM 09/07/2009  . IMPINGEMENT SYNDROME 06/20/2009  . HIGH BLOOD PRESSURE 06/20/2009    Past Surgical History:  Procedure  Laterality Date  . ABDOMINAL HYSTERECTOMY    . BLADDER SURGERY     x 3 in [place]-strected twice and had bladder sling placed  . CHOLECYSTECTOMY    . ESOPHAGOGASTRODUODENOSCOPY  10/26/09 GASTRITIS/DUODENITIS   Distal esol stricture/dil 15-16 mm. A 17 mm dilator would not pass  . ILEOColonoscopy  04/15/2011   SLF: Colitis in the sigmoid colon/Internal hemorrhoids  . OOPHORECTOMY     Right  . SAVORY DILATION  04/15/2011   JXB:[id number]:Moderate gastritis/Duodenitis  . steroid shot in back    . UPPER GASTROINTESTINAL ENDOSCOPY  JUN 14, 2011DYSPHAGIA, EPIG PAIN   H PYLORI GASTRITIS     OB History    Gravida  3   Para  1   Term  1   Preterm      AB  2   Living  1     SAB  1   TAB  1   Ectopic      Multiple      Live Births               Home Medications    Prior to Admission medications   Medication Sig Start Date End Date Taking? Authorizing Provider  acetaminophen (TYLENOL EX ST ARTHRITIS PAIN) 500 MG tablet Take 500 mg by mouth every 6 (six) hours as needed for moderate pain.  [provider]  acetaminophen (TYLENOL) 500 MG tablet Take 2 tablets (1,000 mg total) by mouth every 6 (six) hours as needed. Patient taking differently: Take 1,000 mg by mouth every 6 (six) hours as needed for mild pain or moderate pain.  11/28/14   Arby Barrette, MD  albuterol (PROVENTIL HFA;VENTOLIN HFA) 108 (90 BASE) MCG/ACT inhaler Inhale 2 puffs into the lungs 4 (four) times daily.     [provider]  citalopram (CELEXA) 40 MG tablet Take 40 mg by mouth daily.    [provider]  diazepam (VALIUM) 10 MG tablet Take 10 mg by mouth 3 (three) times daily.    [provider]  doxycycline (VIBRAMYCIN) 50 MG capsule Take 2 capsules (100 mg total) by mouth 2 (two) times daily. 12/12/14   Rumley, [place] N, DO  gabapentin (NEURONTIN) 400 MG capsule Take 400 mg by mouth 3 (three) times daily.    [provider]  HYDROcodone-acetaminophen (NORCO/VICODIN)  5-325 MG tablet One tablet by mouth every six hours as needed for pain.  Seven day limit 06/04/17   Darreld Mclean, MD  ibuprofen (ADVIL,MOTRIN) 200 MG tablet Take 400 mg by mouth every 6 (six) hours as needed for moderate pain.     [provider]  ibuprofen (ADVIL,MOTRIN) 600 MG tablet Take 1 tablet (600 mg total) every 6 (six) hours as needed by mouth. 03/15/17   Dartha Lodge, PA-C  orphenadrine (NORFLEX) 100 MG tablet Take 1 tablet (100 mg total) by mouth 2 (two) times daily. Patient not taking: Reported on 12/12/2014 11/28/14   Arby Barrette, MD  predniSONE (DELTASONE) 20 MG tablet Take 2 tablets (40 mg total) by mouth daily with breakfast. 12/12/14   Rumley, [place] N, DO  traZODone (DESYREL) 50 MG tablet Take 50 mg by mouth at bedtime as needed for sleep.    [provider]    Family History Family History  Problem Relation Age of Onset  . Prostate cancer Father        Partial gastrectomy  . Hypertension Father   . Stroke Father   . Colon cancer Neg Hx   . Anesthesia problems Neg Hx   . Hypotension Neg Hx   . Malignant hyperthermia Neg Hx   . Pseudochol deficiency Neg Hx     Social History Social History   Tobacco Use  . Smoking status: Current Every Day Smoker    Packs/day: 1.00    Years: 41.00    Pack years: 41.00    Types: Cigarettes  . Smokeless tobacco: Never Used  Substance Use Topics  . Alcohol use: No  . Drug use: No    Comment: heroin      Allergies   Aspirin; Flagyl [metronidazole]; Sudafed [pseudoephedrine hcl]; and Toradol [ketorolac tromethamine]   Review of Systems Review of Systems ROS: Statement: All systems negative except as marked or noted in the HPI; Constitutional: Negative for fever and chills. ; ; Eyes: Negative for eye pain, redness and discharge. ; ; ENMT: Negative for ear pain, hoarseness, nasal congestion, sinus pressure and sore throat. ; ; Cardiovascular: Negative for chest pain, palpitations, diaphoresis, dyspnea and  peripheral edema. ; ; Respiratory: Negative for cough, wheezing and stridor. ; ; Gastrointestinal: +N/V, abd pain. Negative for diarrhea, blood in stool, hematemesis, jaundice and rectal bleeding. . ; ; Genitourinary: Negative for dysuria, flank pain and hematuria. ; ; Musculoskeletal: Negative for back pain and neck pain. Negative for swelling and trauma.; ; Skin: Negative for pruritus, rash, abrasions,  blisters, bruising and skin lesion.; ; Neuro: Negative for headache, lightheadedness and neck stiffness. Negative for weakness, altered level of consciousness, altered mental status, extremity weakness, paresthesias, involuntary movement, seizure and syncope.       Physical Exam Updated Vital Signs BP 140/86 (BP Location: Right Arm)   Pulse 63   Temp 97.8 F (36.6 C) (Oral)   Resp 16   Ht 5' 2.5" (1.588 m)   Wt 78.9 kg   LMP 03/30/2011   SpO2 100%   BMI 31.32 kg/m   Physical Exam 0745: Physical examination:  Nursing notes reviewed; Vital signs and O2 SAT reviewed;  Constitutional: Well developed, Well nourished, Well hydrated, In no acute distress; Head:  Normocephalic, atraumatic; Eyes: EOMI, PERRL, No scleral icterus; ENMT: Mouth and pharynx normal, Mucous membranes moist; Neck: Supple, Full range of motion, No lymphadenopathy; Cardiovascular: Regular rate and rhythm, No gallop; Respiratory: Breath sounds clear & equal bilaterally, No wheezes.  Speaking full sentences with ease, Normal respiratory effort/excursion; Chest: Nontender, Movement normal; Abdomen: Soft, +mid-epigastric tenderness to palp. No rebound or guarding. Nondistended, Normal bowel sounds. Rectal exam performed w/permission of pt and ED RN chaperone present.  Anal tone normal.  Non-tender, soft brown stool in rectal vault, heme neg.  No fissures, no external hemorrhoids, no palp masses.; Genitourinary: No CVA tenderness; Extremities: Peripheral pulses normal, No tenderness, No edema, No calf edema or asymmetry.; Neuro:  AA&Ox3, Major CN grossly intact.  Speech clear. No gross focal motor or sensory deficits in extremities.; Skin: Color normal, Warm, Dry.   ED Treatments / Results  Labs (all labs ordered are listed, but only abnormal results are displayed)   EKG None  Radiology    Procedures Procedures (including critical care time)  Medications Ordered in ED Medications  sodium chloride 0.9 % bolus 1,000 mL (has no administration in time range)  famotidine (PEPCID) IVPB 20 mg premix (has no administration in time range)  pantoprazole (PROTONIX) injection 40 mg (has no administration in time range)  promethazine (PHENERGAN) injection 12.5 mg (has no administration in time range)     Initial Impression / Assessment and Plan / ED Course  I have reviewed the triage vital signs and the nursing notes.  Pertinent labs & imaging results that were available during my care of the patient were reviewed by me and considered in my medical decision making (see chart for details).  MDM Reviewed: previous chart, nursing note and vitals Reviewed previous: labs Interpretation: labs and x-ray    Results for orders placed or performed during the hospital encounter of 05/22/18  Comprehensive metabolic panel  Result Value Ref Range   Sodium 132 (L) 135 - 145 mmol/L   Potassium 2.9 (L) 3.5 - 5.1 mmol/L   Chloride 93 (L) 98 - 111 mmol/L   CO2 26 22 - 32 mmol/L   Glucose, Bld 142 (H) 70 - 99 mg/dL   BUN 22 (H) 6 - 20 mg/dL   Creatinine, Ser 0.03 0.44 - 1.00 mg/dL   Calcium 9.8 8.9 - 70.4 mg/dL   Total Protein 8.5 (H) 6.5 - 8.1 g/dL   Albumin 4.5 3.5 - 5.0 g/dL   AST 18 15 - 41 U/L   ALT 20 0 - 44 U/L   Alkaline Phosphatase 80 38 - 126 U/L   Total Bilirubin 1.2 0.3 - 1.2 mg/dL   GFR calc non Af Amer >60 >60 mL/min   GFR calc Af Amer >60 >60 mL/min   Anion gap 13 5 - 15  Lipase, blood  Result Value Ref Range   Lipase 71 (H) 11 - 51 U/L  CBC with Differential  Result Value Ref Range   WBC 14.3  (H) 4.0 - 10.5 K/uL   RBC 5.37 (H) 3.87 - 5.11 MIL/uL   Hemoglobin 16.9 (H) 12.0 - 15.0 g/dL   HCT 40.950.0 (H) 81.136.0 - 91.446.0 %   MCV 93.1 80.0 - 100.0 fL   MCH 31.5 26.0 - 34.0 pg   MCHC 33.8 30.0 - 36.0 g/dL   RDW 78.212.3 95.611.5 - 21.315.5 %   Platelets 310 150 - 400 K/uL   nRBC 0.0 0.0 - 0.2 %   Neutrophils Relative % 84 %   Neutro Abs 12.0 (H) 1.7 - 7.7 K/uL   Lymphocytes Relative 10 %   Lymphs Abs 1.4 0.7 - 4.0 K/uL   Monocytes Relative 6 %   Monocytes Absolute 0.8 0.1 - 1.0 K/uL   Eosinophils Relative 0 %   Eosinophils Absolute 0.0 0.0 - 0.5 K/uL   Basophils Relative 0 %   Basophils Absolute 0.0 0.0 - 0.1 K/uL   Immature Granulocytes 0 %   Abs Immature Granulocytes 0.05 0.00 - 0.07 K/uL  Protime-INR  Result Value Ref Range   Prothrombin Time 13.7 11.4 - 15.2 seconds   INR 1.06   Urinalysis, Routine w reflex microscopic  Result Value Ref Range   Color, Urine YELLOW YELLOW   APPearance CLEAR CLEAR   Specific Gravity, Urine 1.021 1.005 - 1.030   pH 5.0 5.0 - 8.0   Glucose, UA NEGATIVE NEGATIVE mg/dL   Hgb urine dipstick NEGATIVE NEGATIVE   Bilirubin Urine NEGATIVE NEGATIVE   Ketones, ur NEGATIVE NEGATIVE mg/dL   Protein, ur 30 (A) NEGATIVE mg/dL   Nitrite NEGATIVE NEGATIVE   Leukocytes, UA LARGE (A) NEGATIVE   RBC / HPF 11-20 0 - 5 RBC/hpf   WBC, UA 21-50 0 - 5 WBC/hpf   Bacteria, UA RARE (A) NONE SEEN   Squamous Epithelial / LPF 0-5 0 - 5   Mucus PRESENT   POC occult blood, ED  Result Value Ref Range   Fecal Occult Bld NEGATIVE NEGATIVE   Dg Abd Acute W/chest Result Date: 05/22/2018 CLINICAL DATA:  Nausea and vomiting with coffee-ground emesis. Undergoing detoxification EXAM: DG ABDOMEN ACUTE W/ 1V CHEST COMPARISON:  12/12/2014 chest x-ray FINDINGS: Normal bowel gas pattern. No abnormal stool retention. Cholecystectomy clips. No concerning mass effect or gas collection. Interstitial coarsening of the lungs correlating with COPD history. There is no edema, consolidation,  effusion, or pneumothorax. Normal heart size and mediastinal contours. Remote right eighth rib fracture. IMPRESSION: Negative abdominal radiographs.  No acute cardiopulmonary disease. Electronically Signed   By: Marnee SpringJonathon  Watts M.D.   On: 05/22/2018 08:23    1200:  No clear UTI on Udip; no c/o dysuria. UC is pending. Potassium repleted PO. Pt has tol PO well while in the ED without N/V.  No stooling while in the ED.  Abd benign, resps easy, VSS. H/H per baseline and stool is heme negative. Feels better and wants to leave now. Pt already on withdrawal protocol at jail. Dx and testing d/w pt.  Questions answered.  Verb understanding, agreeable to d/c back to facility with outpt f/u.      Final Clinical Impressions(s) / ED Diagnoses   Final diagnoses:  None    ED Discharge Orders    None       Samuel JesterMcManus, Tysen Roesler, DO 05/24/18 [id number]

## 2018-05-23 LAB — URINE CULTURE: Culture: NO GROWTH

## 2018-06-15 ENCOUNTER — Emergency Department (HOSPITAL_COMMUNITY): Payer: Medicaid Other

## 2018-06-15 ENCOUNTER — Inpatient Hospital Stay (HOSPITAL_COMMUNITY)
Admission: EM | Admit: 2018-06-15 | Discharge: 2018-06-17 | DRG: 917 | Disposition: A | Discharge status: Home / Self Care | Payer: Medicaid Other | Attending: Family Medicine | Admitting: Family Medicine

## 2018-06-15 DIAGNOSIS — F1510 Other stimulant abuse, uncomplicated: Secondary | ICD-10-CM | POA: Diagnosis present

## 2018-06-15 DIAGNOSIS — G4089 Other seizures: Secondary | ICD-10-CM | POA: Diagnosis present

## 2018-06-15 DIAGNOSIS — Z791 Long term (current) use of non-steroidal anti-inflammatories (NSAID): Secondary | ICD-10-CM

## 2018-06-15 DIAGNOSIS — T404X1A Poisoning by other synthetic narcotics, accidental (unintentional), initial encounter: Principal | ICD-10-CM | POA: Diagnosis present

## 2018-06-15 DIAGNOSIS — R1314 Dysphagia, pharyngoesophageal phase: Secondary | ICD-10-CM | POA: Diagnosis present

## 2018-06-15 DIAGNOSIS — E860 Dehydration: Secondary | ICD-10-CM | POA: Diagnosis present

## 2018-06-15 DIAGNOSIS — R4189 Other symptoms and signs involving cognitive functions and awareness: Secondary | ICD-10-CM | POA: Diagnosis present

## 2018-06-15 DIAGNOSIS — E872 Acidosis: Secondary | ICD-10-CM | POA: Diagnosis present

## 2018-06-15 DIAGNOSIS — K219 Gastro-esophageal reflux disease without esophagitis: Secondary | ICD-10-CM | POA: Diagnosis present

## 2018-06-15 DIAGNOSIS — J9600 Acute respiratory failure, unspecified whether with hypoxia or hypercapnia: Secondary | ICD-10-CM | POA: Diagnosis present

## 2018-06-15 DIAGNOSIS — Z23 Encounter for immunization: Secondary | ICD-10-CM

## 2018-06-15 DIAGNOSIS — T40601A Poisoning by unspecified narcotics, accidental (unintentional), initial encounter: Secondary | ICD-10-CM

## 2018-06-15 DIAGNOSIS — F329 Major depressive disorder, single episode, unspecified: Secondary | ICD-10-CM | POA: Diagnosis present

## 2018-06-15 DIAGNOSIS — F32A Depression, unspecified: Secondary | ICD-10-CM | POA: Diagnosis present

## 2018-06-15 DIAGNOSIS — J9690 Respiratory failure, unspecified, unspecified whether with hypoxia or hypercapnia: Secondary | ICD-10-CM

## 2018-06-15 DIAGNOSIS — Z915 Personal history of self-harm: Secondary | ICD-10-CM

## 2018-06-15 DIAGNOSIS — F17210 Nicotine dependence, cigarettes, uncomplicated: Secondary | ICD-10-CM | POA: Diagnosis present

## 2018-06-15 DIAGNOSIS — F1310 Sedative, hypnotic or anxiolytic abuse, uncomplicated: Secondary | ICD-10-CM | POA: Diagnosis present

## 2018-06-15 DIAGNOSIS — F1410 Cocaine abuse, uncomplicated: Secondary | ICD-10-CM | POA: Diagnosis present

## 2018-06-15 DIAGNOSIS — I10 Essential (primary) hypertension: Secondary | ICD-10-CM | POA: Diagnosis present

## 2018-06-15 DIAGNOSIS — T50901A Poisoning by unspecified drugs, medicaments and biological substances, accidental (unintentional), initial encounter: Secondary | ICD-10-CM

## 2018-06-15 DIAGNOSIS — J449 Chronic obstructive pulmonary disease, unspecified: Secondary | ICD-10-CM | POA: Diagnosis present

## 2018-06-15 DIAGNOSIS — I959 Hypotension, unspecified: Secondary | ICD-10-CM | POA: Diagnosis present

## 2018-06-15 DIAGNOSIS — F419 Anxiety disorder, unspecified: Secondary | ICD-10-CM

## 2018-06-15 DIAGNOSIS — E162 Hypoglycemia, unspecified: Secondary | ICD-10-CM | POA: Diagnosis present

## 2018-06-15 DIAGNOSIS — Z90710 Acquired absence of both cervix and uterus: Secondary | ICD-10-CM

## 2018-06-15 DIAGNOSIS — F418 Other specified anxiety disorders: Secondary | ICD-10-CM | POA: Diagnosis present

## 2018-06-15 DIAGNOSIS — R1310 Dysphagia, unspecified: Secondary | ICD-10-CM

## 2018-06-15 LAB — RAPID URINE DRUG SCREEN, HOSP PERFORMED
Amphetamines: POSITIVE — AB
Barbiturates: NOT DETECTED
Benzodiazepines: POSITIVE — AB
Cocaine: POSITIVE — AB
Opiates: NOT DETECTED
Tetrahydrocannabinol: NOT DETECTED

## 2018-06-15 LAB — BLOOD GAS, ARTERIAL
Acid-base deficit: 0.3 mmol/L (ref 0.0–2.0)
Bicarbonate: 23.2 mmol/L (ref 20.0–28.0)
FIO2: 28
O2 Saturation: 96.4 %
Patient temperature: 37
pCO2 arterial: 59.9 mmHg — ABNORMAL HIGH (ref 32.0–48.0)
pH, Arterial: 7.26 — ABNORMAL LOW (ref 7.350–7.450)
pO2, Arterial: 101 mmHg (ref 83.0–108.0)

## 2018-06-15 LAB — COMPREHENSIVE METABOLIC PANEL
ALT: 17 U/L (ref 0–44)
AST: 18 U/L (ref 15–41)
Albumin: 4.1 g/dL (ref 3.5–5.0)
Alkaline Phosphatase: 73 U/L (ref 38–126)
Anion gap: 11 (ref 5–15)
BUN: 9 mg/dL (ref 6–20)
CO2: 26 mmol/L (ref 22–32)
Calcium: 9 mg/dL (ref 8.9–10.3)
Chloride: 99 mmol/L (ref 98–111)
Creatinine, Ser: 0.75 mg/dL (ref 0.44–1.00)
GFR calc Af Amer: >60 mL/min (ref >60)
GFR calc non Af Amer: >60 mL/min (ref >60)
GLUCOSE: 91 mg/dL (ref 70–99)
Potassium: 3.9 mmol/L (ref 3.5–5.1)
Sodium: 136 mmol/L (ref 135–145)
Total Bilirubin: 0.6 mg/dL (ref 0.3–1.2)
Total Protein: 7.5 g/dL (ref 6.5–8.1)

## 2018-06-15 LAB — URINALYSIS, ROUTINE W REFLEX MICROSCOPIC
Bacteria, UA: NONE SEEN
Bilirubin Urine: NEGATIVE
Glucose, UA: NEGATIVE mg/dL
Hgb urine dipstick: NEGATIVE
Ketones, ur: NEGATIVE mg/dL
Nitrite: NEGATIVE
PROTEIN: NEGATIVE mg/dL
Specific Gravity, Urine: 1.008 (ref 1.005–1.030)
pH: 5 (ref 5.0–8.0)

## 2018-06-15 LAB — CBC
HCT: 39.8 % (ref 36.0–46.0)
Hemoglobin: 12.9 g/dL (ref 12.0–15.0)
MCH: 32.3 pg (ref 26.0–34.0)
MCHC: 32.4 g/dL (ref 30.0–36.0)
MCV: 99.7 fL (ref 80.0–100.0)
NRBC: 0 % (ref 0.0–0.2)
PLATELETS: 256 10*3/uL (ref 150–400)
RBC: 3.99 MIL/uL (ref 3.87–5.11)
RDW: 12.8 % (ref 11.5–15.5)
WBC: 8 10*3/uL (ref 4.0–10.5)

## 2018-06-15 LAB — BLOOD GAS, VENOUS
Acid-Base Excess: 3.4 mmol/L — ABNORMAL HIGH (ref 0.0–2.0)
Acid-Base Excess: 2.9 mmol/L — ABNORMAL HIGH (ref 0.0–2.0)
Bicarbonate: 25.7 mmol/L (ref 20.0–28.0)
Bicarbonate: 24.6 mmol/L (ref 20.0–28.0)
O2 CONTENT: 28 L/min
O2 Content: 28 L/min
O2 SAT: 90 %
O2 Saturation: 50.8 %
PH VEN: 7.249 — AB (ref 7.250–7.430)
PO2 VEN: 67.3 mmHg — AB (ref 32.0–45.0)
Patient temperature: 37
Patient temperature: 37
pCO2, Ven: 65.4 mmHg — ABNORMAL HIGH (ref 44.0–60.0)
pCO2, Ven: 70 mmHg — ABNORMAL HIGH (ref 44.0–60.0)
pH, Ven: 7.279 (ref 7.250–7.430)
pO2, Ven: 37.8 mmHg (ref 32.0–45.0)

## 2018-06-15 LAB — I-STAT BETA HCG BLOOD, ED (MC, WL, AP ONLY): I-stat hCG, quantitative: <5 m[IU]/mL (ref <5)

## 2018-06-15 LAB — SALICYLATE LEVEL: Salicylate Lvl: <7 mg/dL (ref 2.8–30.0)

## 2018-06-15 LAB — ACETAMINOPHEN LEVEL: Acetaminophen (Tylenol), Serum: <10 ug/mL — ABNORMAL LOW (ref 10–30)

## 2018-06-15 LAB — LACTIC ACID, PLASMA: Lactic Acid, Venous: 1.7 mmol/L (ref 0.5–1.9)

## 2018-06-15 LAB — TROPONIN I: Troponin I: <0.03 ng/mL (ref <0.03)

## 2018-06-15 LAB — ETHANOL: Alcohol, Ethyl (B): <10 mg/dL (ref <10)

## 2018-06-15 MED ORDER — NALOXONE HCL 2 MG/2ML IJ SOSY: 1.0000 mg | PREFILLED_SYRINGE | INTRAMUSCULAR | Status: DC | PRN

## 2018-06-15 MED ORDER — NALOXONE HCL 2 MG/2ML IJ SOSY
1.0000 mg | PREFILLED_SYRINGE | Freq: Once | INTRAMUSCULAR | Status: AC
  Administered 2018-06-15: 1 mg via INTRAVENOUS

## 2018-06-15 MED ORDER — SODIUM CHLORIDE 0.9 % IV BOLUS: 500.0000 mL | Freq: Once | INTRAVENOUS | Status: DC

## 2018-06-15 MED ORDER — SODIUM CHLORIDE 0.9 % IV BOLUS
1000.0000 mL | Freq: Once | INTRAVENOUS | Status: AC
  Administered 2018-06-15: 1000 mL via INTRAVENOUS

## 2018-06-15 MED ORDER — KCL IN DEXTROSE-NACL 20-5-0.9 MEQ/L-%-% IV SOLN
INTRAVENOUS | Status: DC
  Administered 2018-06-16: 01:00:00 via INTRAVENOUS
  Filled 2018-06-15 (×2): qty 1000

## 2018-06-15 MED ORDER — IPRATROPIUM-ALBUTEROL 0.5-2.5 (3) MG/3ML IN SOLN
3.0000 mL | RESPIRATORY_TRACT | Status: DC | PRN
  Administered 2018-06-15: 3 mL via RESPIRATORY_TRACT
  Filled 2018-06-15: qty 3

## 2018-06-15 MED ORDER — ACETAMINOPHEN 325 MG PO TABS: 650.0000 mg | ORAL_TABLET | Freq: Four times a day (QID) | ORAL | Status: DC | PRN

## 2018-06-15 MED ORDER — ONDANSETRON HCL 4 MG PO TABS: 4.0000 mg | ORAL_TABLET | Freq: Four times a day (QID) | ORAL | Status: DC | PRN

## 2018-06-15 MED ORDER — POLYETHYLENE GLYCOL 3350 17 G PO PACK: 17.0000 g | PACK | Freq: Every day | ORAL | Status: DC | PRN

## 2018-06-15 MED ORDER — ENOXAPARIN SODIUM 40 MG/0.4ML ~~LOC~~ SOLN
40.0000 mg | SUBCUTANEOUS | Status: DC
  Administered 2018-06-16: 40 mg via SUBCUTANEOUS
  Filled 2018-06-15: qty 0.4

## 2018-06-15 MED ORDER — SODIUM CHLORIDE 0.9 % IV BOLUS
500.0000 mL | Freq: Once | INTRAVENOUS | Status: AC
  Administered 2018-06-15: 500 mL via INTRAVENOUS

## 2018-06-15 MED ORDER — ONDANSETRON HCL 4 MG/2ML IJ SOLN: 4.0000 mg | Freq: Four times a day (QID) | INTRAMUSCULAR | Status: DC | PRN

## 2018-06-15 MED ORDER — ACETAMINOPHEN 650 MG RE SUPP: 650.0000 mg | Freq: Four times a day (QID) | RECTAL | Status: DC | PRN

## 2018-06-15 MED ORDER — SODIUM CHLORIDE 0.9 % IV SOLN
Freq: Once | INTRAVENOUS | Status: AC
  Administered 2018-06-15: 18:00:00 via INTRAVENOUS

## 2018-06-15 NOTE — ED Notes (Signed)
When pt was asked if she took any drugs today, pt stated, "Yes, one long suboxone". RN asked pt if she did it to hurt or kill herself. Pt stated, "no, just to get high". Dr. Pilar Plate informed.

## 2018-06-15 NOTE — ED Provider Notes (Signed)
[facility] Emergency Department Provider Note MRN:  [id number]  Arrival date & time: 06/15/18     Chief Complaint   Drug Overdose   History of Present Illness   Yolanda Adams is a 53 y.o. year-old female with a history of polysubstance abuse, COPD, hypertension presenting to the ED with chief complaint of altered mental status.  Patient coming from jail, report of testing positive for cocaine recently, altered and largely unresponsive, with EMS was cyanotic, reported respiratory failure requiring bag-valve-mask ventilation, 2 mg of Narcan with mild improvement.  I was unable to obtain an accurate HPI, PMH, or ROS due to the patient's altered mental status.  Review of Systems  Positive for altered mental status, respiratory failure.  Patient's Health History    Past Medical History:  Diagnosis Date  . Anxiety and depression   . Chronic kidney disease    small rt kidney stone  . COPD (chronic obstructive pulmonary disease) (HCC)   . DDD (degenerative disc disease)   . GERD (gastroesophageal reflux disease)   . Helicobacter pylori gastritis June 2011   treated with Pylera  . HTN (hypertension)   . IBS (irritable bowel syndrome)   . Peptic stricture of esophagus    dilated twice 10/2009  . PUD (peptic ulcer disease)   . Renal insufficiency   . Seizures (HCC)    had 1 episode of it about 5 years ago, no other hx of seizures  . Shortness of breath    continuous  . Shoulder pain     Past Surgical History:  Procedure Laterality Date  . ABDOMINAL HYSTERECTOMY    . BLADDER SURGERY     x 3 in [place]-strected twice and had bladder sling placed  . CHOLECYSTECTOMY    . ESOPHAGOGASTRODUODENOSCOPY  10/26/09 GASTRITIS/DUODENITIS   Distal esol stricture/dil 15-16 mm. A 17 mm dilator would not pass  . ILEOColonoscopy  04/15/2011   SLF: Colitis in the sigmoid colon/Internal hemorrhoids  . OOPHORECTOMY     Right  . SAVORY DILATION  04/15/2011   XYB:FXOVANVB  gastritis/Duodenitis  . steroid shot in back    . UPPER GASTROINTESTINAL ENDOSCOPY  JUN 14, 2011DYSPHAGIA, EPIG PAIN   H PYLORI GASTRITIS    Family History  Problem Relation Age of Onset  . Prostate cancer Father        Partial gastrectomy  . Hypertension Father   . Stroke Father   . Colon cancer Neg Hx   . Anesthesia problems Neg Hx   . Hypotension Neg Hx   . Malignant hyperthermia Neg Hx   . Pseudochol deficiency Neg Hx     Social History   Socioeconomic History  . Marital status: Widowed    Spouse name: Not on file  . Number of children: 1  . Years of education: Not on file  . Highest education level: Not on file  Occupational History    Employer: UNEMPLOYED  Social Needs  . Financial resource strain: Not on file  . Food insecurity:    Worry: Not on file    Inability: Not on file  . Transportation needs:    Medical: Not on file    Non-medical: Not on file  Tobacco Use  . Smoking status: Current Every Day Smoker    Packs/day: 1.00    Years: 41.00    Pack years: 41.00    Types: Cigarettes  . Smokeless tobacco: Never Used  Substance and Sexual Activity  . Alcohol use: No  . Drug use:  No    Comment: heroin   . Sexual activity: Never    Birth control/protection: Surgical  Lifestyle  . Physical activity:    Days per week: Not on file    Minutes per session: Not on file  . Stress: Not on file  Relationships  . Social connections:    Talks on phone: Not on file    Gets together: Not on file    Attends religious service: Not on file    Active member of club or organization: Not on file    Attends meetings of clubs or organizations: Not on file    Relationship status: Not on file  . Intimate partner violence:    Fear of current or ex partner: Not on file    Emotionally abused: Not on file    Physically abused: Not on file    Forced sexual activity: Not on file  Other Topics Concern  . Not on file  Social History Narrative  . Not on file     Physical  Exam  Vital Signs and Nursing Notes reviewed Vitals:   06/15/18 1630 06/15/18 1758  BP: (!) 98/54   Pulse: 66   Resp: 11   Temp:    SpO2: 100% 100%    CONSTITUTIONAL: Ill-appearing, NAD NEURO: Somnolent, opens eyes and makes words in response to pain, moves all extremities in response to pain EYES:  eyes equal and reactive, pupils 2 to 3 mm ENT/NECK:  no LAD, no JVD CARDIO: Regular rate, well-perfused, normal S1 and S2 PULM:  CTAB no wheezing or rhonchi GI/GU:  normal bowel sounds, non-distended, non-tender MSK/SPINE:  No gross deformities, no edema SKIN:  no rash, atraumatic PSYCH: Sedated, unable to assess  Diagnostic and Interventional Summary    EKG Interpretation  Date/Time:  Monday June 15 2018 15:00:06 EST Ventricular Rate:  68 PR Interval:    QRS Duration: 92 QT Interval:  400 QTC Calculation: 426 R Axis:   56 Text Interpretation:  Sinus rhythm Confirmed by Kennis CarinaBero, Jayveon Convey [phone]) on 06/15/2018 3:27:43 PM      Labs Reviewed  URINALYSIS, ROUTINE W REFLEX MICROSCOPIC - Abnormal; Notable for the following components:      Result Value   APPearance HAZY (*)    Leukocytes, UA SMALL (*)    All other components within normal limits  ACETAMINOPHEN LEVEL - Abnormal; Notable for the following components:   Acetaminophen (Tylenol), Serum <10 (*)    All other components within normal limits  BLOOD GAS, VENOUS - Abnormal; Notable for the following components:   pCO2, Ven 65.4 (*)    pO2, Ven 67.3 (*)    Acid-Base Excess 3.4 (*)    All other components within normal limits  BLOOD GAS, VENOUS - Abnormal; Notable for the following components:   pH, Ven 7.249 (*)    pCO2, Ven 70.0 (*)    Acid-Base Excess 2.9 (*)    All other components within normal limits  CBC  COMPREHENSIVE METABOLIC PANEL  LACTIC ACID, PLASMA  TROPONIN I  ETHANOL  SALICYLATE LEVEL  RAPID URINE DRUG SCREEN, HOSP PERFORMED  I-STAT BETA HCG BLOOD, ED ([facility], [facility], [facility] ONLY)    CT HEAD WO CONTRAST    Final Result    DG Chest Port 1 View  Final Result      Medications  naloxone (NARCAN) injection 1 mg (has no administration in time range)  0.9 %  sodium chloride infusion (has no administration in time range)  sodium chloride 0.9 % bolus  1,000 mL (0 mLs Intravenous Stopped 06/15/18 1624)  naloxone (NARCAN) injection 1 mg (1 mg Intravenous Given 06/15/18 1457)  naloxone (NARCAN) injection 1 mg (1 mg Intravenous Given 06/15/18 1603)  sodium chloride 0.9 % bolus 1,000 mL (0 mLs Intravenous Stopped 06/15/18 1740)     Procedures Critical Care Critical Care Documentation Critical care time provided by me (excluding procedures): 44 minutes  Condition necessitating critical care: Hypercarbic respiratory failure  Components of critical care management: reviewing of prior records, laboratory and imaging interpretation, frequent re-examination and reassessment of vital signs, administration of IV fluids, IV Narcan, noninvasive positive pressure ventilation.    ED Course and Medical Decision Making  I have reviewed the triage vital signs and the nursing notes.  Pertinent labs & imaging results that were available during my care of the patient were reviewed by me and considered in my medical decision making (see below for details).  Altered mental status in this 53 year old female with history of polysubstance abuse, favoring drug overdose, question of opioids versus benzodiazepines versus her prescribed Flexeril or gabapentin, patient exhibits no hyperreflexia or signs of serotonin syndrome, vital signs are within normal limits, respiratory rate here 8 to 10/min, capnography measuring CO2 at about 50, 100% saturations on room air, protecting airway, work-up pending.  Will obtain CT head to exclude intracranial hemorrhage.  Clinical Course as of Jun 16 1803  Mon Jun 15, 2018  1559 VBG reveals respiratory acidosis with a PCO2 of 65.  However on exam patient has looking improved, at times opening  eyes spontaneously, capnography continues to read a CO2 of 40.  Patient continues to breathe 8-10 times per minute, will provide 1 more milligram Narcan and repeat VBG in 1 to 2 hours.   [MB]  1756 PCO2 mildly worsening.  Luckily, upon reassessment patient wakes easily, alert and oriented x3, much more appropriate for noninvasive positive pressure ventilation.  Explains that she was just "trying to get high, took bupenorphine.  Will admit to hospitalist service   [MB]    Clinical Course User Index [MB] Pilar Plate Elmer Sow, MD    Admitted to hospitalist service for further care.  Elmer Sow. Pilar Plate, MD [facility] Emergency Medicine [facility] [email or web address]  Final Clinical Impressions(s) / ED Diagnoses     ICD-10-CM   1. Accidental drug overdose, initial encounter T50.901A   2. Respiratory failure (HCC) J96.90 DG Chest [facility] 1 View    DG Chest Port 1 View  3. Opiate overdose, accidental or unintentional, initial encounter [facility]) T40.601A     ED Discharge Orders    None         Sabas Sous, MD 06/15/18 1805

## 2018-06-15 NOTE — ED Triage Notes (Signed)
[facility], pt had just got to jail and went unresponsive. Pt positive for cocaine, pt having periods of apnea per EMS. Pt given Narcan 2 mg prior to arrival.

## 2018-06-15 NOTE — H&P (Addendum)
History and Physical    Yolanda GoodpastureVicky G Ice ZDG:[id number] DOB: 19-Jun-1965 DOA: 06/15/2018  PCP: Vertis KelchAllen, Debra, NP   Patient coming from: Home  I have personally briefly reviewed patient's old medical records in [facility]  Chief Complaint: Unresponsiveness  HPI: Yolanda Adams is a 53 y.o. female with medical history significant for COPD, seizures, anxiety and depression polysubstance abuse, peptic ulcer disease, who was brought via EMS from jail with reports of unresponsiveness.  Patient at the time of my evaluation is on BiPAP, altered, initially required pain stimulation to get a response, became awake and responded to a few questions, but subsequently went back to sleep.  History is obtained from chart review and EDP.  EMS patient tested positive for cocaine recently, was cyanotic with respiratory failure requiring bag-valve , enroute patient was given 2 mg of Narcan with mild improvement. When patient became awake she reported that she took "one long suboxone", but denied trying to intentionally hurt or kill herself.  [facility] ED[facility] that there were several bottles of pills in the patient's house, and patient ingested something before she was taken to the jail, where she subsequently became unresponsive.  ED Course: Respiratory rate 7-11, blood pressure systolic 91-1 30, heart rate 60s to 70s.  On BiPAP.  Head CT negative for acute intracranial abnormality.  Portable chest x-ray-no acute abnormality.  Tylenol salicylate lead alcohol level negative.  Normal lactic acid.  UA small leukocytes 11-20 bacteria.  UDS pending.  Total of 2 mg of Narcan given in ED, with improvement in colour, but not much improvement in mental status.   Review of Systems: Unable to obtain due to patient's mental status  Past Medical History:  Diagnosis Date  . Anxiety and depression   . Chronic kidney disease    small rt kidney stone  . COPD (chronic obstructive  pulmonary disease) (HCC)   . DDD (degenerative disc disease)   . GERD (gastroesophageal reflux disease)   . Helicobacter pylori gastritis June 2011   treated with Pylera  . HTN (hypertension)   . IBS (irritable bowel syndrome)   . Peptic stricture of esophagus    dilated twice 10/2009  . PUD (peptic ulcer disease)   . Renal insufficiency   . Seizures (HCC)    had 1 episode of it about 5 years ago, no other hx of seizures  . Shortness of breath    continuous  . Shoulder pain     Past Surgical History:  Procedure Laterality Date  . ABDOMINAL HYSTERECTOMY    . BLADDER SURGERY     x 3 in [place]-strected twice and had bladder sling placed  . CHOLECYSTECTOMY    . ESOPHAGOGASTRODUODENOSCOPY  10/26/09 GASTRITIS/DUODENITIS   Distal esol stricture/dil 15-16 mm. A 17 mm dilator would not pass  . ILEOColonoscopy  04/15/2011   SLF: Colitis in the sigmoid colon/Internal hemorrhoids  . OOPHORECTOMY     Right  . SAVORY DILATION  04/15/2011   VZD:GLOVFIEPSLF:Moderate gastritis/Duodenitis  . steroid shot in back    . UPPER GASTROINTESTINAL ENDOSCOPY  JUN 14, 2011DYSPHAGIA, EPIG PAIN   H PYLORI GASTRITIS     reports that she has been smoking cigarettes. She has a 41.00 pack-year smoking history. She has never used smokeless tobacco. She reports that she does not drink alcohol or use drugs.  Allergies  Allergen Reactions  . Aspirin Nausea And Vomiting and Other (See Comments)    Avoid ASA due to history  ulcers (PUD)  . Flagyl [Metronidazole] Nausea Only  . Sudafed [Pseudoephedrine Hcl] Nausea Only  . Toradol [Ketorolac Tromethamine] Other (See Comments)    MIGRAINE    Family History  Problem Relation Age of Onset  . Prostate cancer Father        Partial gastrectomy  . Hypertension Father   . Stroke Father   . Colon cancer Neg Hx   . Anesthesia problems Neg Hx   . Hypotension Neg Hx   . Malignant hyperthermia Neg Hx   . Pseudochol deficiency Neg Hx     Prior to Admission medications     Medication Sig Start Date End Date Taking? Authorizing Provider  Buprenorphine HCl-Naloxone HCl 8-2 MG FILM Place 2 Film under the tongue daily.    Yes [provider]  citalopram (CELEXA) 40 MG tablet Take 40 mg by mouth daily.   Yes [provider]  clonazePAM (KLONOPIN) 0.5 MG tablet Take 0.5 mg by mouth 4 (four) times daily.   Yes [provider]  cyclobenzaprine (FLEXERIL) 10 MG tablet Take 10 mg by mouth 2 (two) times daily as needed for muscle spasms.    Yes [provider]  diazepam (VALIUM) 10 MG tablet Take 10 mg by mouth 3 (three) times daily.   Yes [provider]  gabapentin (NEURONTIN) 600 MG tablet Take 600 mg by mouth 4 (four) times daily.    Yes [provider]  hydrochlorothiazide (HYDRODIURIL) 25 MG tablet Take 25 mg by mouth daily.    Yes [provider]  Lisdexamfetamine Dimesylate (VYVANSE) 40 MG CHEW Chew 40 mg by mouth every morning.   Yes [provider]  naproxen (NAPROSYN) 500 MG tablet Take 500 mg by mouth 2 (two) times daily with a meal.   Yes [provider]  risperiDONE (RISPERDAL) 1 MG tablet Take 1 mg by mouth daily.    Yes [provider]  traZODone (DESYREL) 50 MG tablet Take 50 mg by mouth at bedtime as needed for sleep.   Yes [provider]  acetaminophen (TYLENOL EX ST ARTHRITIS PAIN) 500 MG tablet Take 500 mg by mouth every 6 (six) hours as needed for moderate pain.     [provider]  albuterol (PROVENTIL HFA;VENTOLIN HFA) 108 (90 BASE) MCG/ACT inhaler Inhale 2 puffs into the lungs 4 (four) times daily.     [provider]  chlordiazePOXIDE (LIBRIUM) 25 MG capsule Take 25 mg by mouth See admin instructions. Take 50mg  twice daily for 3 days, then 25mg  twice daily for 3 days, then 25 mg nightly for 3 days, then ok.    [provider]  ibuprofen (ADVIL,MOTRIN) 200 MG tablet Take 600 mg by mouth 2 (two) times daily.     [provider]  LOPERAMIDE HCL PO Take 1 tablet by mouth 2 (two) times daily.    [provider]  mometasone-formoterol (DULERA) 200-5 MCG/ACT AERO Inhale 2 puffs into the lungs daily as needed for wheezing.    [provider]  promethazine (PHENERGAN) 25 MG tablet Take 1 tablet (25 mg total) by mouth every 6 (six) hours as needed for nausea or vomiting. 05/22/18   Samuel Jester, DO    Physical Exam: Limited by patient's mental status. Vitals:   06/15/18 1700 06/15/18 1730 06/15/18 1758 06/15/18 1800  BP: (!) 100/49 (!) 90/57  92/60  Pulse: 71 64  61  Resp: (!) 9 (!) 9  (!) 8  Temp:      TempSrc:  SpO2: 100% 100% 100% 100%  Weight:        Constitutional: On bipap, responded to pain, then to vigourous touch, subsequently dropped back to sleep. Vitals:   06/15/18 1700 06/15/18 1730 06/15/18 1758 06/15/18 1800  BP: (!) 100/49 (!) 90/57  92/60  Pulse: 71 64  61  Resp: (!) 9 (!) 9  (!) 8  Temp:      TempSrc:      SpO2: 100% 100% 100% 100%  Weight:       Eyes: PERRL, lids and conjunctivae normal ENMT: On BIPAP  Neck: normal, supple, no masses, no thyromegaly Respiratory: reduced respiratory rate, ON BIPAP, equal air entry  Cardiovascular: Regular rate and rhythm, no murmurs / rubs / gallops. No extremity edema. 2+ pedal pulses.   Abdomen: No Tenderness, no masses palpated. No hepatosplenomegaly. Bowel sounds positive.  Musculoskeletal: no clubbing / cyanosis. No joint deformity upper and lower extremities. Good ROM, no contractures. Normal muscle tone.  Skin: no rashes, lesions, ulcers. No induration Neurologic: No obvious or nerve abnormality moving all extremities spontaneously  Psychiatric: Responds to pain and vigorous touch  Labs on Admission: I have personally reviewed following labs and imaging studies  CBC: Recent Labs  Lab 06/15/18 1543  WBC 8.0  HGB 12.9  HCT 39.8  MCV 99.7  PLT 256   Basic Metabolic Panel: Recent Labs  Lab  06/15/18 1543  NA 136  K 3.9  CL 99  CO2 26  GLUCOSE 91  BUN 9  CREATININE 0.75  CALCIUM 9.0   Liver Function Tests: Recent Labs  Lab 06/15/18 1543  AST 18  ALT 17  ALKPHOS 73  BILITOT 0.6  PROT 7.5  ALBUMIN 4.1   Cardiac Enzymes: Recent Labs  Lab 06/15/18 1543  TROPONINI <0.03   Urine analysis:    Component Value Date/Time   COLORURINE YELLOW 06/15/2018 1510   APPEARANCEUR HAZY (A) 06/15/2018 1510   LABSPEC 1.008 06/15/2018 1510   PHURINE 5.0 06/15/2018 1510   GLUCOSEU NEGATIVE 06/15/2018 1510   HGBUR NEGATIVE 06/15/2018 1510   BILIRUBINUR NEGATIVE 06/15/2018 1510   KETONESUR NEGATIVE 06/15/2018 1510   PROTEINUR NEGATIVE 06/15/2018 1510   UROBILINOGEN 0.2 09/22/2014 1045   NITRITE NEGATIVE 06/15/2018 1510   LEUKOCYTESUR SMALL (A) 06/15/2018 1510    Radiological Exams on Admission: Ct Head Wo Contrast  Result Date: 06/15/2018 CLINICAL DATA:  Unresponsive in jail. EXAM: CT HEAD WITHOUT CONTRAST TECHNIQUE: Contiguous axial images were obtained from the base of the skull through the vertex without intravenous contrast. COMPARISON:  11/27/2014. FINDINGS: Brain: No evidence for acute infarction, hemorrhage, mass lesion, hydrocephalus, or extra-axial fluid. Normal for age cerebral volume. No significant white matter disease. Vascular: Calcification of the cavernous internal carotid arteries consistent with cerebrovascular atherosclerotic disease. No signs of intracranial large vessel occlusion. Skull: Intact. Sinuses/Orbits: BILATERAL ethmoid sinus disease. Negative orbits. Other: None. IMPRESSION: Negative exam. No acute or focal intracranial abnormality. Similar appearance to priors. Electronically Signed   By: Elsie Stain M.D.   On: 06/15/2018 16:46   Dg Chest Port 1 View  Result Date: 06/15/2018 CLINICAL DATA:  Respiratory failure EXAM: PORTABLE CHEST 1 VIEW COMPARISON:  05/22/2018 FINDINGS: Interval borderline to mild cardiomegaly, likely augmented by portable  technique. No pleural effusion. No edema. No focal airspace disease or pneumothorax IMPRESSION: Interval borderline to mild cardiomegaly partially augmented by portable technique. Negative for edema or focal pulmonary infiltrate. Electronically Signed   By: Jasmine Pang M.D.   On: 06/15/2018 15:30  EKG: Independently reviewed.   Assessment/Plan Active Problems:   Unresponsiveness  Unresponsiveness-with respiratory depression, likely from benzodiazepine coingestion, and opioid intoxication- ?buprenorphine, doubt suboxone would cause this. Home med lists several benzos,long-acting, Librium, Klonopin, diazepam.  Currently on BiPAP.  No significant response to 2mg  X1 and then total of 4 mg Narcan given.  Per UTD, respiratory depression from buprenorphine can be hard to reverse with naloxone. Likely coingestants- benzos are causing ongoing mental and respiratory depression. - As patient is maintaining saturations, and initiating breaths, will hold off on further Narcan doses and allow medications benzos ingested to wear off. -Follow-up UDS-positive for cocaine, benzos and amphetamines.  No opiates. ???doubt buprenorphine shows in UDS? - NPO - IVF N/s D5 + 20 KCl 100cc/hr x 15 hrs - Social work consult in a.m - Psych Consult- Order placed -Hypotensive blood pressure 70s to 80s, repeat 1 L bolus, totaling 4 L. - ABG- respiratory acidosis with pH 7.26, Pco2- 59.9, mental status has improved patient waking up to voice, anticipate improvement in acidosis, continue BiPAP.  Seizures, anxiety depression-medication lists Vyvanse, risperidone, trazodone, gabapentin, Celexa, Klonopin, Valium, Librium.  Unable to confirm medication 2/2 mental status -Awaiting med reconciliation  Polysubstance abuse-cocaine, opioids. Negative alcohol level.  Reported positive screen for cocaine prior to admission. -Follow-up UDS  COPD-Stable  HTN-  - Hold Home HCTZ.  DVT prophylaxis: Lovenox Code Status: Full Family  Communication: None at bedside Disposition Plan: Per rounding team Consults called: Psych Admission status: Obs, step down  Onnie Boer MD [facility]  06/15/2018, 11:25 PM

## 2018-06-16 DIAGNOSIS — R4189 Other symptoms and signs involving cognitive functions and awareness: Secondary | ICD-10-CM | POA: Diagnosis not present

## 2018-06-16 DIAGNOSIS — R1314 Dysphagia, pharyngoesophageal phase: Secondary | ICD-10-CM | POA: Diagnosis present

## 2018-06-16 DIAGNOSIS — I959 Hypotension, unspecified: Secondary | ICD-10-CM | POA: Diagnosis present

## 2018-06-16 DIAGNOSIS — F17210 Nicotine dependence, cigarettes, uncomplicated: Secondary | ICD-10-CM | POA: Diagnosis present

## 2018-06-16 DIAGNOSIS — F419 Anxiety disorder, unspecified: Secondary | ICD-10-CM

## 2018-06-16 DIAGNOSIS — F418 Other specified anxiety disorders: Secondary | ICD-10-CM | POA: Diagnosis present

## 2018-06-16 DIAGNOSIS — R1310 Dysphagia, unspecified: Secondary | ICD-10-CM | POA: Diagnosis not present

## 2018-06-16 DIAGNOSIS — E860 Dehydration: Secondary | ICD-10-CM | POA: Diagnosis not present

## 2018-06-16 DIAGNOSIS — T40601A Poisoning by unspecified narcotics, accidental (unintentional), initial encounter: Secondary | ICD-10-CM

## 2018-06-16 DIAGNOSIS — J449 Chronic obstructive pulmonary disease, unspecified: Secondary | ICD-10-CM | POA: Diagnosis present

## 2018-06-16 DIAGNOSIS — F329 Major depressive disorder, single episode, unspecified: Secondary | ICD-10-CM | POA: Diagnosis present

## 2018-06-16 DIAGNOSIS — R4182 Altered mental status, unspecified: Secondary | ICD-10-CM | POA: Diagnosis present

## 2018-06-16 DIAGNOSIS — T404X1A Poisoning by other synthetic narcotics, accidental (unintentional), initial encounter: Secondary | ICD-10-CM | POA: Diagnosis present

## 2018-06-16 DIAGNOSIS — Z23 Encounter for immunization: Secondary | ICD-10-CM | POA: Diagnosis not present

## 2018-06-16 DIAGNOSIS — F1510 Other stimulant abuse, uncomplicated: Secondary | ICD-10-CM | POA: Diagnosis present

## 2018-06-16 DIAGNOSIS — Z915 Personal history of self-harm: Secondary | ICD-10-CM | POA: Diagnosis not present

## 2018-06-16 DIAGNOSIS — K210 Gastro-esophageal reflux disease with esophagitis: Secondary | ICD-10-CM | POA: Diagnosis not present

## 2018-06-16 DIAGNOSIS — Z90710 Acquired absence of both cervix and uterus: Secondary | ICD-10-CM | POA: Diagnosis not present

## 2018-06-16 DIAGNOSIS — J9690 Respiratory failure, unspecified, unspecified whether with hypoxia or hypercapnia: Secondary | ICD-10-CM

## 2018-06-16 DIAGNOSIS — J9621 Acute and chronic respiratory failure with hypoxia: Secondary | ICD-10-CM | POA: Diagnosis not present

## 2018-06-16 DIAGNOSIS — G4089 Other seizures: Secondary | ICD-10-CM | POA: Diagnosis present

## 2018-06-16 DIAGNOSIS — I10 Essential (primary) hypertension: Secondary | ICD-10-CM | POA: Diagnosis present

## 2018-06-16 DIAGNOSIS — K219 Gastro-esophageal reflux disease without esophagitis: Secondary | ICD-10-CM | POA: Diagnosis present

## 2018-06-16 DIAGNOSIS — F1910 Other psychoactive substance abuse, uncomplicated: Secondary | ICD-10-CM | POA: Diagnosis not present

## 2018-06-16 DIAGNOSIS — E872 Acidosis: Secondary | ICD-10-CM | POA: Diagnosis present

## 2018-06-16 DIAGNOSIS — Z791 Long term (current) use of non-steroidal anti-inflammatories (NSAID): Secondary | ICD-10-CM | POA: Diagnosis not present

## 2018-06-16 DIAGNOSIS — F1310 Sedative, hypnotic or anxiolytic abuse, uncomplicated: Secondary | ICD-10-CM | POA: Diagnosis present

## 2018-06-16 DIAGNOSIS — F1410 Cocaine abuse, uncomplicated: Secondary | ICD-10-CM | POA: Diagnosis present

## 2018-06-16 DIAGNOSIS — E162 Hypoglycemia, unspecified: Secondary | ICD-10-CM | POA: Diagnosis present

## 2018-06-16 DIAGNOSIS — J9600 Acute respiratory failure, unspecified whether with hypoxia or hypercapnia: Secondary | ICD-10-CM | POA: Diagnosis present

## 2018-06-16 LAB — CBC
HCT: 36.2 % (ref 36.0–46.0)
HEMOGLOBIN: 11 g/dL — AB (ref 12.0–15.0)
MCH: 31.5 pg (ref 26.0–34.0)
MCHC: 30.4 g/dL (ref 30.0–36.0)
MCV: 103.7 fL — ABNORMAL HIGH (ref 80.0–100.0)
Platelets: 201 10*3/uL (ref 150–400)
RBC: 3.49 MIL/uL — ABNORMAL LOW (ref 3.87–5.11)
RDW: 13.2 % (ref 11.5–15.5)
WBC: 7.5 10*3/uL (ref 4.0–10.5)
nRBC: 0 % (ref 0.0–0.2)

## 2018-06-16 LAB — GLUCOSE, CAPILLARY
GLUCOSE-CAPILLARY: 105 mg/dL — AB (ref 70–99)
Glucose-Capillary: 62 mg/dL — ABNORMAL LOW (ref 70–99)
Glucose-Capillary: 95 mg/dL (ref 70–99)
Glucose-Capillary: 100 mg/dL — ABNORMAL HIGH (ref 70–99)
Glucose-Capillary: 96 mg/dL (ref 70–99)
Glucose-Capillary: 100 mg/dL — ABNORMAL HIGH (ref 70–99)

## 2018-06-16 LAB — BASIC METABOLIC PANEL
Anion gap: 6 (ref 5–15)
BUN: 7 mg/dL (ref 6–20)
CO2: 27 mmol/L (ref 22–32)
CREATININE: 0.69 mg/dL (ref 0.44–1.00)
Calcium: 8.2 mg/dL — ABNORMAL LOW (ref 8.9–10.3)
Chloride: 108 mmol/L (ref 98–111)
GFR calc Af Amer: >60 mL/min (ref >60)
GFR calc non Af Amer: >60 mL/min (ref >60)
Glucose, Bld: 122 mg/dL — ABNORMAL HIGH (ref 70–99)
Potassium: 3.8 mmol/L (ref 3.5–5.1)
Sodium: 141 mmol/L (ref 135–145)

## 2018-06-16 LAB — MRSA PCR SCREENING: MRSA BY PCR: NEGATIVE

## 2018-06-16 MED ORDER — DEXTROSE 50 % IV SOLN
12.5000 g | Freq: Once | INTRAVENOUS | Status: AC
  Administered 2018-06-16: 12.5 g via INTRAVENOUS

## 2018-06-16 MED ORDER — NOREPINEPHRINE 4 MG/250ML-% IV SOLN
0.0000 ug/min | INTRAVENOUS | Status: DC
  Administered 2018-06-16: 2 ug/min via INTRAVENOUS
  Filled 2018-06-16: qty 250

## 2018-06-16 MED ORDER — PNEUMOCOCCAL VAC POLYVALENT 25 MCG/0.5ML IJ INJ
0.5000 mL | INJECTION | INTRAMUSCULAR | Status: AC
  Administered 2018-06-17: 0.5 mL via INTRAMUSCULAR
  Filled 2018-06-16: qty 0.5

## 2018-06-16 MED ORDER — PANTOPRAZOLE SODIUM 40 MG IV SOLR
40.0000 mg | INTRAVENOUS | Status: DC
  Administered 2018-06-16 – 2018-06-17 (×2): 40 mg via INTRAVENOUS
  Filled 2018-06-16 (×2): qty 40

## 2018-06-16 MED ORDER — DEXTROSE 50 % IV SOLN
INTRAVENOUS | Status: AC
  Administered 2018-06-16: 12.5 g via INTRAVENOUS
  Filled 2018-06-16: qty 50

## 2018-06-16 MED ORDER — SODIUM CHLORIDE 0.9 % IV SOLN: INTRAVENOUS | Status: DC

## 2018-06-16 MED ORDER — INFLUENZA VAC SPLIT QUAD 0.5 ML IM SUSY
0.5000 mL | PREFILLED_SYRINGE | INTRAMUSCULAR | Status: AC
  Administered 2018-06-17: 0.5 mL via INTRAMUSCULAR
  Filled 2018-06-16: qty 0.5

## 2018-06-16 MED ORDER — SODIUM CHLORIDE 0.9 % IV SOLN
INTRAVENOUS | Status: DC
  Administered 2018-06-16: 13:00:00 via INTRAVENOUS

## 2018-06-16 NOTE — Progress Notes (Signed)
Notified hospitalist due to hypotension and hypoglycemia. Orders obtained and completed.

## 2018-06-16 NOTE — Progress Notes (Signed)
CSW reviewed patient's chart and noted that she came to [facility] from jail after taking an apparent overdose of opiates.  CSW called and spoke to patient's RN  In the ICU and to [facility] CSW, and explained that, since patient came from jail, she would be returned to jail regardless of whether she was assessed as needing inpatient treatment, or not.  CSW requested that TTS consult be cancelled until and unless it is determined that patient is not required to return to jail after she recovers from her overdose.  At present, patient is not medically cleared and was not able to participate in a TTS consult earlier today.  Timmothy Euler. Kaylyn Lim, MSW, LCSWA Disposition Clinical Social Work [phone] (cell) [phone] (office)

## 2018-06-16 NOTE — Progress Notes (Signed)
PROGRESS NOTE  Yolanda Adams  KGS:[id number]  DOB: January 20, 1966  DOA: 06/15/2018 PCP: Vertis Kelch, NP  Brief Admission Hx: 53 y.o. female with medical history significant for COPD, seizures, anxiety and depression polysubstance abuse, peptic ulcer disease, who was brought via EMS from jail with reports of unresponsiveness.  MDM/Assessment & Plan:   1. Opioid overdose - Pt admits to taking buprenorphine just prior to being arrested. Her UDS is also positive for benzodiazepines and amphetamines.  Her intentions are unknown at this point.  Her mentation is improved.  I am requesting a TTS evaluation when she is off BiPAP.  Continue supportive therapy 2. Acute respiratory failure -patient has been treated with BiPAP and clinically has been improving.  She is hopeful to get off BiPAP this morning.  Continue to monitor and provide BiPAP as needed. 3. Polysubstance abuse- TTS consult as noted above, social work consult.  Patient is positive for amphetamines cocaine benzos and opioids. 4. COPD- on BiPAP treatment as above 5. Essential hypertension- she is currently hypotensive and holding all antihypertensives. 6. Hypotension - DC levophed infusion this morning.  7. Dehydration - increased IVFs.  8. Hypoglycemia - dextrose in IVFs.  Following CBGs.  9. Anxiety / depression - when more awake will try to obtain home med reconciliation.   DVT prophylaxis: lovenox  Code Status: Full  Family Communication: none present  Disposition Plan: pending TTS evaluation   Consultants:  TTS  Procedures:  bipap   Antimicrobials:  N/a    Subjective: Pt awake, says she wants something to drink and wants bipap removed.   Objective: Vitals:   06/16/18 0545 06/16/18 0600 06/16/18 0735 06/16/18 0742  BP: (!) 88/52 102/68    Pulse: 72 (!) 57  67  Resp: 15 12    Temp:   97.7 F (36.5 C)   TempSrc:      SpO2: 97% 99%  98%  Weight:      Height:        Intake/Output Summary (Last 24 hours) at  06/16/2018 0909 Last data filed at 06/16/2018 0555 Gross per 24 hour  Intake 3975.71 ml  Output -  Net 3975.71 ml   Filed Weights   06/15/18 1459 06/16/18 0133 06/16/18 0500  Weight: 78.9 kg 80.6 kg 80.8 kg   REVIEW OF SYSTEMS  As per history otherwise all reviewed and reported negative  Exam:  General exam: on bipap, awake, alert, NAD.  Respiratory system: on bipap.  No increased work of breathing. Cardiovascular system: S1 & S2 heard. No JVD, murmurs, gallops, clicks or pedal edema. Gastrointestinal system: Abdomen is nondistended, soft and nontender. Normal bowel sounds heard. Central nervous system: Alert and oriented. No focal neurological deficits. Extremities: 1+ edema bilateral LEs.  Data Reviewed: Basic Metabolic Panel: Recent Labs  Lab 06/15/18 1543 06/16/18 0539  NA 136 141  K 3.9 3.8  CL 99 108  CO2 26 27  GLUCOSE 91 122*  BUN 9 7  CREATININE 0.75 0.69  CALCIUM 9.0 8.2*   Liver Function Tests: Recent Labs  Lab 06/15/18 1543  AST 18  ALT 17  ALKPHOS 73  BILITOT 0.6  PROT 7.5  ALBUMIN 4.1   No results for input(s): LIPASE, AMYLASE in the last 168 hours. No results for input(s): AMMONIA in the last 168 hours. CBC: Recent Labs  Lab 06/15/18 1543 06/16/18 0539  WBC 8.0 7.5  HGB 12.9 11.0*  HCT 39.8 36.2  MCV 99.7 103.7*  PLT 256 201   Cardiac Enzymes:  Recent Labs  Lab 06/15/18 1543  TROPONINI <0.03   CBG (last 3)  Recent Labs    06/16/18 0209 06/16/18 0332 06/16/18 0733  GLUCAP 62* 95 100*   Recent Results (from the past 240 hour(s))  MRSA PCR Screening     Status: None   Collection Time: 06/16/18 12:48 AM  Result Value Ref Range Status   MRSA by PCR NEGATIVE NEGATIVE Final    Comment:        The GeneXpert MRSA Assay (FDA approved for NASAL specimens only), is one component of a comprehensive MRSA colonization surveillance program. It is not intended to diagnose MRSA infection nor to guide or monitor treatment for MRSA  infections. Performed at [facility], [address], [place], [place] [address]    Studies: Ct Head Wo Contrast  Result Date: 06/15/2018 CLINICAL DATA:  Unresponsive in jail. EXAM: CT HEAD WITHOUT CONTRAST TECHNIQUE: Contiguous axial images were obtained from the base of the skull through the vertex without intravenous contrast. COMPARISON:  11/27/2014. FINDINGS: Brain: No evidence for acute infarction, hemorrhage, mass lesion, hydrocephalus, or extra-axial fluid. Normal for age cerebral volume. No significant white matter disease. Vascular: Calcification of the cavernous internal carotid arteries consistent with cerebrovascular atherosclerotic disease. No signs of intracranial large vessel occlusion. Skull: Intact. Sinuses/Orbits: BILATERAL ethmoid sinus disease. Negative orbits. Other: None. IMPRESSION: Negative exam. No acute or focal intracranial abnormality. Similar appearance to priors. Electronically Signed   By: Elsie Stain M.D.   On: 06/15/2018 16:46   Dg Chest Port 1 View  Result Date: 06/15/2018 CLINICAL DATA:  Respiratory failure EXAM: PORTABLE CHEST 1 VIEW COMPARISON:  05/22/2018 FINDINGS: Interval borderline to mild cardiomegaly, likely augmented by portable technique. No pleural effusion. No edema. No focal airspace disease or pneumothorax IMPRESSION: Interval borderline to mild cardiomegaly partially augmented by portable technique. Negative for edema or focal pulmonary infiltrate. Electronically Signed   By: Jasmine Pang M.D.   On: 06/15/2018 15:30   Scheduled Meds: . enoxaparin (LOVENOX) injection  40 mg Subcutaneous Q24H  . [START ON 06/17/2018] Influenza vac split quadrivalent PF  0.5 mL Intramuscular Tomorrow-1000  . [START ON 06/17/2018] pneumococcal 23 valent vaccine  0.5 mL Intramuscular Tomorrow-1000   Continuous Infusions: . dextrose 5 % and 0.9 % NaCl with KCl 20 mEq/L 125 mL/hr at 06/16/18 0555  . sodium chloride     Active Problems:   Unresponsiveness  Critical  Care Time spent: 34 minutes   Standley Dakins, MD [facility] 06/16/2018, 9:09 AM    LOS: 0 days  How to contact the [facility] Attending or Consulting provider 7A - 7P or covering provider during after hours 7P -7A, for this patient?  1. Check the care team in [facility] and look for a) attending/consulting TR[facility] provider listed and b) the [facility] team listed 2. Log into [email or web address] and use [place]'s universal password to access. If you do not have the password, please contact the hospital operator. 3. Locate the [facility] provider you are looking for under [facility] and page to a number that you can be directly reached. 4. If you still have difficulty reaching the provider, please page the [facility] (Director on Call) for the Hospitalists listed on amion for assistance.

## 2018-06-16 NOTE — BH Assessment (Signed)
[facility] Assessment Progress Note    TTS unable to see patient at this time due to multiple assessments and walk-ins.  Patient is not medically cleared for discharge today.  Therefore, TTS will most likely not see this patient until this afternoon.

## 2018-06-17 ENCOUNTER — Encounter (HOSPITAL_COMMUNITY): Payer: Self-pay | Admitting: Registered Nurse

## 2018-06-17 DIAGNOSIS — R1310 Dysphagia, unspecified: Secondary | ICD-10-CM

## 2018-06-17 DIAGNOSIS — T40601A Poisoning by unspecified narcotics, accidental (unintentional), initial encounter: Secondary | ICD-10-CM

## 2018-06-17 DIAGNOSIS — J9621 Acute and chronic respiratory failure with hypoxia: Secondary | ICD-10-CM

## 2018-06-17 DIAGNOSIS — K210 Gastro-esophageal reflux disease with esophagitis: Secondary | ICD-10-CM

## 2018-06-17 LAB — GLUCOSE, CAPILLARY
Glucose-Capillary: 89 mg/dL (ref 70–99)
Glucose-Capillary: 89 mg/dL (ref 70–99)
Glucose-Capillary: 94 mg/dL (ref 70–99)

## 2018-06-17 LAB — HIV ANTIBODY (ROUTINE TESTING W REFLEX): HIV SCREEN 4TH GENERATION: NONREACTIVE

## 2018-06-17 MED ORDER — DIAZEPAM 10 MG PO TABS: 5.0000 mg | ORAL_TABLET | Freq: Three times a day (TID) | ORAL | 0 refills | Status: DC

## 2018-06-17 NOTE — Clinical Social Work Note (Signed)
Clinical Social Work Assessment  Patient Details  Name: Yolanda Adams MRN: [id number] Date of Birth: 12/07/1965  Date of referral:  06/17/18               Reason for consult:  Substance Use/ETOH Abuse                Permission sought to share information with:    Permission granted to share information::     Name::        Agency::     Relationship::     Contact Information:     Housing/Transportation Living arrangements for the past 2 months:  Single Family Home Source of Information:  Patient Patient Interpreter Needed:  None Criminal Activity/Legal Involvement Pertinent to Current Situation/Hospitalization:  Yes Significant Relationships:  Parents Lives with:    Do you feel safe going back to the place where you live?  Yes Need for family participation in patient care:  No (Coment)  Care giving concerns:  Pt with AODA concerns.    Social Worker assessment / plan: Received CSW consult to see pt for AODA concerns. Reviewed pt's record today and met with pt at bedside. Pt's mother present at the time of CSW visit with pt's permission. Per pt, her parole officer was here this AM and left her information about court follow up. Pt is not going back to jail at dc. Discussed AODA concerns with pt who states she has never been in treatment in the past. Per pt, her parole officer is working on getting her into treatment at a facility in [place]. Pt states that she does not think she needs any additional resources from this LCSW. Pt states that her mother will be taking her home.   Discussed with Dr. Wynetta Emery who will ask TTS to consult again since pt is not being discharged back to jail. Once TTS consult complete, pt will dc.  Employment status:  Unemployed Forensic scientist:  Other (Comment Required)(Inmate coverage) PT Recommendations:  No Follow Up Information / Referral to community resources:     Patient/Family's Response to care: Pt accepting of care.  Patient/Family's  Understanding of and Emotional Response to Diagnosis, Current Treatment, and Prognosis: Pt appears to understand diagnosis and treatment recommendations. No emotional distress identified.  Emotional Assessment Appearance:  Appears stated age Attitude/Demeanor/Rapport:  Engaged Affect (typically observed):  Pleasant Orientation:  Oriented to Self, Oriented to Place, Oriented to  Time, Oriented to Situation Alcohol / Substance use:  Illicit Drugs Psych involvement (Current and /or in the community):  Yes (Comment)  Discharge Needs  Concerns to be addressed:  Substance Abuse Concerns Readmission within the last 30 days:  No Current discharge risk:  Substance Abuse Barriers to Discharge:  No Barriers Identified   Shade Flood, LCSW 06/17/2018, 10:59 AM

## 2018-06-17 NOTE — Consult Note (Signed)
  Tele psych Assessment   Yolanda Adams, 53 y.o., female patient seen via tele psych by TTS and this provider; chart reviewed and consulted with Dr. Lucianne Muss on 06/17/18.  On evaluation Yolanda Adams reports she was not trying to kill herself; states that she took to much of her Suboxone when on her way to jail.  "I won't trying to kill myself; I would never do that."  Patient states that she lives with her brother and father.  States that on way to jail took more than normal amount of Suboxone not knowing it would hurt her.  Patient prior suicide attempt.  Patient denies suicidal/self/harm/homicidal ideation, psychosis, and paranoia.  States that she currently has outpatient psychiatric services where she is treated for depression and anxiety.  Patient sister Lavada Mesi) at bed side and states that she believes the patient when states it was an accident; states that she feels that her sister is safe to come home and that she wouldn't do anything to try to hurt or kill herself.   During evaluation Yolanda Adams is elevated up in bed; she is alert/oriented x 4; calm/cooperative; and mood congruent with affect.  Patient is speaking in a clear tone at moderate volume, and normal pace; with good eye contact.  Her thought process is coherent and relevant; There is no indication that she is currently responding to internal/external stimuli or experiencing delusional thought content.  Patient denies suicidal/self-harm/homicidal ideation, psychosis, and paranoia.  Patient has remained calm throughout assessment and has answered questions appropriately.     For detailed note see TTS tele assessment note  Recommendations:  Follow up with outpatient psychiatric provider  Disposition:  Patient is psychiatrically cleared  No evidence of imminent risk to self or others at present.   Patient does not meet criteria for psychiatric inpatient admission. Supportive therapy provided about ongoing  stressors. Discussed crisis plan, support from social network, calling 911, coming to the Emergency Department, and calling Suicide Hotline.  TTS informed patient's nurse of above recommendation and disposition; who will inform provider  Assunta Found, NP

## 2018-06-17 NOTE — Discharge Instructions (Signed)
Accidental Drug Poisoning, Adult Accidental drug poisoning happens when a person accidentally takes too much of a substance, such as a prescription medicine, an over-the-counter medicine, a vitamin, a supplement, or an illegal drug. The effects of drug poisoning can be mild, dangerous, or even deadly. What are the causes? This condition is caused by taking too much of a medicine, illegal drug, or other substance. It often results from:  Lack of knowledge about a substance.  Using more than one substance at the same time.  An error made by the health care provider who prescribed the substance.  An error made by the pharmacist who filled the prescription.  A lapse in memory, such as forgetting that you have already taken a dose of the medicine.  Suddenly using a substance after a long period of not using it. The following substances and medicines are more likely to cause an accidental drug poisoning:  Medicines that treat mental problems (psychotropic medicines).  Pain medicines.  Cocaine.  Heroin.  Multivitamins that contain iron.  Over-the-counter cold and cough medicines. What increases the risk? This condition is more likely to occur in:  Elderly adults. Elderly adults are at risk because they may: ? Be taking many different medicines. ? Have difficulty reading labels. ? Forget when they last took their medicine.  People who use illegal drugs.  People who drink alcohol while using illegal drugs or certain medicines.  People with certain mental health conditions. What are the signs or symptoms? Symptoms of this condition depend on the substance and the amount that was taken. Common symptoms include:  Behavior changes, such as confusion.  Sleepiness.  Weakness.  Slowed breathing.  Nausea and vomiting.  Seizures.  Very large or small eye pupil size. A drug poisoning can cause a very serious condition in which your blood pressure drops to a low level  (shock). Symptoms of shock include:  Cold and clammy skin.  Pale skin.  Blue lips.  Very slow breathing.  Extreme sleepiness.  Severe confusion.  Dizziness or fainting. How is this diagnosed? This condition is diagnosed based on:  Your symptoms. You will be asked about the substances you took and when you took them.  A physical exam. You may also have other tests, including:  Urine tests.  Blood tests.  An electrocardiogram (ECG). How is this treated? This condition may need to be treated right away at the hospital. Treatment may involve:  Getting fluids and electrolytes through an IV.  Having a breathing tube inserted in your airway (endotracheal tube) to help you breathe.  Taking medicines. These may include medicines that: ? Absorb any substance that is in your digestive system. ? Block or reverse the effect of the substance that caused the drug poisoning.  Having your blood filtered through an artificial kidney machine (hemodialysis).  Ongoing counseling and mental health support. This may be provided if you used an illegal drug. Follow these instructions at home: Medicines   Take over-the-counter and prescription medicines only as told by your health care provider.  Before taking a new medicine, ask your health care provider whether the medicine: ? May cause side effects. ? Might react with other medicines.  Keep a list of all the medicines that you take, including over-the-counter medicines, vitamins, supplements, and herbs. Bring this list with you to all of your medical visits. General instructions   Drink enough fluid to keep your urine pale yellow.  If you are working with a Social worker or Education officer, community,  make sure to follow his or her instructions.  Do not drink alcohol if: ? Your health care provider tells you not to drink. ? You are pregnant, may be pregnant, or are planning to become pregnant.  If you drink alcohol, limit how  much you have: ? 0-1 drink a day for women. ? 0-2 drinks a day for men.  Be aware of how much alcohol is in your drink. In the [place] one drink equals one typical bottle of beer (12 oz), one-half glass of wine (5 oz), or one shot of hard liquor (1 oz).  Keep all follow-up visits as told by your health care provider. This is important. How is this prevented?   Get help if you are struggling with: ? Alcohol or drug use. ? Depression or another mental health problem.  Keep the phone number of your local poison control center near your phone or on your cell phone. The hotline of the [employer] [facility] is (800581 670 2962) (773) 496-1056.  Store all medicines in safety containers that are out of the reach of children.  Read the drug inserts that come with your medicines.  Create a system for taking your medicine, such as a pillbox, that will help you avoid taking too much of the medicine.  Do not drink alcohol while taking medicines unless your health care provider approves.  Do not use illegal drugs.  Do not take medicines that are not prescribed for you. Contact a health care provider if:  Your symptoms return.  You develop new symptoms or side effects after taking a medicine.  You have questions about possible drug poisoning. Call your local poison control center at 838-417-7801(800) (773) 496-1056. Get help right away if:  You think that you or someone else may have taken too much of a substance.  You or someone else is having symptoms of drug poisoning. Summary  Accidental drug poisoning happens when a person accidentally takes too much of a substance, such as a prescription medicine, an over-the-counter medicine, a vitamin, a supplement, or an illegal drug.  The effects of drug poisoning can be mild, dangerous, or even deadly.  This condition is diagnosed based on your symptoms and a physical exam. You will be asked to tell your health care provider which substances you took and  when you took them.  This condition may need to be treated right away at the hospital. This information is not intended to replace advice given to you by your health care provider. Make sure you discuss any questions you have with your health care provider. Document Released: 07/06/2004 Document Revised: 03/24/2017 Document Reviewed: 03/24/2017 Elsevier Interactive Patient Education  2019 [employer] Chip BoerVicki please follow-up with your outpatient psychiatric provider and your medical physician as soon as possible to review your medications. Please avoid taking any recreational drugs.   Accidental Drug Poisoning, Adult Accidental drug poisoning happens when a person accidentally takes too much of a substance, such as a prescription medicine, an over-the-counter medicine, a vitamin, a supplement, or an illegal drug. The effects of drug poisoning can be mild, dangerous, or even deadly. What are the causes? This condition is caused by taking too much of a medicine, illegal drug, or other substance. It often results from:  Lack of knowledge about a substance.  Using more than one substance at the same time.  An error made by the health care provider who prescribed the substance.  An error made by the pharmacist who filled the prescription.  A  lapse in memory, such as forgetting that you have already taken a dose of the medicine.  Suddenly using a substance after a long period of not using it. The following substances and medicines are more likely to cause an accidental drug poisoning:  Medicines that treat mental problems (psychotropic medicines).  Pain medicines.  Cocaine.  Heroin.  Multivitamins that contain iron.  Over-the-counter cold and cough medicines. What increases the risk? This condition is more likely to occur in:  Elderly adults. Elderly adults are at risk because they may: ? Be taking many different medicines. ? Have difficulty reading labels. ? Forget when they  last took their medicine.  People who use illegal drugs.  People who drink alcohol while using illegal drugs or certain medicines.  People with certain mental health conditions. What are the signs or symptoms? Symptoms of this condition depend on the substance and the amount that was taken. Common symptoms include:  Behavior changes, such as confusion.  Sleepiness.  Weakness.  Slowed breathing.  Nausea and vomiting.  Seizures.  Very large or small eye pupil size. A drug poisoning can cause a very serious condition in which your blood pressure drops to a low level (shock). Symptoms of shock include:  Cold and clammy skin.  Pale skin.  Blue lips.  Very slow breathing.  Extreme sleepiness.  Severe confusion.  Dizziness or fainting. How is this diagnosed? This condition is diagnosed based on:  Your symptoms. You will be asked about the substances you took and when you took them.  A physical exam. You may also have other tests, including:  Urine tests.  Blood tests.  An electrocardiogram (ECG). How is this treated? This condition may need to be treated right away at the hospital. Treatment may involve:  Getting fluids and electrolytes through an IV.  Having a breathing tube inserted in your airway (endotracheal tube) to help you breathe.  Taking medicines. These may include medicines that: ? Absorb any substance that is in your digestive system. ? Block or reverse the effect of the substance that caused the drug poisoning.  Having your blood filtered through an artificial kidney machine (hemodialysis).  Ongoing counseling and mental health support. This may be provided if you used an illegal drug. Follow these instructions at home: Medicines   Take over-the-counter and prescription medicines only as told by your health care provider.  Before taking a new medicine, ask your health care provider whether the medicine: ? May cause side effects. ? Might  react with other medicines.  Keep a list of all the medicines that you take, including over-the-counter medicines, vitamins, supplements, and herbs. Bring this list with you to all of your medical visits. General instructions   Drink enough fluid to keep your urine pale yellow.  If you are working with a counselor or mental health professional, make sure to follow his or her instructions.  Do not drink alcohol if: ? Your health care provider tells you not to drink. ? You are pregnant, may be pregnant, or are planning to become pregnant.  If you drink alcohol, limit how much you have: ? 0-1 drink a day for women. ? 0-2 drinks a day for men.  Be aware of how much alcohol is in your drink. In the [place] one drink equals one typical bottle of beer (12 oz), one-half glass of wine (5 oz), or one shot of hard liquor (1 oz).  Keep all follow-up visits as told by your health care provider. This is  important. How is this prevented?   Get help if you are struggling with: ? Alcohol or drug use. ? Depression or another mental health problem.  Keep the phone number of your local poison control center near your phone or on your cell phone. The hotline of the [employer] is (800(416)638-7999.  Store all medicines in safety containers that are out of the reach of children.  Read the drug inserts that come with your medicines.  Create a system for taking your medicine, such as a pillbox, that will help you avoid taking too much of the medicine.  Do not drink alcohol while taking medicines unless your health care provider approves.  Do not use illegal drugs.  Do not take medicines that are not prescribed for you. Contact a health care provider if:  Your symptoms return.  You develop new symptoms or side effects after taking a medicine.  You have questions about possible drug poisoning. Call your local poison control center at [phone]. Get help right  away if:  You think that you or someone else may have taken too much of a substance.  You or someone else is having symptoms of drug poisoning. Summary  Accidental drug poisoning happens when a person accidentally takes too much of a substance, such as a prescription medicine, an over-the-counter medicine, a vitamin, a supplement, or an illegal drug.  The effects of drug poisoning can be mild, dangerous, or even deadly.  This condition is diagnosed based on your symptoms and a physical exam. You will be asked to tell your health care provider which substances you took and when you took them.  This condition may need to be treated right away at the hospital. This information is not intended to replace advice given to you by your health care provider. Make sure you discuss any questions you have with your health care provider. Document Released: 07/06/2004 Document Revised: 03/24/2017 Document Reviewed: 03/24/2017 Elsevier Interactive Patient Education  2019 [employer]. Seek medical care or return to emergency department if your symptoms come back, worsen or if new problems develop.    IMPORTANT INFORMATION: PAY CLOSE ATTENTION   PHYSICIAN DISCHARGE INSTRUCTIONS  Follow with Primary care provider  Vertis Kelch, NP  and other consultants as instructed your Hospitalist Physician  SEEK MEDICAL CARE OR RETURN TO EMERGENCY ROOM IF SYMPTOMS COME BACK, WORSEN OR NEW PROBLEM DEVELOPS.   Please note: You were cared for by a hospitalist during your hospital stay. Every effort will be made to forward records to your primary care provider.  You can request that your primary care provider send for your hospital records if they have not received them.  Once you are discharged, your primary care physician will handle any further medical issues. Please note that NO REFILLS for any discharge medications will be authorized once you are discharged, as it is imperative that you return to your primary care  physician (or establish a relationship with a primary care physician if you do not have one) for your post hospital discharge needs so that they can reassess your need for medications and monitor your lab values.  Please get a complete blood count and chemistry panel checked by your Primary MD at your next visit, and again as instructed by your Primary MD.  Get Medicines reviewed and adjusted: Please take all your medications with you for your next visit with your Primary MD  Laboratory/radiological data: Please request your Primary MD to go over  all hospital tests and procedure/radiological results at the follow up, please ask your primary care provider to get all Hospital records sent to his/her office.  In some cases, they will be blood work, cultures and biopsy results pending at the time of your discharge. Please request that your primary care provider follow up on these results.  If you are diabetic, please bring your blood sugar readings with you to your follow up appointment with primary care.    Please call and make your follow up appointments as soon as possible.    Also Note the following: If you experience worsening of your admission symptoms, develop shortness of breath, life threatening emergency, suicidal or homicidal thoughts you must seek medical attention immediately by calling 911 or calling your MD immediately  if symptoms less severe.  You must read complete instructions/literature along with all the possible adverse reactions/side effects for all the Medicines you take and that have been prescribed to you. Take any new Medicines after you have completely understood and accpet all the possible adverse reactions/side effects.   Do not drive when taking Pain medications or sleeping medications (Benzodiazepines)  Do not take more than prescribed Pain, Sleep and Anxiety Medications. It is not advisable to combine anxiety,sleep and pain medications without talking with your  primary care practitioner  Special Instructions: If you have smoked or chewed Tobacco  in the last 2 yrs please stop smoking, stop any regular Alcohol  and or any Recreational drug use.  Wear Seat belts while driving.      Opioid Overdose Opioids are substances that relieve pain by binding to pain receptors in your brain and spinal cord. Opioids include illegal drugs, such as heroin, as well as prescription pain medicines.An opioid overdose happens when you take too much of an opioid substance. This can happen with any type of opioid, including:  Heroin.  Morphine.  Codeine.  Methadone.  Oxycodone.  Hydrocodone.  Fentanyl.  Hydromorphone.  Buprenorphine. The effects of an overdose can be mild, dangerous, or even deadly. Opioid overdose is a medical emergency. What are the causes? This condition may be caused by:  Taking too much of an opioid by accident.  Taking too much of an opioid on purpose.  An error made by a health care provider who prescribes a medicine.  An error made by the pharmacist who fills the prescription order.  Using more than one substance that contains opioids at the same time.  Mixing an opioid with a substance that affects your heart, breathing, or blood pressure. These include alcohol, tranquilizers, sleeping pills, illegal drugs, and some over-the-counter medicines. What increases the risk? This condition is more likely in:  Children. They may be attracted to colorful pills. Because of a child's small size, even a small amount of a drug can be dangerous.  Elderly people. They may be taking many different drugs. Elderly people may have difficulty reading labels or remembering when they last took their medicine.  People who take an opioid on a long-term basis.  People who use: ? Illegal drugs. ? Other substances, including alcohol, while using an opioid.  People who have: ? A history of drug or alcohol abuse. ? Certain mental health  conditions.  People who take opioids that are not prescribed for them. What are the signs or symptoms? Symptoms of this condition depend on the type of opioid and the amount that was taken. Common symptoms include:  Sleepiness or difficulty waking from sleep.  Confusion.  Slurred speech.  Slowed breathing and a slow pulse.  Nausea and vomiting.  Abnormally small pupils. Signs and symptoms that require emergency treatment include:  Cold, clammy, and pale skin.  Blue lips and fingernails.  Vomiting.  Gurgling sounds in the throat.  A pulse that is very slow or difficult to detect.  Breathing that is very slow, noisy, or difficult to detect.  Limp body.  Inability to respond to speech or be awakened from sleep (stupor). How is this diagnosed? This condition is diagnosed based on your symptoms. It is important to tell your health care provider:  All of the opioidsthat you took.  When you took the opioids.  Whether you were drinking alcohol or using other substances. Your health care provider will do a physical exam. This exam may include:  Checking and monitoring your heart rate and rhythm, your breathing rate and depth, your temperature, and your blood pressure (vital signs).  Checking for abnormally small pupils.  Measuring oxygen levels in your blood. You may also have blood tests or urine tests. How is this treated? Supporting your vital signs and your breathing is the first step in treating an opioid overdose. Treatment may also include:  Giving fluids and minerals (electrolytes) through an IV tube.  Inserting a breathing tube (endotracheal tube) in your airway to help you breathe.  Giving oxygen.  Passing a tube through your nose and into your stomach (NG tube, or nasogastric tube) to wash out your stomach.  Giving medicines that: ? Increase your blood pressure. ? Absorb any opioid that is in your digestive system. ? Reverse the effects of the opioid  (naloxone).  Ongoing counseling and mental health support if you intentionally overdosed or used an illegal drug. Follow these instructions at home:   Take over-the-counter and prescription medicines only as told by your health care provider. Always ask your health care provider about possible side effects and interactions of any new medicine that you start taking.  Keep a list of all of the medicines that you take, including over-the-counter medicines. Bring this list with you to all of your medical visits.  Drink enough fluid to keep your urine clear or pale yellow.  Keep all follow-up visits as told by your health care provider. This is important. How is this prevented?  Get help if you are struggling with: ? Alcohol or drug use. ? Depression or another mental health problem.  Keep the phone number of your local poison control center near your phone or on your cell phone.  Store all medicines in safety containers that are out of the reach of children.  Read the drug inserts that come with your medicines.  Do not drink alcohol when taking opioids.  Do not use illegal drugs.  Do not take opioid medicines that are not prescribed for you. Contact a health care provider if:  Your symptoms return.  You develop new symptoms or side effects when you are taking medicines. Get help right away if:  You think that you or someone else may have taken too much of an opioid. The hotline of the [facility] is 2081044407.  You or someone else is having symptoms of an opioid overdose.  You have serious thoughts about hurting yourself or others.  You have: ? Chest pain. ? Difficulty breathing. ? A loss of consciousness. Opioid overdose is an emergency. Do not wait to see if the symptoms will go away. Get medical help right away. Call your local emergency services (911  in the [place] Do not drive yourself to the hospital. This information is not intended to replace  advice given to you by your health care provider. Make sure you discuss any questions you have with your health care provider. Document Released: 05/30/2004 Document Revised: 11/07/2016 Document Reviewed: 10/06/2014 Elsevier Interactive Patient Education  2019 [employer].

## 2018-06-17 NOTE — BH Assessment (Signed)
Tele Assessment Note   Patient Name: Yolanda GoodpastureVicky G Adams MRN: [id number] Referring Physician: Vito BackersVicky Adams Location of Patient: [facility] Location of Provider: Behavioral Health TTS Department  Yolanda GoodpastureVicky G Christenbury is an 53 y.o. female presenting voluntarily via EMS from jail. Patient reports "taking a long piece of suboxone" prior to going to jail because she "wanted to go to sleep." Patient is currently prescribed suboxone and goes to the clinic one time weekly. Patient states she did not take it with the intention of harming herself. Patient states she has a 55 year old grand-daughter and would never hurt herself because she wants to be there for her. Patient reports she is diagnosed with depression and anxiety. She states she sees her psychiatrist on a regular basis and takes her Celexa and Klonopin as prescribed. Patient reports attending weekly group therapy at clinic. Patient denies SI/HI/AVH. Denies any prior psychiatric hospitalizations. She reports living with her father and twin brother who are supportive. Patient is out of jail on an unsecured bond and will report to court house on this date if discharged. Patient states that her probation officer is working to get her into substance use inpatient treatment at [facility]. Patient's UDS is positive for benzodiazepines (prescribed), cocaine, and amphetamines. Patient denies any history of abuse.  Patient's sister, Yolanda PoundsCindy Adams, is present for assessment and provides collateral information. She states that she knows her sister did not take prescription to intentionally overdose and does not have any concerns about her being discharged from the hospital.   Patient was alert and oriented x 4. She was dressed in scrubs and laying in bed. Her speech was logical/coherent and she made appropriate eye contact. Patient's mood and affect were depressed. Her insight, judgement, and impulse control are partial. Patient did not appear to be responding to internal  stimuli or experiencing delusional thought content.  Diagnosis: F11.20 Opioid use disorder, severe, in early remission   F33.2 MDD, recurrent. Severe   Polysubstance use  Past Medical History:  Past Medical History:  Diagnosis Date  . Anxiety and depression   . Chronic kidney disease    small rt kidney stone  . COPD (chronic obstructive pulmonary disease) (HCC)   . DDD (degenerative disc disease)   . GERD (gastroesophageal reflux disease)   . Helicobacter pylori gastritis June 2011   treated with Pylera  . HTN (hypertension)   . IBS (irritable bowel syndrome)   . Peptic stricture of esophagus    dilated twice 10/2009  . PUD (peptic ulcer disease)   . Renal insufficiency   . Seizures (HCC)    had 1 episode of it about 5 years ago, no other hx of seizures  . Shortness of breath    continuous  . Shoulder pain     Past Surgical History:  Procedure Laterality Date  . ABDOMINAL HYSTERECTOMY    . BLADDER SURGERY     x 3 in [place]-strected twice and had bladder sling placed  . CHOLECYSTECTOMY    . ESOPHAGOGASTRODUODENOSCOPY  10/26/09 GASTRITIS/DUODENITIS   Distal esol stricture/dil 15-16 mm. A 17 mm dilator would not pass  . ILEOColonoscopy  04/15/2011   SLF: Colitis in the sigmoid colon/Internal hemorrhoids  . OOPHORECTOMY     Right  . SAVORY DILATION  04/15/2011   WUJ:[id number]:Moderate gastritis/Duodenitis  . steroid shot in back    . UPPER GASTROINTESTINAL ENDOSCOPY  JUN 14, 2011DYSPHAGIA, EPIG PAIN   H PYLORI GASTRITIS    Family History:  Family History  Problem Relation  Age of Onset  . Prostate cancer Father        Partial gastrectomy  . Hypertension Father   . Stroke Father   . Colon cancer Neg Hx   . Anesthesia problems Neg Hx   . Hypotension Neg Hx   . Malignant hyperthermia Neg Hx   . Pseudochol deficiency Neg Hx     Social History:  reports that she has been smoking cigarettes. She has a 41.00 pack-year smoking history. She has never used smokeless tobacco. She  reports that she does not drink alcohol or use drugs.  Additional Social History:  Alcohol / Drug Use Pain Medications: see MAR Prescriptions: see MAR Over the Counter: see MAR Longest period of sobriety (when/how long): history of polysubstance use  CIWA: CIWA-Ar BP: (!) 122/59 Pulse Rate: 77 COWS:    Allergies:  Allergies  Allergen Reactions  . Aspirin Nausea And Vomiting and Other (See Comments)    Avoid ASA due to history ulcers (PUD)  . Flagyl [Metronidazole] Nausea Only  . Sudafed [Pseudoephedrine Hcl] Nausea Only  . Toradol [Ketorolac Tromethamine] Other (See Comments)    MIGRAINE    Home Medications:  Medications Prior to Admission  Medication Sig Dispense Refill  . Buprenorphine HCl-Naloxone HCl 8-2 MG FILM Place 2 Film under the tongue daily.     . citalopram (CELEXA) 40 MG tablet Take 40 mg by mouth daily.    . clonazePAM (KLONOPIN) 0.5 MG tablet Take 0.5 mg by mouth 4 (four) times daily.    . cyclobenzaprine (FLEXERIL) 10 MG tablet Take 10 mg by mouth 2 (two) times daily as needed for muscle spasms.     . diazepam (VALIUM) 10 MG tablet Take 10 mg by mouth 3 (three) times daily.    Marland Kitchen gabapentin (NEURONTIN) 600 MG tablet Take 600 mg by mouth 4 (four) times daily.     . hydrochlorothiazide (HYDRODIURIL) 25 MG tablet Take 25 mg by mouth daily.     . Lisdexamfetamine Dimesylate (VYVANSE) 40 MG CHEW Chew 40 mg by mouth every morning.    . naproxen (NAPROSYN) 500 MG tablet Take 500 mg by mouth 2 (two) times daily with a meal.    . risperiDONE (RISPERDAL) 1 MG tablet Take 1 mg by mouth daily.     . traZODone (DESYREL) 50 MG tablet Take 50 mg by mouth at bedtime as needed for sleep.    Marland Kitchen acetaminophen (TYLENOL EX ST ARTHRITIS PAIN) 500 MG tablet Take 500 mg by mouth every 6 (six) hours as needed for moderate pain.     Marland Kitchen albuterol (PROVENTIL HFA;VENTOLIN HFA) 108 (90 BASE) MCG/ACT inhaler Inhale 2 puffs into the lungs 4 (four) times daily.     . chlordiazePOXIDE (LIBRIUM) 25  MG capsule Take 25 mg by mouth See admin instructions. Take 50mg  twice daily for 3 days, then 25mg  twice daily for 3 days, then 25 mg nightly for 3 days, then ok.    . ibuprofen (ADVIL,MOTRIN) 200 MG tablet Take 600 mg by mouth 2 (two) times daily.     Marland Kitchen LOPERAMIDE HCL PO Take 1 tablet by mouth 2 (two) times daily.    . mometasone-formoterol (DULERA) 200-5 MCG/ACT AERO Inhale 2 puffs into the lungs daily as needed for wheezing.    . promethazine (PHENERGAN) 25 MG tablet Take 1 tablet (25 mg total) by mouth every 6 (six) hours as needed for nausea or vomiting. (Patient not taking: Reported on 06/15/2018) 8 tablet 0    OB/GYN Status:  Patient's  last menstrual period was 03/30/2011.  General Assessment Data Assessment unable to be completed: Yes Reason for not completing assessment: pt is on ICU and not medically cleared Location of Assessment: [facility] ED TTS Assessment: In system Is this a Tele or Face-to-Face Assessment?: Tele Assessment Is this an Initial Assessment or a Re-assessment for this encounter?: Initial Assessment Patient Accompanied by:: N/A Language Other than English: No Living Arrangements: Other (Comment)(father's home) What gender do you identify as?: Female Marital status: Single Maiden name: Bremer Pregnancy Status: No Living Arrangements: Parent, Other relatives Can pt return to current living arrangement?: Yes Admission Status: Voluntary Is patient capable of signing voluntary admission?: Yes Referral Source: Other(PD) Insurance type: inmate medicaid     Crisis Care Plan Living Arrangements: Parent, Other relatives  Education Status Is patient currently in school?: No Is the patient employed, unemployed or receiving disability?: Unemployed  Risk to self with the past 6 months Suicidal Ideation: No Has patient been a risk to self within the past 6 months prior to admission? : No Suicidal Intent: No Has patient had any suicidal intent within the past 6 months  prior to admission? : No Is patient at risk for suicide?: No Suicidal Plan?: No Has patient had any suicidal plan within the past 6 months prior to admission? : No Access to Means: No What has been your use of drugs/alcohol within the last 12 months?: polysubstance use Previous Attempts/Gestures: No How many times?: 0 Other Self Harm Risks: none Triggers for Past Attempts: None known Intentional Self Injurious Behavior: None Family Suicide History: No Recent stressful life event(s): Legal Issues Persecutory voices/beliefs?: No Depression: Yes Depression Symptoms: Despondent, Tearfulness, Isolating, Loss of interest in usual pleasures, Feeling worthless/self pity, Feeling angry/irritable Substance abuse history and/or treatment for substance abuse?: Yes Suicide prevention information given to non-admitted patients: Not applicable  Risk to Others within the past 6 months Homicidal Ideation: No Does patient have any lifetime risk of violence toward others beyond the six months prior to admission? : No Thoughts of Harm to Others: No Current Homicidal Intent: No Current Homicidal Plan: No Access to Homicidal Means: No Identified Victim: none History of harm to others?: No Assessment of Violence: None Noted Violent Behavior Description: none Does patient have access to weapons?: No Criminal Charges Pending?: Yes Describe Pending Criminal Charges: FTA Does patient have a court date: Yes Court Date: (UTA) Is patient on probation?: Yes  Psychosis Hallucinations: None noted Delusions: None noted  Mental Status Report Appearance/Hygiene: In scrubs Eye Contact: Good Motor Activity: Unsteady Speech: Logical/coherent Level of Consciousness: Alert Mood: Depressed Affect: Depressed Anxiety Level: None Thought Processes: Coherent, Relevant Judgement: Partial Orientation: Person, Place, Time, Situation Obsessive Compulsive Thoughts/Behaviors: None  Cognitive  Functioning Concentration: Normal Memory: Remote Intact, Recent Impaired Is patient IDD: No Insight: Fair Impulse Control: Fair Appetite: Good Have you had any weight changes? : No Change Sleep: No Change Total Hours of Sleep: 8 Vegetative Symptoms: None  ADLScreening [facility] Assessment Services) Patient's cognitive ability adequate to safely complete daily activities?: No Patient able to express need for assistance with ADLs?: No Independently performs ADLs?: Yes (appropriate for developmental age)  Prior Inpatient Therapy Prior Inpatient Therapy: No  Prior Outpatient Therapy Prior Outpatient Therapy: Yes Prior Therapy Dates: current Prior Therapy Facilty/Provider(s): [facility] Reason for Treatment: addiction Does patient have an ACCT team?: No Does patient have Intensive In-House Services?  : No Does patient have [facility] services? : No Does patient have P4CC services?: No  ADL Screening (condition  at time of admission) Patient's cognitive ability adequate to safely complete daily activities?: No Is the patient deaf or have difficulty hearing?: No Does the patient have difficulty seeing, even when wearing glasses/contacts?: No Does the patient have difficulty concentrating, remembering, or making decisions?: Yes Patient able to express need for assistance with ADLs?: No Does the patient have difficulty dressing or bathing?: Yes Independently performs ADLs?: Yes (appropriate for developmental age) Communication: Needs assistance Is this a change from baseline?: Pre-admission baseline Dressing (OT): Independent Is this a change from baseline?: Change from baseline, expected to last <3days Grooming: Dependent Is this a change from baseline?: Change from baseline, expected to last <3 days Feeding: Dependent Is this a change from baseline?: Change from baseline, expected to last <3 days Bathing: Dependent Is this a change from baseline?: Change from baseline, expected to  last <3 days Toileting: Dependent Is this a change from baseline?: Pre-admission baseline In/Out Bed: Dependent Is this a change from baseline?: Pre-admission baseline Walks in Home: Independent Does the patient have difficulty walking or climbing stairs?: Yes Weakness of Legs: Both Weakness of Arms/Hands: Both  Home Assistive Devices/Equipment Home Assistive Devices/Equipment: None  Therapy Consults (therapy consults require a physician order) PT Evaluation Needed: No OT Evalulation Needed: No SLP Evaluation Needed: No Abuse/Neglect Assessment (Assessment to be complete while patient is alone) Abuse/Neglect Assessment Can Be Completed: Yes Physical Abuse: Denies Verbal Abuse: Denies Sexual Abuse: Denies Exploitation of patient/patient's resources: Denies Values / Beliefs Cultural Requests During Hospitalization: None Spiritual Requests During Hospitalization: None Consults Spiritual Care Consult Needed: No Social Work Consult Needed: No Merchant navy officerAdvance Directives (For Healthcare) Does Patient Have a Medical Advance Directive?: No Would patient like information on creating a medical advance directive?: No - Patient declined Nutrition Screen- [facility] Adult/[facility] Patient's home diet: Regular Has the patient recently lost weight without trying?: No Has the patient been eating poorly because of a decreased appetite?: No Malnutrition Screening Tool Score: 0        Disposition: Per Magda PaganiniShuon Rankin, NP patient is psych cleared for discharge. Disposition Initial Assessment Completed for this Encounter: Yes  This service was provided via telemedicine using a 2-way, interactive audio and video technology.  Names of all persons participating in this telemedicine service and their role in this encounter. Name: Vito BackersVicky Veltre Role: patient  Name: Celedonio MiyamotoMeredith Jaion [place], [place] Role: TTS  Name:  Role:  Name:  Role:     Celedonio MiyamotoMeredith  Ed Rayson 06/17/2018 12:01 PM

## 2018-06-17 NOTE — Discharge Summary (Signed)
Physician Discharge Summary  Yolanda Adams AVW:[id number] DOB: 02/11/1966 DOA: 06/15/2018  PCP: Vertis Kelch, NP  Admit date: 06/15/2018 Discharge date: 06/17/2018  Admitted From: Home  Disposition: Home   Recommendations for Outpatient Follow-up:  1. Follow up with PCP in 1 week 2. Follow up with outpatient psychiatrist in 1 week  Discharge Condition: STABLE   CODE STATUS: FULL    Brief Hospitalization Summary: Please see all hospital notes, images, labs for full details of the hospitalization. Dr. Fredirick Lathe HPI: Yolanda Adams is a 53 y.o. female with medical history significant for COPD, seizures, anxiety and depression polysubstance abuse, peptic ulcer disease, who was brought via EMS from jail with reports of unresponsiveness.  Patient at the time of my evaluation is on BiPAP, altered, initially required pain stimulation to get a response, became awake and responded to a few questions, but subsequently went back to sleep.  History is obtained from chart review and EDP.  EMS patient tested positive for cocaine recently, was cyanotic with respiratory failure requiring bag-valve , enroute patient was given 2 mg of Narcan with mild improvement. When patient became awake she reported that she took "one long suboxone", but denied trying to intentionally hurt or kill herself.  Per nurse in ED,  [facility] reported that there were several bottles of pills in the patient's house, and patient ingested something before she was taken to the jail, where she subsequently became unresponsive.  ED Course: Respiratory rate 7-11, blood pressure systolic 91-1 30, heart rate 60s to 70s.  On BiPAP.  Head CT negative for acute intracranial abnormality.  Portable chest x-ray-no acute abnormality.  Tylenol salicylate lead alcohol level negative.  Normal lactic acid.  UA small leukocytes 11-20 bacteria.  UDS pending.  Total of 2 mg of Narcan given in ED, with improvement in colour, but not much  improvement in mental status.   Brief Admission Hx: 53 y.o.femalewith medical history significantforCOPD, seizures, anxiety and depression polysubstance abuse, peptic ulcer disease,who was brought via EMS from jail with reports of unresponsiveness.  MDM/Assessment & Plan:   1. Opioid overdose - Pt admits to taking buprenorphine just prior to being arrested. Her UDS is also positive for benzodiazepines and amphetamines.  Her mentation is improved.  Pt had a TTS evaluation and they determined that she could safely discharged with outpatient psychiatric follow-up.  I have strongly advised her to seek a follow-up with her psychiatrist as soon as possible to review her medications.  I strongly advised against using recreational drugs.  2. Acute respiratory failure - RESOLVED.  patient was treated with BiPAP and clinically has improved. 3. Polysubstance abuse- TTS consult as noted above, social work consulted and has seen patient for treatment resources.  Patient is positive for amphetamines cocaine benzos. 4. COPD- stable.  5. Essential hypertension- holding all antihypertensives and outpatient follow up with PCP.  6. Hypotension - RESOLVED.  Pt was briefly on levophed but was quickly weaned off.  7. Dehydration - treated with IVFs.  8. Hypoglycemia - RESOLVED.  Treated with dextrose in IVFs.    Her blood sugars have been stabilized and she is eating and drinking well. 9. Anxiety / depression -she was strongly advised to follow-up with her outpatient behavioral health specialist.  DVT prophylaxis: lovenox  Code Status: Full  Family Communication: updated at bedside  Disposition Plan: Home with family   Consultants:  TTS  Procedures:  bipap   Antimicrobials:  N/a    Discharge Diagnoses:  Active  Problems:   GERD   Esophageal dysphagia   Unresponsiveness   Respiratory failure (HCC)   Opiate overdose (HCC)   Hypotension   Anxiety and depression   Discharge  Instructions: Discharge Instructions    Call MD for:  difficulty breathing, headache or visual disturbances   Complete by:  As directed    Call MD for:  extreme fatigue   Complete by:  As directed    Call MD for:  persistant dizziness or light-headedness   Complete by:  As directed    Call MD for:  persistant nausea and vomiting   Complete by:  As directed    Call MD for:  severe uncontrolled pain   Complete by:  As directed    Increase activity slowly   Complete by:  As directed      Allergies as of 06/17/2018      Reactions   Aspirin Nausea And Vomiting, Other (See Comments)   Avoid ASA due to history ulcers (PUD)   Flagyl [metronidazole] Nausea Only   Sudafed [pseudoephedrine Hcl] Nausea Only   Toradol [ketorolac Tromethamine] Other (See Comments)   MIGRAINE      Medication List    STOP taking these medications   chlordiazePOXIDE 25 MG capsule Commonly known as:  LIBRIUM   clonazePAM 0.5 MG tablet Commonly known as:  KLONOPIN   cyclobenzaprine 10 MG tablet Commonly known as:  FLEXERIL   DULERA 200-5 MCG/ACT Aero Generic drug:  mometasone-formoterol   gabapentin 600 MG tablet Commonly known as:  NEURONTIN   hydrochlorothiazide 25 MG tablet Commonly known as:  HYDRODIURIL   ibuprofen 200 MG tablet Commonly known as:  ADVIL,MOTRIN   LOPERAMIDE HCL PO   naproxen 500 MG tablet Commonly known as:  NAPROSYN   promethazine 25 MG tablet Commonly known as:  PHENERGAN   VYVANSE 40 MG Chew Generic drug:  Lisdexamfetamine Dimesylate     TAKE these medications   albuterol 108 (90 Base) MCG/ACT inhaler Commonly known as:  PROVENTIL HFA;VENTOLIN HFA Inhale 2 puffs into the lungs 4 (four) times daily.   Buprenorphine HCl-Naloxone HCl 8-2 MG Film Place 2 Film under the tongue daily.   citalopram 40 MG tablet Commonly known as:  CELEXA Take 40 mg by mouth daily.   diazepam 10 MG tablet Commonly known as:  VALIUM Take 0.5 tablets (5 mg total) by mouth 3  (three) times daily. What changed:  how much to take   risperiDONE 1 MG tablet Commonly known as:  RISPERDAL Take 1 mg by mouth daily.   traZODone 50 MG tablet Commonly known as:  DESYREL Take 50 mg by mouth at bedtime as needed for sleep.   TYLENOL EX ST ARTHRITIS PAIN 500 MG tablet Generic drug:  acetaminophen Take 500 mg by mouth every 6 (six) hours as needed for moderate pain.      Follow-up Information    Vertis Kelch, NP. Schedule an appointment as soon as possible for a visit in 1 week(s).   Specialty:  Nurse Practitioner Contact information: [address] [place] [address] [phone]        psychiatric provider Follow up in 2 day(s).          Allergies  Allergen Reactions  . Aspirin Nausea And Vomiting and Other (See Comments)    Avoid ASA due to history ulcers (PUD)  . Flagyl [Metronidazole] Nausea Only  . Sudafed [Pseudoephedrine Hcl] Nausea Only  . Toradol [Ketorolac Tromethamine] Other (See Comments)    MIGRAINE  Allergies as of 06/17/2018      Reactions   Aspirin Nausea And Vomiting, Other (See Comments)   Avoid ASA due to history ulcers (PUD)   Flagyl [metronidazole] Nausea Only   Sudafed [pseudoephedrine Hcl] Nausea Only   Toradol [ketorolac Tromethamine] Other (See Comments)   MIGRAINE      Medication List    STOP taking these medications   chlordiazePOXIDE 25 MG capsule Commonly known as:  LIBRIUM   clonazePAM 0.5 MG tablet Commonly known as:  KLONOPIN   cyclobenzaprine 10 MG tablet Commonly known as:  FLEXERIL   DULERA 200-5 MCG/ACT Aero Generic drug:  mometasone-formoterol   gabapentin 600 MG tablet Commonly known as:  NEURONTIN   hydrochlorothiazide 25 MG tablet Commonly known as:  HYDRODIURIL   ibuprofen 200 MG tablet Commonly known as:  ADVIL,MOTRIN   LOPERAMIDE HCL PO   naproxen 500 MG tablet Commonly known as:  NAPROSYN   promethazine 25 MG tablet Commonly known as:  PHENERGAN   VYVANSE 40 MG Chew Generic  drug:  Lisdexamfetamine Dimesylate     TAKE these medications   albuterol 108 (90 Base) MCG/ACT inhaler Commonly known as:  PROVENTIL HFA;VENTOLIN HFA Inhale 2 puffs into the lungs 4 (four) times daily.   Buprenorphine HCl-Naloxone HCl 8-2 MG Film Place 2 Film under the tongue daily.   citalopram 40 MG tablet Commonly known as:  CELEXA Take 40 mg by mouth daily.   diazepam 10 MG tablet Commonly known as:  VALIUM Take 0.5 tablets (5 mg total) by mouth 3 (three) times daily. What changed:  how much to take   risperiDONE 1 MG tablet Commonly known as:  RISPERDAL Take 1 mg by mouth daily.   traZODone 50 MG tablet Commonly known as:  DESYREL Take 50 mg by mouth at bedtime as needed for sleep.   TYLENOL EX ST ARTHRITIS PAIN 500 MG tablet Generic drug:  acetaminophen Take 500 mg by mouth every 6 (six) hours as needed for moderate pain.       Procedures/Studies: Ct Head Wo Contrast  Result Date: 06/15/2018 CLINICAL DATA:  Unresponsive in jail. EXAM: CT HEAD WITHOUT CONTRAST TECHNIQUE: Contiguous axial images were obtained from the base of the skull through the vertex without intravenous contrast. COMPARISON:  11/27/2014. FINDINGS: Brain: No evidence for acute infarction, hemorrhage, mass lesion, hydrocephalus, or extra-axial fluid. Normal for age cerebral volume. No significant white matter disease. Vascular: Calcification of the cavernous internal carotid arteries consistent with cerebrovascular atherosclerotic disease. No signs of intracranial large vessel occlusion. Skull: Intact. Sinuses/Orbits: BILATERAL ethmoid sinus disease. Negative orbits. Other: None. IMPRESSION: Negative exam. No acute or focal intracranial abnormality. Similar appearance to priors. Electronically Signed   By: Elsie Stain M.D.   On: 06/15/2018 16:46   Dg Chest Port 1 View  Result Date: 06/15/2018 CLINICAL DATA:  Respiratory failure EXAM: PORTABLE CHEST 1 VIEW COMPARISON:  05/22/2018 FINDINGS: Interval  borderline to mild cardiomegaly, likely augmented by portable technique. No pleural effusion. No edema. No focal airspace disease or pneumothorax IMPRESSION: Interval borderline to mild cardiomegaly partially augmented by portable technique. Negative for edema or focal pulmonary infiltrate. Electronically Signed   By: Jasmine Pang M.D.   On: 06/15/2018 15:30   Dg Abd Acute W/chest  Result Date: 05/22/2018 CLINICAL DATA:  Nausea and vomiting with coffee-ground emesis. Undergoing detoxification EXAM: DG ABDOMEN ACUTE W/ 1V CHEST COMPARISON:  12/12/2014 chest x-ray FINDINGS: Normal bowel gas pattern. No abnormal stool retention. Cholecystectomy clips. No concerning mass effect or gas  collection. Interstitial coarsening of the lungs correlating with COPD history. There is no edema, consolidation, effusion, or pneumothorax. Normal heart size and mediastinal contours. Remote right eighth rib fracture. IMPRESSION: Negative abdominal radiographs.  No acute cardiopulmonary disease. Electronically Signed   By: Marnee Spring M.D.   On: 05/22/2018 08:23      Subjective: Patient says she is feeling much better today.  She been drinking well.  She has had 2 bowel movements since she was admitted and urinating well without problems.   Discharge Exam: Vitals:   06/17/18 0807 06/17/18 1000  BP:    Pulse:  77  Resp:  20  Temp: 97.9 F (36.6 C)   SpO2:  97%   Vitals:   06/17/18 0700 06/17/18 0800 06/17/18 0807 06/17/18 1000  BP: 111/63 (!) 122/59    Pulse: (!) 48 (!) 55  77  Resp: 19 16  20   Temp:   97.9 F (36.6 C)   TempSrc:   Oral   SpO2: 98% 95%  97%  Weight:      Height:        General: Pt is alert, awake, not in acute distress Cardiovascular: RRR, S1/S2 +, no rubs, no gallops Respiratory: CTA bilaterally, no wheezing, no rhonchi Abdominal: Soft, NT, ND, bowel sounds + Extremities: no edema, no cyanosis   The results of significant diagnostics from this hospitalization (including  imaging, microbiology, ancillary and laboratory) are listed below for reference.     Microbiology: Recent Results (from the past 240 hour(s))  MRSA PCR Screening     Status: None   Collection Time: 06/16/18 12:48 AM  Result Value Ref Range Status   MRSA by PCR NEGATIVE NEGATIVE Final    Comment:        The GeneXpert MRSA Assay (FDA approved for NASAL specimens only), is one component of a comprehensive MRSA colonization surveillance program. It is not intended to diagnose MRSA infection nor to guide or monitor treatment for MRSA infections. Performed at [facility], [address] [place], [place] [address]      Labs: BNP (last 3 results) No results for input(s): BNP in the last 8760 hours. Basic Metabolic Panel: Recent Labs  Lab 06/15/18 1543 06/16/18 0539  NA 136 141  K 3.9 3.8  CL 99 108  CO2 26 27  GLUCOSE 91 122*  BUN 9 7  CREATININE 0.75 0.69  CALCIUM 9.0 8.2*   Liver Function Tests: Recent Labs  Lab 06/15/18 1543  AST 18  ALT 17  ALKPHOS 73  BILITOT 0.6  PROT 7.5  ALBUMIN 4.1   No results for input(s): LIPASE, AMYLASE in the last 168 hours. No results for input(s): AMMONIA in the last 168 hours. CBC: Recent Labs  Lab 06/15/18 1543 06/16/18 0539  WBC 8.0 7.5  HGB 12.9 11.0*  HCT 39.8 36.2  MCV 99.7 103.7*  PLT 256 201   Cardiac Enzymes: Recent Labs  Lab 06/15/18 1543  TROPONINI <0.03   BNP: Invalid input(s): POCBNP CBG: Recent Labs  Lab 06/16/18 1557 06/16/18 2131 06/17/18 0016 06/17/18 0400 06/17/18 0803  GLUCAP 96 100* 94 89 89   D-Dimer No results for input(s): DDIMER in the last 72 hours. Hgb A1c No results for input(s): HGBA1C in the last 72 hours. Lipid Profile No results for input(s): CHOL, HDL, LDLCALC, TRIG, CHOLHDL, LDLDIRECT in the last 72 hours. Thyroid function studies No results for input(s): TSH, T4TOTAL, T3FREE, THYROIDAB in the last 72 hours.  Invalid input(s): FREET3 Anemia work  up No results for  input(s): VITAMINB12, FOLATE, FERRITIN, TIBC, IRON, RETICCTPCT in the last 72 hours. Urinalysis    Component Value Date/Time   COLORURINE YELLOW 06/15/2018 1510   APPEARANCEUR HAZY (A) 06/15/2018 1510   LABSPEC 1.008 06/15/2018 1510   PHURINE 5.0 06/15/2018 1510   GLUCOSEU NEGATIVE 06/15/2018 1510   HGBUR NEGATIVE 06/15/2018 1510   BILIRUBINUR NEGATIVE 06/15/2018 1510   KETONESUR NEGATIVE 06/15/2018 1510   PROTEINUR NEGATIVE 06/15/2018 1510   UROBILINOGEN 0.2 09/22/2014 1045   NITRITE NEGATIVE 06/15/2018 1510   LEUKOCYTESUR SMALL (A) 06/15/2018 1510   Sepsis Labs Invalid input(s): PROCALCITONIN,  WBC,  LACTICIDVEN Microbiology Recent Results (from the past 240 hour(s))  MRSA PCR Screening     Status: None   Collection Time: 06/16/18 12:48 AM  Result Value Ref Range Status   MRSA by PCR NEGATIVE NEGATIVE Final    Comment:        The GeneXpert MRSA Assay (FDA approved for NASAL specimens only), is one component of a comprehensive MRSA colonization surveillance program. It is not intended to diagnose MRSA infection nor to guide or monitor treatment for MRSA infections. Performed at [facility], [address] [place], [place] [id number]    Time coordinating discharge: 33 minutes   SIGNED:  Standley Dakinslanford Domanik [place], MD  [facility] 06/17/2018, 12:12 PM How to contact the [facility] Attending or Consulting provider 7A - 7P or covering provider during after hours 7P -7A, for this patient?  1. Check the care team in [facility] and look for a) attending/consulting [facility] provider listed and b) the [facility] team listed 2. Log into [email or web address] and use [facility]'s universal password to access. If you do not have the password, please contact the hospital operator. 3. Locate the [facility] provider you are looking for under [facility] and page to a number that you can be directly reached. 4. If you still have difficulty reaching the provider, please page the [facility] (Director on Call) for the  Hospitalists listed on amion for assistance.

## 2018-06-17 NOTE — Evaluation (Signed)
Physical Therapy Evaluation Patient Details Name: Yolanda Adams MRN: [id number] DOB: 21-Nov-1965 Today's Date: 06/17/2018   History of Present Illness  Yolanda Adams is a 53 y.o. female with medical history significant for COPD, seizures, anxiety and depression polysubstance abuse, peptic ulcer disease, who was brought via EMS from jail with reports of unresponsiveness.  Patient at the time of my evaluation is on BiPAP, altered, initially required pain stimulation to get a response, became awake and responded to a few questions, but subsequently went back to sleep.  History is obtained from chart review and EDP.  EMS patient tested positive for cocaine recently, was cyanotic with respiratory failure requiring bag-valve , enroute patient was given 2 mg of Narcan with mild improvement.    Clinical Impression  Pt presents supine in bed and agreeable to PT eval. Pt able to perform bed mobility, transfers, and ambulate around room and hallway without AD demonstrating slower movement with mobility, but without loss of balance and no shortness of breath noted. Pt reports "this is my normal speed" when asked. Pt's parole officer in room during treatment session and remains after evaluation complete. Physical therapy evaluation completed, patient is at baseline and no further PT services recommended at this time. Patient discharged to care of nursing for ambulation daily as tolerated for length of stay.     Follow Up Recommendations No PT follow up    Equipment Recommendations  None recommended by PT    Recommendations for Other Services       Precautions / Restrictions        Mobility  Bed Mobility Overal bed mobility: Modified Independent             General bed mobility comments: increased time  Transfers Overall transfer level: Modified independent Equipment used: None             General transfer comment: increased time  Ambulation/Gait Ambulation/Gait assistance:  Modified independent (Device/Increase time) Gait Distance (Feet): 200 Feet Assistive device: None Gait Pattern/deviations: WFL(Within Functional Limits);Decreased stride length Gait velocity: slightly decreased   General Gait Details: Pt with heel-toe pattern, increased lateral weight-shifting, no loss of balance, no shortness of breath noted  Stairs            Wheelchair Mobility    Modified Rankin (Stroke Patients Only)       Balance Overall balance assessment: No apparent balance deficits (not formally assessed)                                           Pertinent Vitals/Pain Pain Assessment: No/denies pain    Home Living Family/patient expects to be discharged to:: Other (Comment)(Jail vs home) Living Arrangements: Parent;Other relatives(Pt reports lives with brother and father) Available Help at Discharge: Family Type of Home: House Home Access: Stairs to enter Entrance Stairs-Rails: Right Entrance Stairs-Number of Steps: 3-5 Home Layout: One level Home Equipment: Cane - single point      Prior Function Level of Independence: Independent         Comments: Pt reports she is not driving or working, ambulates community distances without AD, and 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     Hand Dominance        Extremity/Trunk Assessment   Upper Extremity Assessment Upper Extremity Assessment: Overall WFL for tasks assessed    Lower Extremity Assessment Lower Extremity Assessment: Overall WFL for  tasks assessed    Cervical / Trunk Assessment Cervical / Trunk Assessment: Normal  Communication   Communication: No difficulties  Cognition Arousal/Alertness: Awake/alert Behavior During Therapy: WFL for tasks assessed/performed Overall Cognitive Status: Within Functional Limits for tasks assessed                                        General Comments      Exercises     Assessment/Plan    PT Assessment Patent does  not need any further PT services  PT Problem List         PT Treatment Interventions      PT Goals (Current goals can be found in the Care Plan section)  Acute Rehab PT Goals Patient Stated Goal: return home PT Goal Formulation: With patient Time For Goal Achievement: 06/17/18 Potential to Achieve Goals: Good    Frequency     Barriers to discharge        Co-evaluation               AM-PAC PT "6 Clicks" Mobility  Outcome Measure Help needed turning from your back to your side while in a flat bed without using bedrails?: None Help needed moving from lying on your back to sitting on the side of a flat bed without using bedrails?: None Help needed moving to and from a bed to a chair (including a wheelchair)?: None Help needed standing up from a chair using your arms (e.g., wheelchair or bedside chair)?: None Help needed to walk in hospital room?: None Help needed climbing 3-5 steps with a railing? : None 6 Click Score: 24    End of Session Equipment Utilized During Treatment: Gait belt Activity Tolerance: Patient tolerated treatment well Patient left: in bed;with call bell/phone within reach;with family/visitor present(Parole officer in room) Nurse Communication: Mobility status      Time: 5825-1898 PT Time Calculation (min) (ACUTE ONLY): 30 min   Charges:   PT Evaluation $PT Eval Moderate Complexity: 1 Mod PT Treatments $Gait Training: 8-22 mins $Therapeutic Activity: 8-22 mins       9:55 AM, 06/17/18 Domenick Bookbinder, DPT Physical Therapist with [facility] [phone] office

## 2018-06-17 NOTE — Progress Notes (Signed)
Patient given discharge paperwork and verbalized understanding. 2 peripheral IVs removed. Patient to be discharged home.

## 2019-12-15 ENCOUNTER — Ambulatory Visit: Admitting: Urology

## 2020-01-06 ENCOUNTER — Other Ambulatory Visit: Payer: Self-pay

## 2020-01-06 ENCOUNTER — Ambulatory Visit (INDEPENDENT_AMBULATORY_CARE_PROVIDER_SITE_OTHER): Payer: Medicaid Other | Admitting: Gastroenterology

## 2020-01-06 ENCOUNTER — Encounter (INDEPENDENT_AMBULATORY_CARE_PROVIDER_SITE_OTHER): Payer: Self-pay | Admitting: Gastroenterology

## 2020-01-06 VITALS — BP 111/77 | HR 86 | Temp 97.9°F | Ht 63.0 in | Wt 186.8 lb

## 2020-01-06 DIAGNOSIS — R1314 Dysphagia, pharyngoesophageal phase: Secondary | ICD-10-CM

## 2020-01-06 DIAGNOSIS — Z01810 Encounter for preprocedural cardiovascular examination: Secondary | ICD-10-CM | POA: Diagnosis not present

## 2020-01-06 DIAGNOSIS — K219 Gastro-esophageal reflux disease without esophagitis: Secondary | ICD-10-CM | POA: Diagnosis not present

## 2020-01-06 MED ORDER — OMEPRAZOLE 40 MG PO CPDR: 40.0000 mg | DELAYED_RELEASE_CAPSULE | Freq: Every day | ORAL | 3 refills | Status: AC

## 2020-01-06 NOTE — Patient Instructions (Signed)
Start omeprazole 40 mg qday Stop Nexium Schedule modified barium swallow

## 2020-01-06 NOTE — Progress Notes (Signed)
Yolanda Adams, M.D. Gastroenterology & Hepatology [facility] [address] [place], [place] [address] Primary Care Physician: Vertis Kelch, NP [address] [place] [address]  Referring MD: PCP  I will communicate my assessment and recommendations to the referring MD via EMR. Note: Occasional unusual wording and randomly placed punctuation marks may result from the use of speech recognition technology to transcribe this document"  Chief Complaint:  Dysphagia  History of Present Illness: Yolanda Adams is a 54 y.o. female with medical history significant for COPD, seizures, anxiety and depression, history of narcotic use, peptic ulcer disease, IBS-D, who presents for evaluation of dysphagia.  The patient reports that for the last 6 months she has felt intermittent episodes of dysphagia to solid food but not to liquids.  She has not noticed any progression of her symptoms but they have been frequent.  She states that she feels "the food sits in the lower part of her throat" intermittently.  Sometimes she has to force herself to speak the food contents at the do not go down after waiting for a while.  Also endorses occasional odynophagia but no hematemesis.  She also endorses having a longstanding history of heartburn for least the last 10 years it happens on a daily basis but not while sleeping.  She takes Nexium 20 mg every day for GERD and waits close to an hour to have any breakfast.  Has presented some intermittent episodes of nausea not related to any food, but has not been vomiting.  The patient denies having any fever, chills, hematochezia, melena, hematemesis, abdominal distention, abdominal pain, diarrhea, jaundice, pruritus or weight loss.  Patient had previous neg TTG IgA.  Last EGD: 2012  - duodenitis, biopsies neg for celiac disease. No strictures observed but empiric dilation performed.  Biopsies negative for H. pylori,  mucosa only showed chronic inactive gastritis.  Small bowel biopsies did not show any alterations.  Last Colonoscopy: 2012 - mild colitis In sigmoid colon which was biopsied but was only positive for hyperemia but no inflammation. Internal hemorrhoids.  Random colonic biopsies were negative for microscopic colitis.  Rectal polyp was consistent with hyperplastic polyp.  FHx: neg for any gastrointestinal/liver disease, no malignancies Social: Current smoker of 1 pack/day which he has been doing for multiple years, denies alcohol.  She denies using any illicit drugs on a very frequent basis but she used to take narcotics chronically (used to take oxycodone on a daily basis but stopped 2 years ago) Surgical: non contributory  Past Medical History: Past Medical History:  Diagnosis Date  . Anxiety and depression   . Chronic kidney disease    small rt kidney stone  . COPD (chronic obstructive pulmonary disease) (HCC)   . DDD (degenerative disc disease)   . GERD (gastroesophageal reflux disease)   . Helicobacter pylori gastritis June 2011   treated with Pylera  . HTN (hypertension)   . IBS (irritable bowel syndrome)   . Peptic stricture of esophagus    dilated twice 10/2009  . PUD (peptic ulcer disease)   . Renal insufficiency   . Seizures (HCC)    had 1 episode of it about 5 years ago, no other hx of seizures  . Shortness of breath    continuous  . Shoulder pain     Past Surgical History: Past Surgical History:  Procedure Laterality Date  . ABDOMINAL HYSTERECTOMY    . BLADDER SURGERY     x 3 in  [place]-strected twice and had bladder sling placed  . CHOLECYSTECTOMY    . ESOPHAGOGASTRODUODENOSCOPY  10/26/09 GASTRITIS/DUODENITIS   Distal esol stricture/dil 15-16 mm. A 17 mm dilator would not pass  . ILEOColonoscopy  04/15/2011   SLF: Colitis in the sigmoid colon/Internal hemorrhoids  . OOPHORECTOMY     Right  . SAVORY DILATION  04/15/2011   QIO:[id number] gastritis/Duodenitis  . steroid  shot in back    . UPPER GASTROINTESTINAL ENDOSCOPY  JUN 14, 2011DYSPHAGIA, EPIG PAIN   H PYLORI GASTRITIS    Family History: Family History  Problem Relation Age of Onset  . Prostate cancer Father        Partial gastrectomy  . Hypertension Father   . Stroke Father   . Colon cancer Neg Hx   . Anesthesia problems Neg Hx   . Hypotension Neg Hx   . Malignant hyperthermia Neg Hx   . Pseudochol deficiency Neg Hx     Social History: Social History   Tobacco Use  Smoking Status Current Every Day Smoker  . Packs/day: 1.00  . Years: 41.00  . Pack years: 41.00  . 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 Aspirin Nausea And Vomiting and Other (See Comments)    Avoid ASA due to history ulcers (PUD)  . Flagyl [Metronidazole] Nausea Only  . Sudafed [Pseudoephedrine Hcl] Nausea Only  . Toradol [Ketorolac Tromethamine] Other (See Comments)    MIGRAINE    Medications: Current Outpatient Medications  Medication Sig Dispense Refill  . acetaminophen (TYLENOL EX ST ARTHRITIS PAIN) 500 MG tablet Take 500 mg by mouth every 6 (six) hours as needed for moderate pain.     Marland Kitchen albuterol (PROVENTIL HFA;VENTOLIN HFA) 108 (90 BASE) MCG/ACT inhaler Inhale 2 puffs into the lungs 4 (four) times daily.     . Buprenorphine HCl-Naloxone HCl 8-2 MG FILM Place 2 Film under the tongue daily.     . citalopram (CELEXA) 40 MG tablet Take 40 mg by mouth daily.    . diazepam (VALIUM) 10 MG tablet Take 0.5 tablets (5 mg total) by mouth 3 (three) times daily. 30 tablet 0  . risperiDONE (RISPERDAL) 1 MG tablet Take 1 mg by mouth daily.     . traZODone (DESYREL) 50 MG tablet Take 50 mg by mouth at bedtime as needed for sleep.     No current facility-administered medications for this visit.    Review of  Systems: GENERAL: negative for malaise, night sweats HEENT: No changes in hearing or vision, no nose bleeds or other nasal problems. NECK: Negative for lumps, goiter, pain and significant neck swelling RESPIRATORY: Negative for cough, wheezing CARDIOVASCULAR: Negative for chest pain, leg swelling, palpitations, orthopnea GI: SEE HPI MUSCULOSKELETAL: Negative for joint pain or swelling, back pain, and muscle pain. SKIN: Negative for lesions, rash PSYCH: Negative for sleep disturbance, mood disorder and recent psychosocial stressors. HEMATOLOGY Negative for prolonged bleeding, bruising easily, and swollen nodes. ENDOCRINE: Negative for cold or heat intolerance, polyuria, polydipsia and goiter. NEURO: negative for tremor, gait imbalance, syncope and seizures. The remainder of the review of systems is noncontributory.   Physical Exam: BP 111/77 (BP Location: Right Arm, Patient Position: Sitting, Cuff Size: Normal)   Pulse 86   Temp 97.9 F (36.6 C) (Oral)  Ht 5\' 3"  (1.6 m)   Wt 186 lb 12.8 oz (84.7 kg)   LMP 03/30/2011   BMI 33.09 kg/m  GENERAL: The patient is AO x3, in no acute distress.  Obese. HEENT: Head is normocephalic and atraumatic. EOMI are intact. Mouth is well hydrated and without lesions. NECK: Supple. No masses LUNGS: Clear to auscultation. No presence of rhonchi/wheezing/rales. Adequate chest expansion HEART: RRR, normal s1 and s2. ABDOMEN: Soft, nontender, no guarding, no peritoneal signs, and nondistended. BS +. No masses. EXTREMITIES: Without any cyanosis, clubbing, rash, lesions or edema. NEUROLOGIC: AOx3, no focal motor deficit. SKIN: no jaundice, no rashes   Imaging/Labs: as above  I personally reviewed and interpreted the available labs, imaging and endoscopic files.  Impression and Plan: MEGHNA HAGMANN is a 54 y.o. female with medical history significant for COPD, seizures, anxiety and depression, history of narcotic use, peptic ulcer disease, IBS-D, who  presents for evaluation of dysphagia.  The patient has presented recurrent episodes of dysphagia without presence of red flag signs.  She reports that she had relief of her symptoms back in 2012 when she had an empiric dilation performed, at this time no stricture was observed but she had improvement of her symptomatology.  I explained to her that part of her symptoms could be related to sequela of narcotics in her nervous system leading to esophageal hypersensitivity, but at this point we will proceed with an EGD with possible dilation.  Since she is presenting persistent heartburn, her dose of PPI will be increased to 40 mg every day and she can continue taking it in the morning fasting.  Finally, a modified barium swallow will be ordered to evaluate for oropharyngeal alterations leading to dysphagia.  The patient understood and agreed.  - Schedule EGD with possible ED - Start omeprazole 40 mg qday - Stop Nexium - Schedule modified barium swallow  All questions were answered.      2013, MD Gastroenterology and Hepatology [facility]

## 2020-01-12 ENCOUNTER — Other Ambulatory Visit (INDEPENDENT_AMBULATORY_CARE_PROVIDER_SITE_OTHER): Payer: Self-pay | Admitting: *Deleted

## 2020-01-13 ENCOUNTER — Other Ambulatory Visit (HOSPITAL_COMMUNITY): Payer: Self-pay | Admitting: Specialist

## 2020-01-13 DIAGNOSIS — R1312 Dysphagia, oropharyngeal phase: Secondary | ICD-10-CM

## 2020-01-13 DIAGNOSIS — K219 Gastro-esophageal reflux disease without esophagitis: Secondary | ICD-10-CM

## 2020-01-17 ENCOUNTER — Telehealth (INDEPENDENT_AMBULATORY_CARE_PROVIDER_SITE_OTHER): Payer: Self-pay | Admitting: Gastroenterology

## 2020-01-18 ENCOUNTER — Other Ambulatory Visit: Payer: Self-pay

## 2020-01-18 ENCOUNTER — Ambulatory Visit (HOSPITAL_COMMUNITY): Admitting: Speech Pathology

## 2020-01-18 ENCOUNTER — Other Ambulatory Visit (HOSPITAL_COMMUNITY): Payer: Self-pay | Admitting: Gastroenterology

## 2020-01-18 ENCOUNTER — Ambulatory Visit (HOSPITAL_COMMUNITY)
Admission: RE | Admit: 2020-01-18 | Discharge: 2020-01-18 | Disposition: A | Discharge status: Home / Self Care | Payer: Medicaid Other | Source: Ambulatory Visit | Attending: Gastroenterology | Admitting: Gastroenterology

## 2020-01-18 ENCOUNTER — Encounter (HOSPITAL_COMMUNITY): Payer: Self-pay

## 2020-01-18 DIAGNOSIS — K219 Gastro-esophageal reflux disease without esophagitis: Secondary | ICD-10-CM

## 2020-01-18 DIAGNOSIS — R1312 Dysphagia, oropharyngeal phase: Secondary | ICD-10-CM

## 2020-01-19 ENCOUNTER — Encounter (INDEPENDENT_AMBULATORY_CARE_PROVIDER_SITE_OTHER): Payer: Self-pay | Admitting: Gastroenterology

## 2020-01-19 NOTE — Telephone Encounter (Signed)
Hi Mitzie,  Can you please add the following information to the letter outline and I will sign it?  To Whom it may concern,  This letter verifies that Mrs. Bernardy is scheduled to undergo an endoscopic procedure on 9/21 at [facility] for evaluation of a medical condition.  If you have any questions please do not hesitate to reach me back.  Thanks,  Katrinka Blazing, MD Gastroenterology and Hepatology [facility]

## 2020-01-19 NOTE — Telephone Encounter (Signed)
Patient called stated she needs a note for court stating she has a procedure scheduled for 9/21  -  please advise - ph# [phone]

## 2020-01-24 ENCOUNTER — Other Ambulatory Visit (HOSPITAL_COMMUNITY)
Admission: RE | Admit: 2020-01-24 | Discharge: 2020-01-24 | Disposition: A | Discharge status: Home / Self Care | Payer: Medicaid Other | Source: Ambulatory Visit | Attending: Gastroenterology | Admitting: Gastroenterology

## 2020-01-24 ENCOUNTER — Encounter (HOSPITAL_COMMUNITY): Payer: Self-pay | Admitting: Speech Pathology

## 2020-01-24 ENCOUNTER — Ambulatory Visit (HOSPITAL_COMMUNITY): Payer: Medicaid Other | Attending: Gastroenterology | Admitting: Speech Pathology

## 2020-01-24 ENCOUNTER — Other Ambulatory Visit: Payer: Self-pay

## 2020-01-24 ENCOUNTER — Ambulatory Visit (HOSPITAL_COMMUNITY)
Admission: RE | Admit: 2020-01-24 | Discharge: 2020-01-24 | Disposition: A | Discharge status: Home / Self Care | Payer: Medicaid Other | Source: Ambulatory Visit | Attending: Gastroenterology | Admitting: Gastroenterology

## 2020-01-24 DIAGNOSIS — R1312 Dysphagia, oropharyngeal phase: Secondary | ICD-10-CM | POA: Insufficient documentation

## 2020-01-24 DIAGNOSIS — Z01812 Encounter for preprocedural laboratory examination: Secondary | ICD-10-CM | POA: Insufficient documentation

## 2020-01-24 DIAGNOSIS — K219 Gastro-esophageal reflux disease without esophagitis: Secondary | ICD-10-CM | POA: Diagnosis present

## 2020-01-24 DIAGNOSIS — Z20822 Contact with and (suspected) exposure to covid-19: Secondary | ICD-10-CM | POA: Insufficient documentation

## 2020-01-24 LAB — BASIC METABOLIC PANEL
Anion gap: 9 (ref 5–15)
BUN: 8 mg/dL (ref 6–20)
CO2: 28 mmol/L (ref 22–32)
Calcium: 8.9 mg/dL (ref 8.9–10.3)
Chloride: 99 mmol/L (ref 98–111)
Creatinine, Ser: 0.75 mg/dL (ref 0.44–1.00)
GFR calc Af Amer: >60 mL/min (ref >60)
GFR calc non Af Amer: >60 mL/min (ref >60)
Glucose, Bld: 142 mg/dL — ABNORMAL HIGH (ref 70–99)
Potassium: 3.6 mmol/L (ref 3.5–5.1)
Sodium: 136 mmol/L (ref 135–145)

## 2020-01-24 LAB — SARS CORONAVIRUS 2 (TAT 6-24 HRS): SARS Coronavirus 2: NEGATIVE

## 2020-01-24 NOTE — Therapy (Signed)
[facility] [address] [place], [place], [address] Phone: [phone]   Fax:  [phone]  Modified Barium Swallow  Patient Details  Name: Yolanda Adams MRN: [id number] Date of Birth: 02-05-1966 No data recorded  Encounter Date: 01/24/2020   End of Session - 01/24/20 1543    Visit Number 1    Number of Visits 1    Authorization Type Generic Inmates    SLP Start Time 1136    SLP Stop Time  1202    SLP Time Calculation (min) 26 min    Activity Tolerance Patient tolerated treatment well           Past Medical History:  Diagnosis Date  . Anxiety and depression   . Chronic kidney disease    small rt kidney stone  . COPD (chronic obstructive pulmonary disease) (HCC)   . DDD (degenerative disc disease)   . GERD (gastroesophageal reflux disease)   . Helicobacter pylori gastritis June 2011   treated with Pylera  . HTN (hypertension)   . IBS (irritable bowel syndrome)   . Peptic stricture of esophagus    dilated twice 10/2009  . PUD (peptic ulcer disease)   . Renal insufficiency   . Seizures (HCC)    had 1 episode of it about 5 years ago, no other hx of seizures  . Shortness of breath    continuous  . Shoulder pain     Past Surgical History:  Procedure Laterality Date  . ABDOMINAL HYSTERECTOMY    . BLADDER SURGERY     x 3 in [place]-strected twice and had bladder sling placed  . CHOLECYSTECTOMY    . ESOPHAGOGASTRODUODENOSCOPY  10/26/09 GASTRITIS/DUODENITIS   Distal esol stricture/dil 15-16 mm. A 17 mm dilator would not pass  . ILEOColonoscopy  04/15/2011   SLF: Colitis in the sigmoid colon/Internal hemorrhoids  . OOPHORECTOMY     Right  . SAVORY DILATION  04/15/2011   WEX:[id number] gastritis/Duodenitis  . steroid shot in back    . UPPER GASTROINTESTINAL ENDOSCOPY  JUN 14, 2011DYSPHAGIA, EPIG PAIN   H PYLORI GASTRITIS    There were no vitals filed for this visit.   Subjective Assessment - 01/24/20 1420    Subjective  "I have a hard time swallowing steak and have to wash it down with liquids."    Special Tests MBSS    Currently in Pain? No/denies               General - 01/24/20 1421      General Information   Date of Onset 01/06/20    HPI Yolanda Adams is a 54 y.o. female with medical history significant for COPD, seizures, anxiety and depression, history of narcotic use, peptic ulcer disease, IBS-D, who presents for evaluation of dysphagia after referral from Dr. Marguerita Merles for MBSS. The patient has presented recurrent episodes of dysphagia without presence of red flag signs.  She reports that she had relief of her symptoms back in 2012 when she had an empiric dilation performed, at this time no stricture was observed but she had improvement of her symptomatology.  Part of her symptoms could be related to sequela of narcotics in her nervous system leading to esophageal hypersensitivity, but at this point we will proceed with an EGD with possible dilation.  Since she is presenting persistent heartburn, her dose of PPI will be increased to 40 mg every day and she can continue taking it in the morning fasting.The patient reports  that for the last 6 months she has felt intermittent episodes of dysphagia to solid food but not to liquids.  She has not noticed any progression of her symptoms but they have been frequent.  She states that she feels "the food sits in the lower part of her throat" intermittently.  She takes Nexium 20 mg every day for GERD and waits close to an hour to have any breakfast. Last EGD: 2012  - duodenitis, biopsies neg for celiac disease. No strictures observed but empiric dilation performed.  Biopsies negative for H. pylori, mucosa only showed chronic inactive gastritis. Current smoker of 1 pack/day which he has been doing for multiple years, denies alcohol.  She denies using any illicit drugs on a very frequent basis but she used to take narcotics chronically (used to take oxycodone on  a daily basis but stopped 2 years ago).    Type of Study MBS-Modified Barium Swallow Study    Previous Swallow Assessment None on record    Diet Prior to this Study Regular;Thin liquids    Temperature Spikes Noted No    Respiratory Status Room air    History of Recent Intubation No    Behavior/Cognition Alert;Cooperative;Pleasant mood    Oral Cavity Assessment Within Functional Limits    Oral Care Completed by SLP No    Oral Cavity - Dentition Missing dentition    Vision Functional for self feeding    Self-Feeding Abilities Able to feed self    Patient Positioning Upright in chair    Baseline Vocal Quality Hoarse    Volitional Cough Strong    Volitional Swallow Able to elicit    Anatomy Within functional limits    Pharyngeal Secretions Not observed secondary MBS              Oral Preparation/Oral Phase - 01/24/20 1443      Oral Preparation/Oral Phase   Oral Phase Impaired      Oral - Thin   Oral - Thin Teaspoon --   premature spillage   Oral - Thin Cup --   premature spillage   Oral - Thin Straw Within functional limits      Oral - Solids   Oral - Puree Within functional limits    Oral - Mechanical Soft Within functional limits    Oral - Regular Piecemeal swallowing;Decreased bolus cohesion    Oral - Pill Within functional limits      Electrical stimulation - Oral Phase   Was Electrical Stimulation Used No            Pharyngeal Phase - 01/24/20 1529      Pharyngeal Phase   Pharyngeal Phase Impaired      Pharyngeal - Thin   Pharyngeal- Thin Teaspoon Swallow initiation at pyriform sinus;Penetration/Aspiration before swallow    Pharyngeal Material does not enter airway;Material enters airway, remains ABOVE vocal cords then ejected out;Material enters airway, remains ABOVE vocal cords and not ejected out    Pharyngeal- Thin Cup Swallow initiation at pyriform sinus;Penetration/Aspiration before swallow    Pharyngeal Material does not enter airway;Material enters  airway, remains ABOVE vocal cords then ejected out    Pharyngeal- Thin Straw Swallow initiation at pyriform sinus;Penetration/Aspiration before swallow    Pharyngeal Material does not enter airway;Material enters airway, remains ABOVE vocal cords then ejected out      Pharyngeal - Solids   Pharyngeal- Puree Swallow initiation at vallecula;Within functional limits    Pharyngeal- Mechanical Soft Swallow initiation at vallecula;Within functional limits  Pharyngeal- Regular Reduced tongue base retraction;Pharyngeal residue - valleculae    Pharyngeal- Pill Within functional limits      Pharyngeal Phase - Comment   Pharyngeal Comment mi/mod vallecular residue post swallow of regular textures which was removed with liquid wash      Electrical Stimulation - Pharyngeal Phase   Was Electrical Stimulation Used No            Cricopharyngeal Phase - 01/24/20 1538      Cervical Esophageal Phase   Cervical Esophageal Phase Impaired      Cervical Esophageal Phase - Solids   Pill Esophageal backflow into cervical esophagus      Cervical Esophageal Phase - Comment   Other Esophageal Phase Observations Barium tablet transiently delayed in distal esophagus, retrograde movement noted of standing column of barium              Plan - 01/24/20 1544    Clinical Impression Statement Pt presents with mild oropharyngeal dysphagia characterized by reduced bolus cohesiveness and premature spillage with delay in swallow initiation. Swallow trigger generally after spilling to the pyriforms with liquids and penetration occurred before the swallow (trace amount) and only occasionally small amount of epiglottic coating remained. Pt with piecemeal deglutition with regular textures and reduced bolus cohesiveness and tongue base retraction which resulted in mi/mod vallecular residue. This was not cleared with a head turn L/R, but did clear with liquid wash. Pt swallowed mech soft (added moisture) without incident,  no residuals. Pt swallowed the barium tablet with cups sip thin liquid and the pill was transiently delayed in the distal esophagus which caused restrograde movement of standing column of barium into the cervical esophagus. The pill did eventually pass through the UES. Recommend regular textures and consider adding moisture to dry solids/meats and/or follow with liquid wash. Pt is scheduled to have EGD tomorrow. Consider f/u SLP services if Pt does not have relief in her symptoms.          Patient will benefit from skilled therapeutic intervention in order to improve the following deficits and impairments:   Dysphagia, oropharyngeal phase     Recommendations/Treatment - 01/24/20 1541      Swallow Evaluation Recommendations   Recommended Consults Consider esophageal assessment   Pt scheduled for EGD tomorrow   SLP Diet Recommendations Thin;Age appropriate regular    Liquid Administration via Cup;Straw    Medication Administration Whole meds with liquid    Supervision Patient able to self feed    Compensations Other (Comment)   swallow solids "Fast and hard", liquid wash as needed   Postural Changes Seated upright at 90 degrees;Remain upright for at least 30 minutes after feeds/meals            Prognosis - 01/24/20 1543      Prognosis   Prognosis for Safe Diet Advancement Good      Individuals Consulted   Report Sent to  Referring physician           Problem List Patient Active Problem List   Diagnosis Date Noted  . Respiratory failure (HCC) 06/16/2018  . Opiate overdose (HCC) 06/16/2018  . Hypotension 06/16/2018  . Anxiety and depression 06/16/2018  . Unresponsiveness 06/15/2018  . Melena 03/20/2011  . Right sided abdominal pain 03/20/2011  . DIARRHEA, CHRONIC 06/05/2010  . ABDOMINAL PAIN, UNSPECIFIED SITE 03/13/2010  . GERD 10/09/2009  . Dysphagia 10/09/2009  . PUD, HX OF 10/09/2009  . IRRITABLE BOWEL SYNDROME, HX OF 10/09/2009  . CERVICAL SPASM 09/07/2009  .  IMPINGEMENT SYNDROME 06/20/2009  . HIGH BLOOD PRESSURE 06/20/2009   Thank you,  Havery Moros, CCC-SLP [phone]  [facility] 01/24/2020, 3:51 PM  [place] [address] [place], [place], [address] Phone: [phone]   Fax:  [phone]  Name: ARIELY RIDDELL MRN: [id number] Date of Birth: 1965-09-22

## 2020-01-25 ENCOUNTER — Ambulatory Visit (HOSPITAL_COMMUNITY)
Admission: RE | Admit: 2020-01-25 | Discharge: 2020-01-25 | Disposition: A | Discharge status: Home / Self Care | Payer: Medicaid Other | Attending: Gastroenterology | Admitting: Gastroenterology

## 2020-01-25 ENCOUNTER — Encounter (HOSPITAL_COMMUNITY): Payer: Self-pay | Admitting: Gastroenterology

## 2020-01-25 ENCOUNTER — Ambulatory Visit (HOSPITAL_COMMUNITY): Payer: Medicaid Other | Admitting: Anesthesiology

## 2020-01-25 ENCOUNTER — Encounter (HOSPITAL_COMMUNITY): Payer: Self-pay | Admitting: Anesthesiology

## 2020-01-25 ENCOUNTER — Encounter (HOSPITAL_COMMUNITY)
Admission: RE | Disposition: A | Discharge status: Home / Self Care | Payer: Self-pay | Source: Home / Self Care | Attending: Gastroenterology

## 2020-01-25 ENCOUNTER — Other Ambulatory Visit: Payer: Self-pay

## 2020-01-25 DIAGNOSIS — Z5309 Procedure and treatment not carried out because of other contraindication: Secondary | ICD-10-CM | POA: Diagnosis not present

## 2020-01-25 DIAGNOSIS — R1310 Dysphagia, unspecified: Secondary | ICD-10-CM | POA: Diagnosis not present

## 2020-01-25 DIAGNOSIS — R400 Somnolence: Secondary | ICD-10-CM | POA: Diagnosis not present

## 2020-01-25 HISTORY — PX: ESOPHAGOGASTRODUODENOSCOPY (EGD) WITH PROPOFOL: SHX5813

## 2020-01-25 HISTORY — PX: ESOPHAGEAL DILATION: SHX303

## 2020-01-25 SURGERY — ESOPHAGOGASTRODUODENOSCOPY (EGD) WITH PROPOFOL
Anesthesia: General

## 2020-01-25 MED ORDER — IPRATROPIUM-ALBUTEROL 0.5-2.5 (3) MG/3ML IN SOLN
3.0000 mL | Freq: Four times a day (QID) | RESPIRATORY_TRACT | Status: DC | PRN
  Administered 2020-01-25: 3 mL via RESPIRATORY_TRACT
  Filled 2020-01-25: qty 3

## 2020-01-25 MED ORDER — GLYCOPYRROLATE 0.2 MG/ML IJ SOLN
INTRAMUSCULAR | Status: AC
  Filled 2020-01-25: qty 1

## 2020-01-25 MED ORDER — IPRATROPIUM-ALBUTEROL 0.5-2.5 (3) MG/3ML IN SOLN
RESPIRATORY_TRACT | Status: AC
  Filled 2020-01-25: qty 3

## 2020-01-25 MED ORDER — LACTATED RINGERS IV SOLN: INTRAVENOUS | Status: AC | PRN

## 2020-01-25 MED ORDER — GLYCOPYRROLATE 0.2 MG/ML IJ SOLN
0.2000 mg | Freq: Once | INTRAMUSCULAR | Status: AC
  Administered 2020-01-25: 0.2 mg via INTRAVENOUS

## 2020-01-25 MED ORDER — LIDOCAINE VISCOUS HCL 2 % MT SOLN
15.0000 mL | Freq: Once | OROMUCOSAL | Status: AC
  Administered 2020-01-25: 15 mL via OROMUCOSAL

## 2020-01-25 MED ORDER — LACTATED RINGERS IV SOLN: Freq: Once | INTRAVENOUS | Status: AC

## 2020-01-25 MED ORDER — LIDOCAINE VISCOUS HCL 2 % MT SOLN
OROMUCOSAL | Status: AC
  Filled 2020-01-25: qty 15

## 2020-01-25 NOTE — OR Nursing (Signed)
Patient in for EGD/ED. Patient overly sedated and oxygen saturation 88-91%. Procedure cancelled today. Procedure to be rescheduled for tomorrow if patient comes in alert and a normal oxygen saturation. Dr. Levon Hedger and Dr. Alva Garnet in to speak with patient who verbalized understanding.

## 2020-01-25 NOTE — Anesthesia Preprocedure Evaluation (Addendum)
Anesthesia Evaluation  Patient identified by MRN, date of birth, ID band Patient awake    Reviewed: Allergy & Precautions, NPO status , Patient's Chart, lab work & pertinent test results  History of Anesthesia Complications Negative for: history of anesthetic complications  Airway Mallampati: III  TM Distance: >3 FB Neck ROM: Full    Dental  (+) Missing, Chipped, Dental Advisory Given   Pulmonary shortness of breath and with exertion, COPD,  COPD inhaler, Current Smoker and Patient abstained from smoking.,     + wheezing      Cardiovascular hypertension, Pt. on medications + DOE  Normal cardiovascular exam Rhythm:Regular Rate:Normal     Neuro/Psych Seizures - (not on meds), Well Controlled,  PSYCHIATRIC DISORDERS Anxiety Depression    GI/Hepatic PUD, GERD  Medicated,  Endo/Other  negative endocrine ROS  Renal/GU Renal disease     Musculoskeletal  (+) Arthritis ,   Abdominal   Peds  (+) ADHD Hematology negative hematology ROS (+)   Anesthesia Other Findings   Reproductive/Obstetrics negative OB ROS                             Anesthesia Physical Anesthesia Plan  ASA: III  Anesthesia Plan: General   Post-op Pain Management:    Induction: Intravenous  PONV Risk Score and Plan: TIVA  Airway Management Planned: Nasal Cannula and Natural Airway  Additional Equipment:   Intra-op Plan:   Post-operative Plan:   Informed Consent: I have reviewed the patients History and Physical, chart, labs and discussed the procedure including the risks, benefits and alternatives for the proposed anesthesia with the patient or authorized representative who has indicated his/her understanding and acceptance.     Dental advisory given  Plan Discussed with: CRNA and Surgeon  Anesthesia Plan Comments: (Will give duoneb treatment before the procedure. Lungs clear after the treatment, patient is  very sleep from her antianxiety and pain meds, sats 88 to 92, discussed with the patient, procedure will be postponed, advised not to take her anxiety and pain medication and use inhaler before she comes to the hospital tomorrow, will do the procedure if her pulmonary status is stable. )       Anesthesia Quick Evaluation

## 2020-01-25 NOTE — H&P (Signed)
Procedure cancelled today as patient was extremely somnolent. She reported taking Ativan and buprenorphine today in the AM. Saturation goes down to 87% but improves when she wakes up. Patient will be rescheduled for tomorrow but was advised to not take any of these medicines until her procedure tomorrow.  Katrinka Blazing, MD Gastroenterology and Hepatology [facility]

## 2020-01-26 ENCOUNTER — Other Ambulatory Visit (INDEPENDENT_AMBULATORY_CARE_PROVIDER_SITE_OTHER): Payer: Self-pay | Admitting: Gastroenterology

## 2020-01-26 ENCOUNTER — Encounter (HOSPITAL_COMMUNITY): Admission: RE | Payer: Self-pay | Source: Home / Self Care

## 2020-01-26 ENCOUNTER — Ambulatory Visit (HOSPITAL_COMMUNITY): Admission: RE | Admit: 2020-01-26 | Payer: Medicaid Other | Source: Home / Self Care | Admitting: Gastroenterology

## 2020-01-26 ENCOUNTER — Encounter (INDEPENDENT_AMBULATORY_CARE_PROVIDER_SITE_OTHER): Payer: Self-pay | Admitting: *Deleted

## 2020-01-26 SURGERY — ESOPHAGOGASTRODUODENOSCOPY (EGD) WITH PROPOFOL
Anesthesia: Monitor Anesthesia Care

## 2020-01-26 NOTE — Progress Notes (Signed)
egd with ED

## 2020-01-26 NOTE — Telephone Encounter (Signed)
This encounter was created in error - please disregard.

## 2020-01-31 ENCOUNTER — Encounter (HOSPITAL_COMMUNITY): Payer: Self-pay | Admitting: Gastroenterology

## 2020-01-31 ENCOUNTER — Encounter (HOSPITAL_COMMUNITY): Payer: Self-pay | Admitting: Anesthesiology

## 2020-01-31 ENCOUNTER — Ambulatory Visit: Admitting: Urology

## 2020-01-31 ENCOUNTER — Other Ambulatory Visit (HOSPITAL_COMMUNITY)
Admission: RE | Admit: 2020-01-31 | Discharge: 2020-01-31 | Disposition: A | Discharge status: Home / Self Care | Payer: Medicaid Other | Source: Ambulatory Visit | Attending: Gastroenterology | Admitting: Gastroenterology

## 2020-02-01 ENCOUNTER — Ambulatory Visit (HOSPITAL_COMMUNITY): Admission: RE | Admit: 2020-02-01 | Payer: Medicaid Other | Source: Home / Self Care | Admitting: Gastroenterology

## 2020-02-01 ENCOUNTER — Encounter (HOSPITAL_COMMUNITY): Admission: RE | Payer: Self-pay | Source: Home / Self Care

## 2020-02-01 SURGERY — ESOPHAGOGASTRODUODENOSCOPY (EGD) WITH PROPOFOL
Anesthesia: Monitor Anesthesia Care

## 2020-02-18 ENCOUNTER — Institutional Professional Consult (permissible substitution): Admitting: Internal Medicine

## 2020-03-29 ENCOUNTER — Institutional Professional Consult (permissible substitution): Admitting: Internal Medicine

## 2020-04-24 ENCOUNTER — Other Ambulatory Visit: Payer: Self-pay

## 2020-04-24 ENCOUNTER — Ambulatory Visit (HOSPITAL_COMMUNITY)
Admission: EM | Admit: 2020-04-24 | Discharge: 2020-04-24 | Disposition: A | Discharge status: Home / Self Care | Payer: Medicaid Other

## 2020-07-05 ENCOUNTER — Institutional Professional Consult (permissible substitution): Payer: Medicaid Other | Admitting: Pulmonary Disease

## 2022-02-05 ENCOUNTER — Encounter (INDEPENDENT_AMBULATORY_CARE_PROVIDER_SITE_OTHER): Payer: Self-pay | Admitting: *Deleted

## 2022-03-21 ENCOUNTER — Ambulatory Visit (INDEPENDENT_AMBULATORY_CARE_PROVIDER_SITE_OTHER): Payer: Medicaid Other | Admitting: Gastroenterology

## 2022-05-30 ENCOUNTER — Ambulatory Visit (INDEPENDENT_AMBULATORY_CARE_PROVIDER_SITE_OTHER): Payer: Medicaid Other | Admitting: Gastroenterology

## 2022-06-20 ENCOUNTER — Ambulatory Visit (INDEPENDENT_AMBULATORY_CARE_PROVIDER_SITE_OTHER): Payer: Medicaid Other | Admitting: Gastroenterology

## 2022-06-20 ENCOUNTER — Encounter (INDEPENDENT_AMBULATORY_CARE_PROVIDER_SITE_OTHER): Payer: Self-pay | Admitting: Gastroenterology

## 2022-06-21 ENCOUNTER — Encounter (INDEPENDENT_AMBULATORY_CARE_PROVIDER_SITE_OTHER): Payer: Self-pay | Admitting: *Deleted

## 2022-07-01 ENCOUNTER — Emergency Department (HOSPITAL_COMMUNITY)
Admission: EM | Admit: 2022-07-01 | Discharge: 2022-07-02 | Discharge status: Against Medical Advice | Payer: Medicaid Other | Attending: Emergency Medicine | Admitting: Emergency Medicine

## 2022-07-01 DIAGNOSIS — K5900 Constipation, unspecified: Secondary | ICD-10-CM | POA: Insufficient documentation

## 2022-07-01 DIAGNOSIS — Z5321 Procedure and treatment not carried out due to patient leaving prior to being seen by health care provider: Secondary | ICD-10-CM | POA: Insufficient documentation

## 2022-07-01 DIAGNOSIS — R1084 Generalized abdominal pain: Secondary | ICD-10-CM | POA: Diagnosis present

## 2022-07-01 DIAGNOSIS — R110 Nausea: Secondary | ICD-10-CM | POA: Diagnosis not present

## 2022-07-01 NOTE — ED Triage Notes (Signed)
Patient here for evaluation of intermittent abdominal pain that started a couple of months ago and is accompanied by nausea but no vomiting. Patient is alert, oriented, and in no apparent distress at this time.

## 2022-07-01 NOTE — ED Provider Triage Note (Signed)
Emergency Medicine Provider Triage Evaluation Note  Yolanda Adams , a 57 y.o. female  was evaluated in triage.  Pt complains of generalized abdominal pain that been ongoing for several months.  Has associated constipation.  Notes that she has had hard bowel movements.  Has associated nausea.  Denies urinary symptoms or vomiting.  Last bowel movement was today.  Patient takes Subutex.   Review of Systems  Positive:  Negative:   Physical Exam  BP (!) 159/75   Pulse 91   Temp 98 F (36.7 C) (Oral)   Resp 20   LMP 03/30/2011   SpO2 93%  Gen:   Awake, no distress   Resp:  Normal effort  MSK:   Moves extremities without difficulty  Other:  Generalized abdominal tenderness to palpation.  Bilateral CVA tenderness, left greater than right.  Medical Decision Making  Medically screening exam initiated at 5:05 PM.  Appropriate orders placed.  WANISHA DELFOSSE was informed that the remainder of the evaluation will be completed by another provider, this initial triage assessment does not replace that evaluation, and the importance of remaining in the ED until their evaluation is complete.  Work-up initiated.    Rodell Marrs A, PA-C 07/01/22 1713

## 2023-03-17 DIAGNOSIS — R0602 Shortness of breath: Secondary | ICD-10-CM | POA: Diagnosis not present

## 2023-03-17 DIAGNOSIS — Z720 Tobacco use: Secondary | ICD-10-CM | POA: Diagnosis not present

## 2023-03-17 DIAGNOSIS — J441 Chronic obstructive pulmonary disease with (acute) exacerbation: Secondary | ICD-10-CM | POA: Diagnosis not present

## 2023-03-17 DIAGNOSIS — Z20822 Contact with and (suspected) exposure to covid-19: Secondary | ICD-10-CM | POA: Diagnosis not present

## 2023-03-17 DIAGNOSIS — R079 Chest pain, unspecified: Secondary | ICD-10-CM | POA: Diagnosis not present

## 2023-06-04 ENCOUNTER — Telehealth: Payer: Self-pay

## 2023-06-04 ENCOUNTER — Institutional Professional Consult (permissible substitution): Payer: Medicaid Other | Admitting: Student in an Organized Health Care Education/Training Program

## 2023-06-04 NOTE — Telephone Encounter (Signed)
Per Secure chat from Dr. Danella Maiers is the fourth time this patient has no showed with someone in our group (twice with ramaswamy, once with laura, and now with me). I think that's grounds for dismissal from the group.  Letter has been printed, signed and mailed to the patient.  Fredric Mare can you mark it in her chart? Thank you.

## 2023-06-05 NOTE — Telephone Encounter (Signed)
Patient has been marked as dismissed
